# Patient Record
Sex: Male | Born: 1960 | Race: White | Hispanic: No | Marital: Married | Smoking: Former smoker
Health system: Southern US, Community
[De-identification: ages and names within clinical notes are randomized; demographics above are authoritative.]

## PROBLEM LIST (undated history)

## (undated) DIAGNOSIS — M5489 Other dorsalgia: Secondary | ICD-10-CM

## (undated) DIAGNOSIS — N189 Chronic kidney disease, unspecified: Secondary | ICD-10-CM

## (undated) DIAGNOSIS — I10 Essential (primary) hypertension: Secondary | ICD-10-CM

## (undated) DIAGNOSIS — Z9289 Personal history of other medical treatment: Secondary | ICD-10-CM

## (undated) DIAGNOSIS — E785 Hyperlipidemia, unspecified: Secondary | ICD-10-CM

## (undated) DIAGNOSIS — R202 Paresthesia of skin: Secondary | ICD-10-CM

## (undated) DIAGNOSIS — J189 Pneumonia, unspecified organism: Secondary | ICD-10-CM

## (undated) DIAGNOSIS — Z8709 Personal history of other diseases of the respiratory system: Secondary | ICD-10-CM

## (undated) DIAGNOSIS — K5790 Diverticulosis of intestine, part unspecified, without perforation or abscess without bleeding: Secondary | ICD-10-CM

## (undated) DIAGNOSIS — R197 Diarrhea, unspecified: Secondary | ICD-10-CM

## (undated) DIAGNOSIS — D649 Anemia, unspecified: Secondary | ICD-10-CM

## (undated) DIAGNOSIS — D126 Benign neoplasm of colon, unspecified: Secondary | ICD-10-CM

## (undated) DIAGNOSIS — C4490 Unspecified malignant neoplasm of skin, unspecified: Secondary | ICD-10-CM

## (undated) DIAGNOSIS — T827XXA Infection and inflammatory reaction due to other cardiac and vascular devices, implants and grafts, initial encounter: Secondary | ICD-10-CM

## (undated) DIAGNOSIS — Z940 Kidney transplant status: Secondary | ICD-10-CM

## (undated) DIAGNOSIS — T8612 Kidney transplant failure: Secondary | ICD-10-CM

## (undated) DIAGNOSIS — M1030 Gout due to renal impairment, unspecified site: Secondary | ICD-10-CM

## (undated) DIAGNOSIS — R0602 Shortness of breath: Secondary | ICD-10-CM

## (undated) DIAGNOSIS — Z8739 Personal history of other diseases of the musculoskeletal system and connective tissue: Secondary | ICD-10-CM

## (undated) HISTORY — DX: Diverticulosis of intestine, part unspecified, without perforation or abscess without bleeding: K57.90

## (undated) HISTORY — DX: Infection and inflammatory reaction due to other cardiac and vascular devices, implants and grafts, initial encounter: T82.7XXA

## (undated) HISTORY — DX: Other dorsalgia: M54.89

## (undated) HISTORY — DX: Unspecified malignant neoplasm of skin, unspecified: C44.90

## (undated) HISTORY — DX: Chronic kidney disease, unspecified: N18.9

## (undated) HISTORY — DX: Kidney transplant failure: T86.12

## (undated) HISTORY — DX: Benign neoplasm of colon, unspecified: D12.6

## (undated) HISTORY — DX: Essential (primary) hypertension: I10

## (undated) HISTORY — PX: AV FISTULA PLACEMENT: SHX1204

---

## 1997-10-31 ENCOUNTER — Ambulatory Visit (HOSPITAL_COMMUNITY): Admission: RE | Admit: 1997-10-31 | Discharge: 1997-10-31 | Payer: Self-pay | Admitting: Cardiology

## 2003-02-12 ENCOUNTER — Ambulatory Visit (HOSPITAL_BASED_OUTPATIENT_CLINIC_OR_DEPARTMENT_OTHER): Admission: RE | Admit: 2003-02-12 | Discharge: 2003-02-12 | Payer: Self-pay | Admitting: Otolaryngology

## 2008-05-18 ENCOUNTER — Encounter: Admission: RE | Admit: 2008-05-18 | Discharge: 2008-05-18 | Payer: Self-pay | Admitting: Nephrology

## 2008-05-24 ENCOUNTER — Ambulatory Visit: Payer: Self-pay | Admitting: Vascular Surgery

## 2008-05-27 ENCOUNTER — Ambulatory Visit (HOSPITAL_COMMUNITY): Admission: RE | Admit: 2008-05-27 | Discharge: 2008-05-27 | Payer: Self-pay | Admitting: Vascular Surgery

## 2008-05-27 ENCOUNTER — Ambulatory Visit: Payer: Self-pay | Admitting: Vascular Surgery

## 2008-06-07 ENCOUNTER — Encounter: Admission: RE | Admit: 2008-06-07 | Discharge: 2008-06-07 | Payer: Self-pay | Admitting: Nephrology

## 2008-07-05 ENCOUNTER — Ambulatory Visit: Payer: Self-pay | Admitting: Vascular Surgery

## 2008-07-27 ENCOUNTER — Encounter (HOSPITAL_COMMUNITY): Admission: RE | Admit: 2008-07-27 | Discharge: 2008-10-25 | Payer: Self-pay | Admitting: Nephrology

## 2008-09-01 ENCOUNTER — Ambulatory Visit (HOSPITAL_COMMUNITY): Admission: RE | Admit: 2008-09-01 | Discharge: 2008-09-01 | Payer: Self-pay | Admitting: Nephrology

## 2008-10-13 ENCOUNTER — Encounter: Admission: RE | Admit: 2008-10-13 | Discharge: 2008-10-13 | Payer: Self-pay | Admitting: General Surgery

## 2008-11-01 ENCOUNTER — Ambulatory Visit (HOSPITAL_COMMUNITY): Admission: RE | Admit: 2008-11-01 | Discharge: 2008-11-01 | Payer: Self-pay | Admitting: Nephrology

## 2009-01-08 HISTORY — PX: HERNIA REPAIR: SHX51

## 2009-01-08 HISTORY — PX: APPENDECTOMY: SHX54

## 2009-01-26 ENCOUNTER — Encounter (INDEPENDENT_AMBULATORY_CARE_PROVIDER_SITE_OTHER): Payer: Self-pay | Admitting: General Surgery

## 2009-01-26 ENCOUNTER — Ambulatory Visit (HOSPITAL_COMMUNITY): Admission: RE | Admit: 2009-01-26 | Discharge: 2009-01-27 | Payer: Self-pay | Admitting: General Surgery

## 2009-04-10 ENCOUNTER — Encounter: Admission: RE | Admit: 2009-04-10 | Discharge: 2009-04-10 | Payer: Self-pay | Admitting: Nephrology

## 2009-04-10 HISTORY — PX: KIDNEY TRANSPLANT: SHX239

## 2009-04-17 ENCOUNTER — Encounter: Admission: RE | Admit: 2009-04-17 | Discharge: 2009-04-17 | Payer: Self-pay | Admitting: Nephrology

## 2010-09-15 LAB — DIFFERENTIAL
Lymphocytes Relative: 22 % (ref 12–46)
Lymphs Abs: 1.7 10*3/uL (ref 0.7–4.0)
Monocytes Relative: 14 % — ABNORMAL HIGH (ref 3–12)
Neutro Abs: 4.4 10*3/uL (ref 1.7–7.7)
Neutrophils Relative %: 58 % (ref 43–77)

## 2010-09-15 LAB — POCT I-STAT 4, (NA,K, GLUC, HGB,HCT)
Glucose, Bld: 108 mg/dL — ABNORMAL HIGH (ref 70–99)
HCT: 38 % — ABNORMAL LOW (ref 39.0–52.0)
Hemoglobin: 12.9 g/dL — ABNORMAL LOW (ref 13.0–17.0)
Potassium: 4.1 mEq/L (ref 3.5–5.1)
Sodium: 135 mEq/L (ref 135–145)

## 2010-09-15 LAB — BASIC METABOLIC PANEL
BUN: 47 mg/dL — ABNORMAL HIGH (ref 6–23)
BUN: 63 mg/dL — ABNORMAL HIGH (ref 6–23)
Calcium: 8.9 mg/dL (ref 8.4–10.5)
Chloride: 101 mEq/L (ref 96–112)
Chloride: 99 mEq/L (ref 96–112)
Creatinine, Ser: 7.2 mg/dL — ABNORMAL HIGH (ref 0.4–1.5)
Creatinine, Ser: 8.58 mg/dL — ABNORMAL HIGH (ref 0.4–1.5)
GFR calc Af Amer: 10 mL/min — ABNORMAL LOW (ref >60)
GFR calc Af Amer: 8 mL/min — ABNORMAL LOW (ref >60)
GFR calc non Af Amer: 7 mL/min — ABNORMAL LOW (ref >60)
GFR calc non Af Amer: 8 mL/min — ABNORMAL LOW (ref >60)

## 2010-09-15 LAB — CBC
Platelets: 270 10*3/uL (ref 150–400)
RBC: 3.46 MIL/uL — ABNORMAL LOW (ref 4.22–5.81)
WBC: 7.6 10*3/uL (ref 4.0–10.5)

## 2010-09-20 LAB — RENAL FUNCTION PANEL
Albumin: 3.2 g/dL — ABNORMAL LOW (ref 3.5–5.2)
Phosphorus: 6.6 mg/dL — ABNORMAL HIGH (ref 2.3–4.6)
Potassium: 4.6 mEq/L (ref 3.5–5.1)
Sodium: 139 mEq/L (ref 135–145)

## 2010-09-20 LAB — PROTIME-INR
INR: 1 (ref 0.00–1.49)
Prothrombin Time: 13.2 seconds (ref 11.6–15.2)

## 2010-09-20 LAB — BASIC METABOLIC PANEL
CO2: 22 mEq/L (ref 19–32)
Calcium: 9 mg/dL (ref 8.4–10.5)
Creatinine, Ser: 8.19 mg/dL — ABNORMAL HIGH (ref 0.4–1.5)
GFR calc Af Amer: 9 mL/min — ABNORMAL LOW (ref >60)
GFR calc non Af Amer: 7 mL/min — ABNORMAL LOW (ref >60)

## 2010-09-20 LAB — CBC
MCHC: 33.4 g/dL (ref 30.0–36.0)
RBC: 3.28 MIL/uL — ABNORMAL LOW (ref 4.22–5.81)

## 2010-09-20 LAB — APTT: aPTT: 30 seconds (ref 24–37)

## 2010-09-20 LAB — PTH, INTACT AND CALCIUM
Calcium, Total (PTH): 8.9 mg/dL (ref 8.4–10.5)
PTH: 152.9 pg/mL — ABNORMAL HIGH (ref 14.0–72.0)

## 2010-09-20 LAB — POCT HEMOGLOBIN-HEMACUE: Hemoglobin: 10 g/dL — ABNORMAL LOW (ref 13.0–17.0)

## 2010-09-20 LAB — IRON AND TIBC: TIBC: 286 ug/dL (ref 215–435)

## 2010-09-25 LAB — POCT HEMOGLOBIN-HEMACUE: Hemoglobin: 9 g/dL — ABNORMAL LOW (ref 13.0–17.0)

## 2010-10-23 NOTE — Op Note (Signed)
Mario Woods, Mario Woods                 ACCOUNT NO.:  [id number]   MEDICAL RECORD NO.:  [id number]          PATIENT TYPE:  AMB   LOCATION:  SDS                          FACILITY:  [facility]   PHYSICIAN:  Judeth Cornfield. Scot Dock, M.D.DATE OF BIRTH:  February 24, 1961   DATE OF PROCEDURE:  05/27/2008  DATE OF DISCHARGE:  05/27/2008                               OPERATIVE REPORT   PREOPERATIVE DIAGNOSIS:  End-stage renal disease.   POSTOPERATIVE DIAGNOSIS:  End-stage renal disease.   PROCEDURE:  Placement of a new left forearm arteriovenous fistula.   SURGEON:  Judeth Cornfield. Scot Dock, MD   ASSISTANT:  Chad Cordial, PA.   ANESTHESIA:  Local.   TECHNIQUE:  The patient was taken to the operating room, sedated by  Anesthesia.  The left upper extremity was prepped and draped in the  usual sterile fashion.  After the skin was infiltrated with 1%  lidocaine, a longitudinal incision was made over the cephalic vein at  the wrist and was somewhat lateral.  The vein was ligated distally,  irrigated up with heparinized saline, was a 3.5 mm vein.  Next, through  a separate longitudinal incision as again the vein was fairly far  lateral, the radial artery was dissected free.  This was a somewhat  small, but it was felt to be adequate.  Therefore, the vein was tunneled  between the 2 incisions.  The patient was heparinized.  The radial  artery was clamped proximally and distally and a longitudinal  arteriotomy was made.  The vein was sewn end-to-side to the artery using  continuous 6-0 Prolene suture.  At the completion, there was a good  thrill in the fistula at the wrist.  Hemostasis was obtained in the  wounds.  The wounds were closed with a deep layer of 3-0 Vicryl, the  skin closed with 4-0 Vicryl.  Sterile dressing was applied.  The patient  tolerated the procedure well, was transferred to the recovery room in  satisfactory condition.  All needle and sponge counts were correct.      Judeth Cornfield.  Scot Dock, M.D.  Electronically Signed     CSD/MEDQ  D:  05/27/2008  T:  05/27/2008  Job:  [id number]

## 2010-10-23 NOTE — Assessment & Plan Note (Signed)
OFFICE VISIT   [name], Mario  DOB:  02-Nov-1960                                       07/05/2008  CHART#:[id number]   I saw the patient in the office today for followup after placement of a  left forearm AV fistula on 05/27/2008.  He comes in for a routine wound  check.  He has had no problems since his surgery.  His incision is  healing well and he has an excellent thrill and the left forearm AV  fistula appears to be maturing nicely.  I think this should be ready to  use if and when he needs it for dialysis.  We will see him back p.r.n.   Judeth Cornfield. Scot Woods, M.D.  Electronically Signed   CSD/MEDQ  D:  07/05/2008  T:  07/06/2008  Job:  [id number]

## 2010-10-23 NOTE — Op Note (Signed)
Mario Woods, DAVISON NO.:  [id number]   MEDICAL RECORD NO.:  [id number]          PATIENT TYPE:  OIB   LOCATION:  [id number]                         FACILITY:  [facility]   PHYSICIAN:  Dickey Gave, MDDATE OF BIRTH:  1960-08-10   DATE OF PROCEDURE:  01/26/2009  DATE OF DISCHARGE:                               OPERATIVE REPORT   PREOPERATIVE DIAGNOSES:  1. Ventral hernia.  2. Dilated appendix on CT scan.   POSTOPERATIVE DIAGNOSES:  1. Supraumbilical ventral hernia.  2. 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   PROCEDURE:  1. Diagnostic laparoscopy.  2. Laparoscopic appendectomy.  3. Primary ventral hernia repair x2.   SURGEON:  Dickey Gave, MD   ASSISTANT:  None.   ANESTHESIA:  General.   FINDINGS:  Dilated appendix especially in the distal one-third.   ESTIMATED BLOOD LOSS:  Minimal.   COMPLICATIONS:  None.   SUPERVISING ANESTHESIOLOGIST:  Crissie Sickles. Conrad O[place], MD   DISPOSITION:  To recovery room in stable condition.   INDICATIONS:  Mr. 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  He has been dialyzed  through the left forearm AV fistula.  He also has a 3- to 4-year history  of a supraumbilical bulge that has increased in size over this time.  He  also has a history of some nausea, vomiting, and significant pain at  this site, as well as a possible episode of incarceration.  It was  difficult to tell where this area was exactly as well as a diastasis  recti.  So, I sent him for a CT scan to evaluate this area.  He indeed  had a supraumbilical ventral hernia with a neck measuring about 2-3 cm  but also had a dilated appendix to 14 mm concerning for a mucocele.  He  also has a history of an expansile lesion at left sacrum noted on the  scan that has been evaluated by Dr. Josefa Half at [facility] for the  last 10 years as well.  He is currently undergoing a transplant  evaluation.   He, I had him scheduled previously for surgery and then I  have called him to return due to concern about his appendix.  On December 29, 2008, we scheduled him for a diagnostic laparoscopy, possible  appendectomy versus ileocecectomy and a ventral hernia repair.  He and I  discussed the ventral hernia repair and it may very well be done  primarily with biologic mesh giving him a much higher recurrence rate  but did not want to increase the infectious rate given the bowel surgery  that would be ongoing at the same time as well.   PROCEDURE:  After informed consent was obtained, the patient was taken  to the operating room.  He had sequential compression devices placed on  his lower extremities prior to operation.  Cefazolin 1 gram was  administered prior to the procedure.  He was then placed under general  endotracheal anesthesia without complication.  He did have a Foley  catheter placed  just to decompress his bladder for the ports and there  was return of urine from this.  His abdomen was prepped with ChloraPrep.  Three minutes was allowed to pass.  He was then draped in a standard  sterile surgical fashion.  Surgical time-out was then performed.   Due to the position of his hernia, I made a vertical incision above the  umbilicus and carried out down to the level of the umbilical stalk.  I  then grasped this with a Kocher clamp, made an incision in the fascia  and entered the peritoneum bluntly.  A 0 Vicryl pursestring suture was  placed through the fascia.  A Hasson trocar was then introduced into the  abdomen without complication. I then insufflated his abdomen to 15 mmHg  pressure.  Two further 5-mm ports were placed in the right upper  quadrant as well as in the suprapubic position under direct vision  without complication after infiltration with local anesthetic.  I then  looked to evaluate his right lower quadrant, terminal ileum and cecum  both appeared normal.  His appendix was   stuck in the right lower  quadrant.  I was able to manipulate this laparoscopically without  rupturing or grasping the appendix during this portion of the procedure.  I was able to identify the base, and I encircled this with a Air traffic controller.  I then inserted a GIA stapler through the 123456 umbilical  port and removed the appendix as well as a small portion of the cecum  where my staple line was.  I took two staple fires to remove this.  Following this, I then used the vascular loading twice to come across  the mesentery.  There was a small amount of bleeding in the mesentery  that was controlled with a clip.  The appendix was then placed in a bag  and removed down the umbilical incision.  I looked at this on the back  table, and this certainly did appear to be a mucocele.  I changed my  gloves following this portion of the procedure and then returned to the  table.  I had this evaluated by Dr. Alinda Sierras of Pathology who agreed  this was likely a cystic mucinous neoplasm.  I elected to just wait for  the permanent pathology and proceed with just performing the  appendectomy at this time.  Irrigation was performed.  Hemostasis was  observed at this position.  I then turned my attention to the ventral  hernias.  I enlarged this incision first with Hasson trocar.  He had two  ventral hernias, one was about 2.5-cm in size and one was about a 1.5 cm  in size.  These were separate and were both in the supraumbilical  position.  I then was hesitant to place a mesh given the appendectomy as  well as a variety of other factors, so I mobilized these areas and  brought them both together primarily with a #1 Ethibond suture.  The  Hasson trocar site was closed with the Vicryl suture.  An additional  Vicryl figure-of-eight suture was placed through this as well.  I then  reinsufflated the abdomen, and there was no evidence of any injury where  I fixed this hernia and no bowel entrapped as well  as this appeared to  be an adequate hernia repair.  I then desufflated his abdomen and  removed all my trocars.  His skin incisions were all closed with a 4-0  Monocryl  in a subcuticular fashion.  His Foley catheter was removed.  He  tolerated this well was extubated in the operating room and was  transferred to the recovery room in stable condition.      Dickey Gave, MD  Electronically Signed     MCW/MEDQ  D:  01/26/2009  T:  01/27/2009  Job:  [id number]   cc:   Elzie Rings. Lorrene Reid, M.D.

## 2010-10-23 NOTE — Procedures (Signed)
CEPHALIC VEIN MAPPING   INDICATION:  Preop AVF map.   HISTORY:  Chronic kidney disease.   EXAM:  The right cephalic vein is not evaluated.   The left cephalic vein is compressible.   Diameter measurements range from 0.35 cm to 0.86 cm.   See attached worksheet for all measurements.   IMPRESSION:  Patent left cephalic vein which is of acceptable diameter  for use as a dialysis access site.   ___________________________________________  Judeth Cornfield. Scot Dock, M.D.   AS/MEDQ  D:  05/24/2008  T:  05/24/2008  Job:  [id number]

## 2010-10-23 NOTE — Consult Note (Signed)
VASCULAR SURGERY CONSULTATION   Mario Woods, Mario Woods  DOB:  1961-03-06                                       05/24/2008  CHART#:[id number]   I saw the patient in the office today in consultation for hemodialysis  access.  He was referred by Dr. Lorrene Reid.  This is a pleasant 50 year old  gentleman with end-stage renal disease secondary to IgA nephropathy.  It  is felt that he will likely need dialysis in the near future and we were  asked to evaluate him for access.  Of note, he is right-handed.  He has  had no recent uremic symptoms.  Specifically he denies any nausea,  vomiting or significant shortness of breath.   PAST MEDICAL HISTORY:  His past medical history is significant for his  IgA nephropathy, hypertension and dyslipidemia.  He also has a history  of gout.   REVIEW OF SYSTEMS AND MEDICATIONS:  His review of systems and  medications are documented on the medical history form in his chart.   PHYSICAL EXAMINATION:  General:  This is a pleasant 50 year old  gentleman who appears his stated age.  Vital signs:  His blood pressure  is 116/77, heart rate is 68.  Lungs:  Are clear bilaterally to  auscultation.  Cardiac:  He has a regular rate and rhythm.  He has a  palpable brachial and radial pulse bilaterally.   He did undergo vein mapping in our office today which shows a reasonable  forearm and upper arm cephalic vein in the left arm.   I have recommended we place a left forearm AV fistula.  If this is not  adequate then we would place an upper arm fistula.  If neither are  adequate we will place an AV graft.  I have discussed the indications  for surgery and the potential complications including but not limited to  bleeding, wound healing problems, failure of the fistula to mature,  graft thrombosis, graft infection and steal syndrome.  All his questions  were answered.  He is agreeable to proceed and his surgery has been  scheduled for this Friday,  05/27/2008.   Judeth Cornfield. Scot Dock, M.D.  Electronically Signed  CSD/MEDQ  D:  05/24/2008  T:  05/25/2008  Job:  [id number]   cc:   Elzie Rings. Lorrene Reid, M.D.

## 2011-01-30 IMAGING — XA IR FLUORO GUIDE CV LINE*R*
1 series · 2 of 2 positions shown · non-contrast
Comparison: none

Clinical Data/Indication: HD CATH PLACEMENT. .

TUNNELED DIALYSIS CATHETER PLACEMENT, ULTRASOUND GUIDANCE FOR
VASCULAR ACCESS
Sedation: Versed 2.5 mg, Fentanyl 125 mcg.
Total Moderate Sedation Time: 17 minutes.
Fluoroscopy Time: 1.6 minutes.
Procedure: The procedure, risks, benefits, and alternatives were
explained to the patient. Questions regarding the procedure were
encouraged and answered. The patient understands and consents to
the procedure.
The right neck was prepped withbetadine in a sterile fasion, and a
sterile drape was applied covering the operative field. A sterile
gown and sterile gloves were used for the procedure. 1% lidocaine
into the skin and subcutaneous tissue.  Under sonographic guidance,
a micropuncture needle was inserted into the right IJ vein, which
was noted to be patent.  It was removed over an 018 wire which was
upsized to an Amplatz.  This was advanced into the IVC.
A small incision was made in the right upper chest.  The tunneling
device was utilized to advance the86 centimeter tip to cuff
catheter from the chest incision and out the neck incision.  A peel-
away sheath was advanced over the Amplatz wire.  The leading edge
of the catheter was then advanced through the peel-away sheath.
The peel-away sheath was removed.  It was flushed and instilled
with heparin.  The chest incision was closed with an 0 Prolene
pursestring stitch.  The neck incision was closed with a 4-0 Vicryl
subcuticular stitch.  No complications.

[Series 1: run · 2 of 2 slices shown]
[im 1/2]
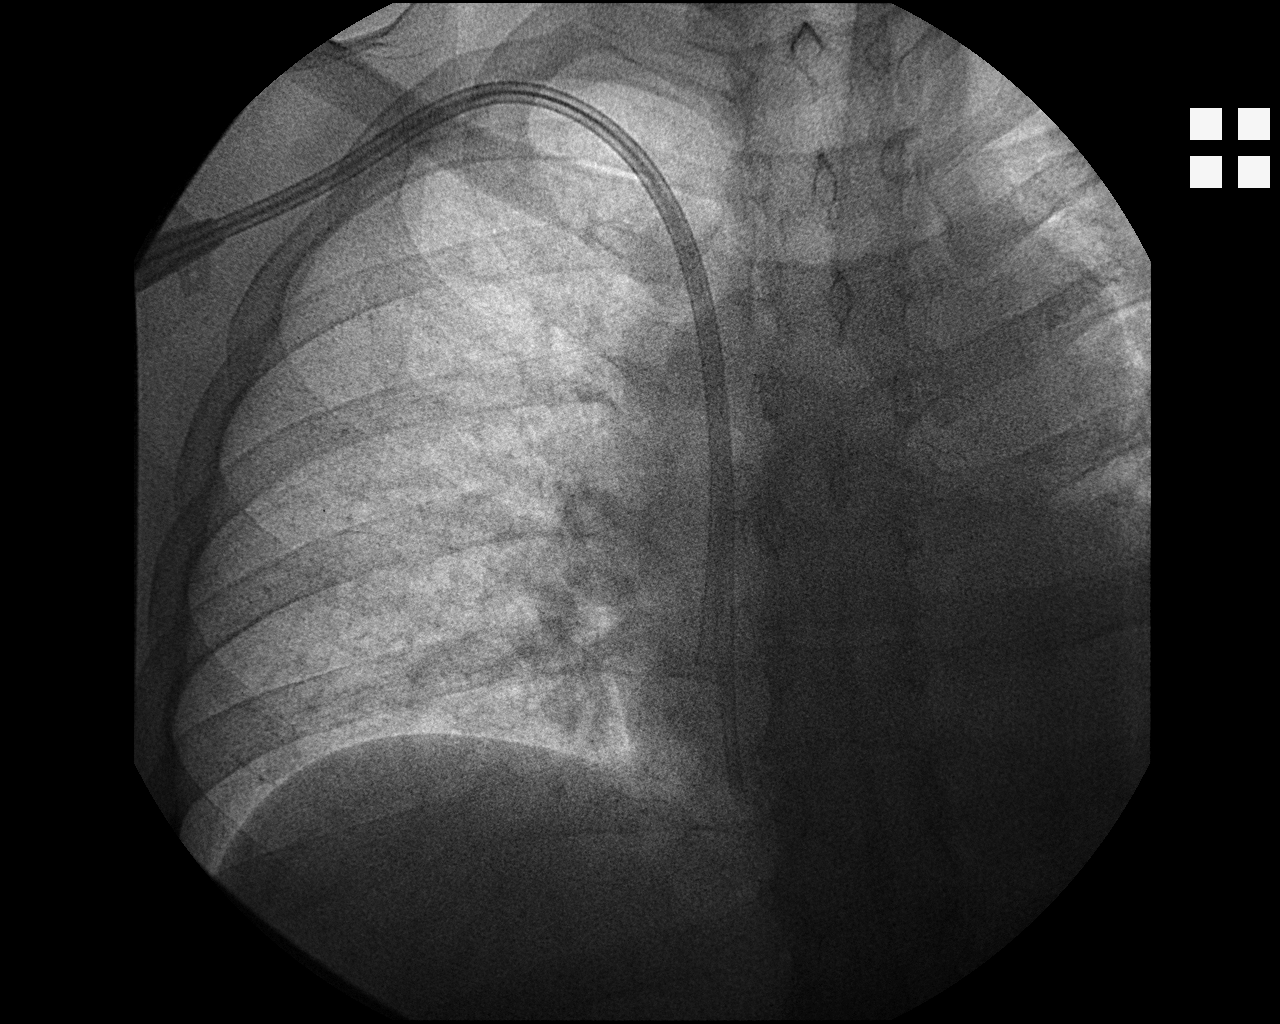
[im 2/2]
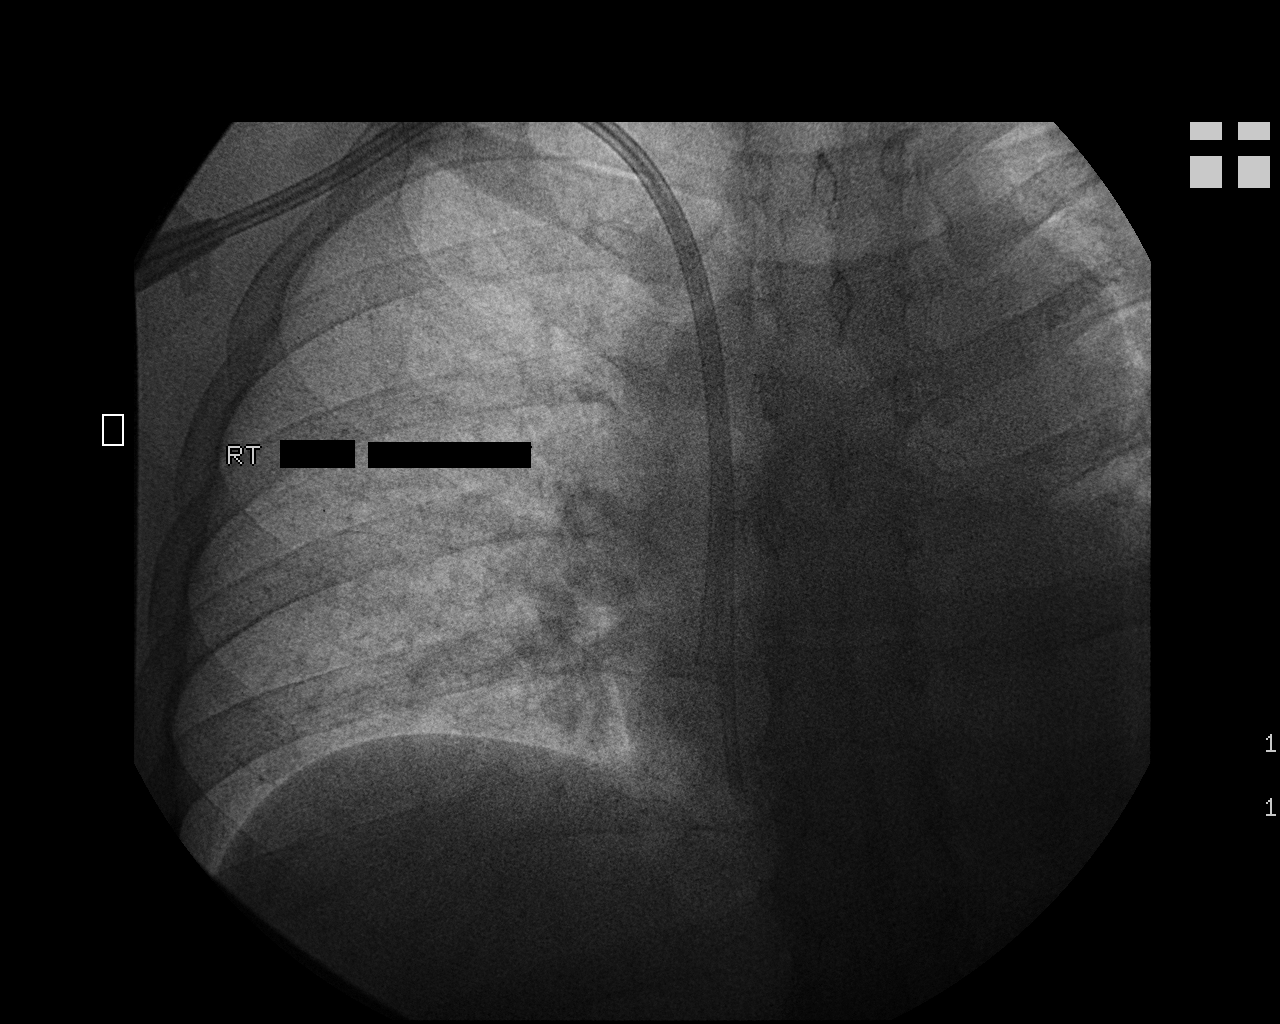

[2 of 2 positions shown; findings below may reference images not displayed]

FINDINGS: The image demonstrates placement of a tunneled dialysis
catheter with its tip in the right atrium.
IMPRESSION: Successful right IJ vein tunneled dialysis catheter with its tip in
the right atrium.

## 2011-08-15 ENCOUNTER — Encounter (INDEPENDENT_AMBULATORY_CARE_PROVIDER_SITE_OTHER): Payer: Self-pay | Admitting: General Surgery

## 2011-08-15 ENCOUNTER — Ambulatory Visit (INDEPENDENT_AMBULATORY_CARE_PROVIDER_SITE_OTHER): Payer: BC Managed Care – PPO | Admitting: General Surgery

## 2011-08-15 VITALS — BP 148/82 | HR 76 | Resp 20 | Ht 77.5 in | Wt 285.0 lb

## 2011-08-15 DIAGNOSIS — K432 Incisional hernia without obstruction or gangrene: Secondary | ICD-10-CM

## 2011-08-15 NOTE — Progress Notes (Signed)
Patient ID: Mario Woods., male   DOB: [birth year], 51 y.o.   MRN: [id number]  Chief Complaint  Patient presents with  . Umbilical Hernia    HPI Mario Woods. is a 51 y.o. male.   HPI This is a 51 year old male who I know well from 2010. At that point in time he had end-stage renal disease and was being considered for a transplant. I then took him to the operating room as he had an appendiceal mass and did a laparoscopic appendectomy which showed a benign appendix with focal dilatation of the lumen with associated mucin. There was no definite neoplasm identified. At that time I also repaired 2 ventral hernias in his abdominal wall with suture as I did not want to place mesh at that same time. Since then he has received a renal transplant in November of 2010 at [facility] for which he is doing very well. He now is back to work. His kidney is given him no problems since then. He about a year and a half ago had return of the bulge near his prior ventral hernia repair that has gotten bigger over this time. He is having normal bowel movements and has no nausea or vomiting associated with this. It does bother him and give him some discomfort if he overeats or when he exerts himself. He presents again today to discuss repair.  Past Medical History  Diagnosis Date  . Hypertension   . Kidney disease     Past Surgical History  Procedure Date  . Appendectomy 01/2009  . Kidney transplant 04/2009  . Hernia repair 01/2009    primary ventral hernia repair x2     Family History  Problem Relation Age of Onset  . Pancreatic cancer      Social History History  Substance Use Topics  . Smoking status: Never Smoker   . Smokeless tobacco: Not on file  . Alcohol Use: Yes     occasionally    No Known Allergies  Current Outpatient Prescriptions  Medication Sig Dispense Refill  . carvedilol (COREG) 12.5 MG tablet Take 12.5 mg by mouth 2 (two) times daily with a meal.      . mycophenolate  (CELLCEPT) 250 MG capsule Take by mouth 2 (two) times daily.      . tacrolimus (PROGRAF) 1 MG capsule Take 1 mg by mouth 2 (two) times daily.        Review of Systems Review of Systems  Constitutional: Negative for fever, chills and unexpected weight change.  HENT: Negative for hearing loss, congestion, sore throat, trouble swallowing and voice change.   Eyes: Negative for visual disturbance.  Respiratory: Negative for cough and wheezing.   Cardiovascular: Negative for chest pain, palpitations and leg swelling.  Gastrointestinal: Positive for abdominal pain and abdominal distention. Negative for nausea, vomiting, diarrhea, constipation, blood in stool, anal bleeding and rectal pain.  Genitourinary: Negative for hematuria and difficulty urinating.  Musculoskeletal: Negative for arthralgias.  Skin: Negative for rash and wound.  Neurological: Negative for seizures, syncope, weakness and headaches.  Hematological: Negative for adenopathy. Does not bruise/bleed easily.  Psychiatric/Behavioral: Negative for confusion.    Blood pressure 148/82, pulse 76, resp. rate 20, height 6' 5.5" (1.969 m), weight 285 lb (129.275 kg).  Physical Exam Physical Exam  Vitals reviewed. Constitutional: He appears well-developed and well-nourished.  Neck: Neck supple.  Cardiovascular: Normal rate, regular rhythm and normal heart sounds.   Pulmonary/Chest: Effort normal and breath sounds normal. He has  no wheezes. He has no rales.  Abdominal: Soft. Bowel sounds are normal. There is no tenderness.    Lymphadenopathy:    He has no cervical adenopathy.      Assessment    Recurrent ventral hernia    Plan   I am not surprised that he has a recurrent ventral hernia. I discussed with him today what I recommended being a laparoscopic ventral hernia repair with mesh. I told him I was comfortable repairing this in [place] as long as I had discussed this with his transplant physicians for perioperative  recommendations. I discussed the lateral hernia with mesh. We discussed the surgery, postoperative course, and hospital stay associated with this. We discussed at length the risks including bleeding, infection, recurrent hernia, injury to bowel or other structures necessitating not fixing this hernia at the same time, prolonged ileus, as well as problems with his transplanted kidney. I will await to hear back from his transplant physicians and scheduling.        Mario Woods 08/15/2011, 9:47 AM

## 2011-09-18 ENCOUNTER — Other Ambulatory Visit (INDEPENDENT_AMBULATORY_CARE_PROVIDER_SITE_OTHER): Payer: Self-pay | Admitting: General Surgery

## 2011-09-20 ENCOUNTER — Telehealth (INDEPENDENT_AMBULATORY_CARE_PROVIDER_SITE_OTHER): Payer: Self-pay

## 2011-09-20 NOTE — Telephone Encounter (Signed)
LMOM for pt to call me about his surgery.

## 2011-09-20 NOTE — Telephone Encounter (Signed)
Rock Nephew the abdominal organ transplant coordinator returned my call and she notified us that the pt does not have to change any med's before surgery. The only advice she would recommend would be for the pt to see his nephrologist 2wks after surgery for them to get routine labs on the pt to follow his creatnine after surgery. Dr Donne Hazel was notified of the recommendations.

## 2011-12-19 ENCOUNTER — Encounter (HOSPITAL_COMMUNITY): Admission: RE | Payer: Self-pay | Source: Ambulatory Visit

## 2011-12-19 ENCOUNTER — Ambulatory Visit (HOSPITAL_COMMUNITY): Admission: RE | Admit: 2011-12-19 | Payer: 59 | Source: Ambulatory Visit | Admitting: General Surgery

## 2011-12-19 SURGERY — REPAIR, HERNIA, VENTRAL, LAPAROSCOPIC
Anesthesia: General

## 2012-01-08 ENCOUNTER — Encounter (HOSPITAL_COMMUNITY): Payer: Self-pay | Admitting: Pharmacy Technician

## 2012-01-08 NOTE — Progress Notes (Signed)
Patient preop appointment is on 01/13/12 at [facility] 01/21/12.  Need orders placed in EPIC .  Thanks.

## 2012-01-13 ENCOUNTER — Inpatient Hospital Stay (HOSPITAL_COMMUNITY): Admission: RE | Admit: 2012-01-13 | Payer: 59 | Source: Ambulatory Visit

## 2012-01-16 ENCOUNTER — Encounter (INDEPENDENT_AMBULATORY_CARE_PROVIDER_SITE_OTHER): Payer: Self-pay | Admitting: General Surgery

## 2012-01-16 ENCOUNTER — Ambulatory Visit (INDEPENDENT_AMBULATORY_CARE_PROVIDER_SITE_OTHER): Payer: 59 | Admitting: General Surgery

## 2012-01-16 DIAGNOSIS — K432 Incisional hernia without obstruction or gangrene: Secondary | ICD-10-CM

## 2012-01-16 NOTE — Progress Notes (Signed)
Subjective:     Patient ID: Mario Chars., male   DOB: 15-Nov-1960, 51 y.o.   MRN: [id number]  HPI This is a 51 year old male who I know well from 2010. At that point in time he had end-stage renal disease and was being considered for a transplant. I then took him to the operating room as he had an appendiceal mass and did a laparoscopic appendectomy which showed a benign appendix with focal dilatation of the lumen with associated mucin. There was no definite neoplasm identified. At that time I also repaired 2 ventral hernias in his abdominal wall with suture as I did not want to place mesh at that same time. Since then he has received a renal transplant in November of 2010 at [facility] for which he is doing very well.  He had a return of the bulge near his prior ventral hernia repair that has gotten bigger over this time. He is having normal bowel movements and has no nausea or vomiting associated with this. It does bother him and give him some discomfort if he overeats or when he exerts himself. I had seen him previously and we had scheduled repair but due to moving he has cancelled. He comes in today for exam and to discuss surgery.  He reports no real changes.   Review of Systems     Objective:   Physical Exam Reducible nontender ventral hernia above umbilicus    Assessment:     Recurrent hernia    Plan:     We again discussed laparoscopic ventral hernia.  We discussed risks, benefits and postoperative course and restrictions.  He is going to call me when he is able to schedule.

## 2012-01-21 ENCOUNTER — Encounter (HOSPITAL_COMMUNITY): Admission: RE | Payer: Self-pay | Source: Ambulatory Visit

## 2012-01-21 ENCOUNTER — Ambulatory Visit (HOSPITAL_COMMUNITY): Admission: RE | Admit: 2012-01-21 | Payer: 59 | Source: Ambulatory Visit | Admitting: General Surgery

## 2012-01-21 SURGERY — REPAIR, HERNIA, VENTRAL, LAPAROSCOPIC
Anesthesia: General

## 2012-02-14 ENCOUNTER — Encounter: Payer: Self-pay | Admitting: Cardiology

## 2012-02-17 ENCOUNTER — Encounter: Payer: Self-pay | Admitting: Cardiology

## 2012-03-24 ENCOUNTER — Telehealth (INDEPENDENT_AMBULATORY_CARE_PROVIDER_SITE_OTHER): Payer: Self-pay | Admitting: General Surgery

## 2012-03-24 ENCOUNTER — Other Ambulatory Visit (INDEPENDENT_AMBULATORY_CARE_PROVIDER_SITE_OTHER): Payer: Self-pay | Admitting: General Surgery

## 2012-03-24 NOTE — Telephone Encounter (Signed)
Message copied by Juanita Laster on Tue Mar 24, 2012 11:02 AM ------      Message from: [facility], [place]      Created: Tue Mar 24, 2012  9:21 AM      Regarding: RE: pt ready to sch surgery       I will put orders in but I will need to see him back before I operate on him      ----- Message -----         From: Overton Mam         Sent: 03/23/2012   5:52 PM           To: [name], MD, Asa Lente, #      Subject: pt ready to sch surgery                                  Pt was seen in August and is now ready to sch his surgery.  Do you wish to see him again or can you send orders over via Epic?            Thanks

## 2012-03-24 NOTE — Telephone Encounter (Signed)
Spoke with pt and informed him that Dr. Donne Hazel will need to see him in the office before his surgery date.  I made him an appt for 10/28 at 10:30.  He was okay with this.

## 2012-04-06 ENCOUNTER — Encounter (INDEPENDENT_AMBULATORY_CARE_PROVIDER_SITE_OTHER): Payer: Self-pay | Admitting: General Surgery

## 2012-04-06 ENCOUNTER — Ambulatory Visit (INDEPENDENT_AMBULATORY_CARE_PROVIDER_SITE_OTHER): Payer: 59 | Admitting: General Surgery

## 2012-04-06 VITALS — BP 119/68 | HR 74 | Temp 97.8°F | Resp 14 | Ht 77.0 in | Wt 295.0 lb

## 2012-04-06 DIAGNOSIS — K432 Incisional hernia without obstruction or gangrene: Secondary | ICD-10-CM

## 2012-04-06 NOTE — Progress Notes (Signed)
Patient ID: Mario Blomme., male   DOB: 09-25-1960, 51 y.o.   MRN: [id number]  Chief Complaint  Patient presents with  . Follow-up    HPI Mario Woods. is a 51 y.o. male.  HPI This is a 51 year old male who I know well from 2010. At that point in time he had end-stage renal disease and was being considered for a transplant. I then took him to the operating room as he had an appendiceal mass and did a laparoscopic appendectomy which showed a benign appendix with focal dilatation of the lumen with associated mucin. There was no definite neoplasm identified. At that time I also repaired 2 ventral hernias in his abdominal wall with suture as I did not want to place mesh at that same time. Since then he has received a renal transplant in November of 2010 at [facility] for which he is doing very well. He had a return of the bulge near his prior ventral hernia repair that has gotten bigger over this time. He is having normal bowel movements and has no nausea or vomiting associated with this. It does bother him and give him some discomfort if he overeats or when he exerts himself. This is had no change since I last seen him. He is rescheduled multiple times and now he appears to be ready to go.  Past Medical History  Diagnosis Date  . Hypertension   . Kidney disease     Past Surgical History  Procedure Date  . Appendectomy 01/2009  . Kidney transplant 04/2009  . Hernia repair 01/2009    primary ventral hernia repair x2     Family History  Problem Relation Age of Onset  . Pancreatic cancer      Social History History  Substance Use Topics  . Smoking status: Never Smoker   . Smokeless tobacco: Not on file  . Alcohol Use: Yes     occasionally    No Known Allergies  Current Outpatient Prescriptions  Medication Sig Dispense Refill  . aspirin 81 MG chewable tablet Chew 81 mg by mouth every morning.      . carvedilol (COREG) 12.5 MG tablet Take 12.5 mg by mouth 2 (two) times daily  with a meal.      . cholecalciferol (VITAMIN D) 1000 UNITS tablet Take 1,000 Units by mouth daily.      . mycophenolate (CELLCEPT) 250 MG capsule Take 1,000 mg by mouth 2 (two) times daily.       . tacrolimus (PROGRAF) 1 MG capsule Take 2 mg by mouth 2 (two) times daily.         Review of Systems Review of Systems  Blood pressure 119/68, pulse 74, temperature 97.8 F (36.6 C), temperature source Temporal, resp. rate 14, height 6\' 5"  (1.956 m), weight 295 lb (133.811 kg).  Physical Exam Physical Exam  Reducible nontender ventral hernia above umbilicus   Assessment    Recurrent ventral hernia    Plan    We again discussed laparoscopic ventral hernia. We discussed risks, benefits and postoperative course and restrictions.         Ferd Horrigan 04/06/2012, 10:39 AM

## 2012-04-14 ENCOUNTER — Encounter (HOSPITAL_COMMUNITY): Payer: Self-pay | Admitting: Pharmacy Technician

## 2012-04-16 ENCOUNTER — Inpatient Hospital Stay (HOSPITAL_COMMUNITY): Admission: RE | Admit: 2012-04-16 | Payer: 59 | Source: Ambulatory Visit | Admitting: General Surgery

## 2012-04-16 ENCOUNTER — Encounter (HOSPITAL_COMMUNITY): Admission: RE | Payer: Self-pay | Source: Ambulatory Visit

## 2012-04-16 SURGERY — REPAIR, HERNIA, INCISIONAL, LAPAROSCOPIC
Anesthesia: General

## 2012-04-20 ENCOUNTER — Encounter (HOSPITAL_COMMUNITY)
Admission: RE | Admit: 2012-04-20 | Discharge: 2012-04-20 | Disposition: A | Discharge status: Home / Self Care | Payer: Medicare Other | Source: Ambulatory Visit | Attending: Anesthesiology | Admitting: Anesthesiology

## 2012-04-20 ENCOUNTER — Encounter (HOSPITAL_COMMUNITY)
Admission: RE | Admit: 2012-04-20 | Discharge: 2012-04-20 | Disposition: A | Discharge status: Home / Self Care | Payer: Medicare Other | Source: Ambulatory Visit | Attending: General Surgery | Admitting: General Surgery

## 2012-04-20 ENCOUNTER — Encounter (HOSPITAL_COMMUNITY): Payer: Self-pay

## 2012-04-20 HISTORY — DX: Kidney transplant status: Z94.0

## 2012-04-20 LAB — COMPREHENSIVE METABOLIC PANEL
AST: 25 U/L (ref 0–37)
Albumin: 3.7 g/dL (ref 3.5–5.2)
Alkaline Phosphatase: 86 U/L (ref 39–117)
BUN: 24 mg/dL — ABNORMAL HIGH (ref 6–23)
Chloride: 107 mEq/L (ref 96–112)
Potassium: 4.4 mEq/L (ref 3.5–5.1)
Total Bilirubin: 0.5 mg/dL (ref 0.3–1.2)
Total Protein: 6.8 g/dL (ref 6.0–8.3)

## 2012-04-20 LAB — CBC WITH DIFFERENTIAL/PLATELET
Basophils Absolute: 0.1 10*3/uL (ref 0.0–0.1)
Basophils Relative: 1 % (ref 0–1)
Eosinophils Absolute: 0.4 10*3/uL (ref 0.0–0.7)
MCH: 32.2 pg (ref 26.0–34.0)
MCHC: 34.1 g/dL (ref 30.0–36.0)
Neutro Abs: 4.8 10*3/uL (ref 1.7–7.7)
Neutrophils Relative %: 66 % (ref 43–77)
Platelets: 221 10*3/uL (ref 150–400)
RDW: 12.4 % (ref 11.5–15.5)

## 2012-04-20 LAB — SURGICAL PCR SCREEN
MRSA, PCR: NEGATIVE
Staphylococcus aureus: NEGATIVE

## 2012-04-20 LAB — URINALYSIS, ROUTINE W REFLEX MICROSCOPIC
Glucose, UA: NEGATIVE mg/dL
Hgb urine dipstick: NEGATIVE
Leukocytes, UA: NEGATIVE
Protein, ur: NEGATIVE mg/dL
pH: 5.5 (ref 5.0–8.0)

## 2012-04-20 NOTE — Progress Notes (Addendum)
Pt h/o kidney transplant at [facility] 2010. Follows up there and with Dr. Lorrene Reid at [facility]. Requested last OV Dr. Lorrene Reid and [facility].  Pt sees Dr. Doreatha Lew at [place]. Requested last OV and old EKG. Have ECHO and stress test in EPIC/on chart.    Dr. Doreatha Lew has retired, they are looking for old EKG.   Pt states he ran out of Coreg several days ago. Urged pt to get refill ASAP.   Will leave chart for anesthesia review of cardiac hx/EKG.

## 2012-04-20 NOTE — Pre-Procedure Instructions (Signed)
[place].  04/20/2012   Your procedure is scheduled on:  Monday November 18  Report to [place] at 11:00 AM.  Call this number if you have problems the morning of surgery: [phone]   Remember:   Do not eat or drink:After Midnight.    Take these medicines the morning of surgery with A SIP OF WATER: Carvedilol (Coreg), Cellcept, Prograf   Do not wear jewelry, make-up or nail polish.  Do not wear lotions, powders, or perfumes. You may wear deodorant.  Do not shave 48 hours prior to surgery. Men may shave face and neck.  Do not bring valuables to the hospital.  Contacts, dentures or bridgework may not be worn into surgery.  Leave suitcase in the car. After surgery it may be brought to your room.  For patients admitted to the hospital, checkout time is 11:00 AM the day of discharge.   Patients discharged the day of surgery will not be allowed to drive home.  Name and phone number of your driver: NA  Special Instructions: Shower using CHG 2 nights before surgery and the night before surgery.  If you shower the day of surgery use CHG.  Use special wash - you have one bottle of CHG for all showers.  You should use approximately 1/3 of the bottle for each shower.   Please read over the following fact sheets that you were given: Pain Booklet, Coughing and Deep Breathing and Surgical Site Infection Prevention

## 2012-04-22 NOTE — Consult Note (Signed)
Anesthesia Chart Review:  Patient is a 51 year old male posted for laparoscopic incisional hernia repair on 04/27/12 by Dr. Donne Hazel.  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  Nephrologist is Dr. Lorrene Reid.   EKG on 04/20/12 showed NRS.  Nuclear stress test on 10/06/08 was normal, showing no ischemia, EF 53%.   Echo on 09/29/08 showed moderate LVH, normal LV systolic function, EF 0000000, moderate LAE, mild RAE, mild aortic sclerosis, ST with mild septal dyssynergy, trace MR/TR.  CXR on 04/20/12 showed no acute cardiopulmonary findings.   Labs noted.  BUN/Cr 24/1.61.  Records from [facility] from August 2013 indicate that his baseline Cr has been around 1.5-1.58.  Anticipate he can proceed as planned.  Myra Gianotti, PA-C

## 2012-04-26 MED ORDER — CEFAZOLIN SODIUM-DEXTROSE 2-3 GM-% IV SOLR: 2.0000 g | INTRAVENOUS | Status: DC

## 2012-04-26 MED ORDER — DEXTROSE 5 % IV SOLN
3.0000 g | INTRAVENOUS | Status: DC
  Filled 2012-04-26: qty 3000

## 2012-04-27 ENCOUNTER — Encounter (HOSPITAL_COMMUNITY)
Admission: RE | Disposition: A | Discharge status: Home / Self Care | Payer: Self-pay | Source: Ambulatory Visit | Attending: General Surgery

## 2012-04-27 ENCOUNTER — Encounter (HOSPITAL_COMMUNITY): Payer: Self-pay | Admitting: Vascular Surgery

## 2012-04-27 ENCOUNTER — Observation Stay (HOSPITAL_COMMUNITY)
Admission: RE | Admit: 2012-04-27 | Discharge: 2012-04-30 | Disposition: A | Discharge status: Home / Self Care | Payer: Medicare Other | Source: Ambulatory Visit | Attending: General Surgery | Admitting: General Surgery

## 2012-04-27 ENCOUNTER — Ambulatory Visit (HOSPITAL_COMMUNITY): Payer: Medicare Other | Admitting: Vascular Surgery

## 2012-04-27 ENCOUNTER — Encounter (HOSPITAL_COMMUNITY): Payer: Self-pay | Admitting: *Deleted

## 2012-04-27 DIAGNOSIS — Z01812 Encounter for preprocedural laboratory examination: Secondary | ICD-10-CM | POA: Insufficient documentation

## 2012-04-27 DIAGNOSIS — N186 End stage renal disease: Secondary | ICD-10-CM | POA: Insufficient documentation

## 2012-04-27 DIAGNOSIS — K439 Ventral hernia without obstruction or gangrene: Secondary | ICD-10-CM

## 2012-04-27 DIAGNOSIS — Z940 Kidney transplant status: Secondary | ICD-10-CM | POA: Insufficient documentation

## 2012-04-27 DIAGNOSIS — Z01810 Encounter for preprocedural cardiovascular examination: Secondary | ICD-10-CM | POA: Insufficient documentation

## 2012-04-27 DIAGNOSIS — Z01818 Encounter for other preprocedural examination: Secondary | ICD-10-CM | POA: Insufficient documentation

## 2012-04-27 DIAGNOSIS — K432 Incisional hernia without obstruction or gangrene: Principal | ICD-10-CM | POA: Insufficient documentation

## 2012-04-27 DIAGNOSIS — I120 Hypertensive chronic kidney disease with stage 5 chronic kidney disease or end stage renal disease: Secondary | ICD-10-CM | POA: Insufficient documentation

## 2012-04-27 HISTORY — DX: Gout due to renal impairment, unspecified site: M10.30

## 2012-04-27 HISTORY — PX: INCISIONAL HERNIA REPAIR: SHX193

## 2012-04-27 HISTORY — PX: INSERTION OF MESH: SHX5868

## 2012-04-27 SURGERY — REPAIR, HERNIA, INCISIONAL, LAPAROSCOPIC
Anesthesia: General | Wound class: Clean

## 2012-04-27 MED ORDER — LACTATED RINGERS IV SOLN
INTRAVENOUS | Status: DC | PRN
  Administered 2012-04-27 (×2): via INTRAVENOUS

## 2012-04-27 MED ORDER — OXYCODONE HCL 5 MG PO TABS: 5.0000 mg | ORAL_TABLET | ORAL | Status: DC | PRN

## 2012-04-27 MED ORDER — LACTATED RINGERS IV SOLN
INTRAVENOUS | Status: DC
  Administered 2012-04-27: 12:00:00 via INTRAVENOUS

## 2012-04-27 MED ORDER — ONDANSETRON HCL 4 MG/2ML IJ SOLN
INTRAMUSCULAR | Status: DC | PRN
  Administered 2012-04-27: 4 mg via INTRAVENOUS

## 2012-04-27 MED ORDER — LIDOCAINE HCL 4 % MT SOLN
OROMUCOSAL | Status: DC | PRN
  Administered 2012-04-27: 4 mL via TOPICAL

## 2012-04-27 MED ORDER — BUPIVACAINE-EPINEPHRINE PF 0.25-1:200000 % IJ SOLN
INTRAMUSCULAR | Status: AC
  Filled 2012-04-27: qty 30

## 2012-04-27 MED ORDER — CARVEDILOL 12.5 MG PO TABS: 12.5000 mg | ORAL_TABLET | Freq: Two times a day (BID) | ORAL | Status: DC

## 2012-04-27 MED ORDER — NEOSTIGMINE METHYLSULFATE 1 MG/ML IJ SOLN
INTRAMUSCULAR | Status: DC | PRN
  Administered 2012-04-27: 3 mg via INTRAVENOUS

## 2012-04-27 MED ORDER — ONDANSETRON HCL 4 MG/2ML IJ SOLN
4.0000 mg | Freq: Four times a day (QID) | INTRAMUSCULAR | Status: DC | PRN
  Administered 2012-04-27: 4 mg via INTRAVENOUS
  Filled 2012-04-27: qty 2

## 2012-04-27 MED ORDER — HYDROMORPHONE HCL PF 1 MG/ML IJ SOLN
0.2500 mg | INTRAMUSCULAR | Status: DC | PRN
  Administered 2012-04-27 (×4): 0.5 mg via INTRAVENOUS

## 2012-04-27 MED ORDER — ACETAMINOPHEN 325 MG PO TABS
650.0000 mg | ORAL_TABLET | Freq: Four times a day (QID) | ORAL | Status: DC | PRN
  Administered 2012-04-29: 650 mg via ORAL
  Filled 2012-04-27: qty 2

## 2012-04-27 MED ORDER — GLYCOPYRROLATE 0.2 MG/ML IJ SOLN
0.4000 mg | Freq: Once | INTRAMUSCULAR | Status: AC
  Administered 2012-04-27: 0.4 mg via INTRAVENOUS

## 2012-04-27 MED ORDER — GLYCOPYRROLATE 0.2 MG/ML IJ SOLN
INTRAMUSCULAR | Status: AC
  Filled 2012-04-27: qty 2

## 2012-04-27 MED ORDER — SODIUM CHLORIDE 0.9 % IV SOLN
INTRAVENOUS | Status: DC
  Administered 2012-04-27 – 2012-04-29 (×4): via INTRAVENOUS

## 2012-04-27 MED ORDER — DIPHENHYDRAMINE HCL 50 MG/ML IJ SOLN: 12.5000 mg | Freq: Four times a day (QID) | INTRAMUSCULAR | Status: DC | PRN

## 2012-04-27 MED ORDER — NALOXONE HCL 0.4 MG/ML IJ SOLN: 0.4000 mg | INTRAMUSCULAR | Status: DC | PRN

## 2012-04-27 MED ORDER — SODIUM CHLORIDE 0.9 % IJ SOLN: 9.0000 mL | INTRAMUSCULAR | Status: DC | PRN

## 2012-04-27 MED ORDER — MORPHINE SULFATE (PF) 1 MG/ML IV SOLN
INTRAVENOUS | Status: DC
  Administered 2012-04-27: 1 mg via INTRAVENOUS
  Administered 2012-04-27: 3 mg via INTRAVENOUS
  Administered 2012-04-27: 6 mg via INTRAVENOUS
  Administered 2012-04-28: 1 mg via INTRAVENOUS
  Administered 2012-04-28 (×2): 2 mg via INTRAVENOUS
  Administered 2012-04-28: 5 mg via INTRAVENOUS
  Administered 2012-04-28: 7 mg via INTRAVENOUS
  Administered 2012-04-28: 3 mg via INTRAVENOUS
  Administered 2012-04-28 – 2012-04-29 (×2): 4 mg via INTRAVENOUS
  Administered 2012-04-29: 5 mg via INTRAVENOUS
  Filled 2012-04-27: qty 25

## 2012-04-27 MED ORDER — VITAMIN D3 25 MCG (1000 UNIT) PO TABS
1000.0000 [IU] | ORAL_TABLET | Freq: Every day | ORAL | Status: DC
  Filled 2012-04-27: qty 1

## 2012-04-27 MED ORDER — BUPIVACAINE-EPINEPHRINE 0.25% -1:200000 IJ SOLN
INTRAMUSCULAR | Status: DC | PRN
  Administered 2012-04-27: 11 mL

## 2012-04-27 MED ORDER — SODIUM CHLORIDE 0.9 % IR SOLN
Status: DC | PRN
  Administered 2012-04-27: 1000 mL

## 2012-04-27 MED ORDER — ACETAMINOPHEN 650 MG RE SUPP: 650.0000 mg | Freq: Four times a day (QID) | RECTAL | Status: DC | PRN

## 2012-04-27 MED ORDER — HYDROMORPHONE HCL PF 1 MG/ML IJ SOLN
INTRAMUSCULAR | Status: AC
  Filled 2012-04-27: qty 1

## 2012-04-27 MED ORDER — CARVEDILOL 12.5 MG PO TABS
12.5000 mg | ORAL_TABLET | Freq: Two times a day (BID) | ORAL | Status: DC
  Administered 2012-04-27 – 2012-04-30 (×7): 12.5 mg via ORAL
  Filled 2012-04-27 (×11): qty 1

## 2012-04-27 MED ORDER — GLYCOPYRROLATE 0.2 MG/ML IJ SOLN
INTRAMUSCULAR | Status: DC | PRN
  Administered 2012-04-27: 0.4 mg via INTRAVENOUS

## 2012-04-27 MED ORDER — LIDOCAINE HCL (CARDIAC) 20 MG/ML IV SOLN
INTRAVENOUS | Status: DC | PRN
  Administered 2012-04-27: 90 mg via INTRAVENOUS

## 2012-04-27 MED ORDER — PROPOFOL 10 MG/ML IV BOLUS
INTRAVENOUS | Status: DC | PRN
  Administered 2012-04-27: 200 mg via INTRAVENOUS

## 2012-04-27 MED ORDER — ONDANSETRON HCL 4 MG/2ML IJ SOLN: 4.0000 mg | Freq: Once | INTRAMUSCULAR | Status: DC | PRN

## 2012-04-27 MED ORDER — FENTANYL CITRATE 0.05 MG/ML IJ SOLN
INTRAMUSCULAR | Status: DC | PRN
  Administered 2012-04-27 (×2): 50 ug via INTRAVENOUS
  Administered 2012-04-27: 150 ug via INTRAVENOUS

## 2012-04-27 MED ORDER — MYCOPHENOLATE MOFETIL 250 MG PO CAPS
250.0000 mg | ORAL_CAPSULE | Freq: Four times a day (QID) | ORAL | Status: DC
  Filled 2012-04-27 (×2): qty 1

## 2012-04-27 MED ORDER — ROCURONIUM BROMIDE 100 MG/10ML IV SOLN
INTRAVENOUS | Status: DC | PRN
  Administered 2012-04-27: 10 mg via INTRAVENOUS
  Administered 2012-04-27: 50 mg via INTRAVENOUS
  Administered 2012-04-27: 5 mg via INTRAVENOUS
  Administered 2012-04-27: 10 mg via INTRAVENOUS

## 2012-04-27 MED ORDER — CEFAZOLIN SODIUM 1-5 GM-% IV SOLN
1.0000 g | Freq: Three times a day (TID) | INTRAVENOUS | Status: DC
  Administered 2012-04-27 – 2012-04-28 (×2): 1 g via INTRAVENOUS
  Filled 2012-04-27 (×4): qty 50

## 2012-04-27 MED ORDER — MORPHINE SULFATE (PF) 1 MG/ML IV SOLN
INTRAVENOUS | Status: AC
  Filled 2012-04-27: qty 25

## 2012-04-27 MED ORDER — ONDANSETRON HCL 4 MG/2ML IJ SOLN
4.0000 mg | Freq: Four times a day (QID) | INTRAMUSCULAR | Status: DC | PRN
Start: 2012-04-27 — End: 2012-04-27

## 2012-04-27 MED ORDER — HEPARIN SODIUM (PORCINE) 5000 UNIT/ML IJ SOLN
5000.0000 [IU] | Freq: Three times a day (TID) | INTRAMUSCULAR | Status: DC
  Administered 2012-04-28 – 2012-04-30 (×7): 5000 [IU] via SUBCUTANEOUS
  Filled 2012-04-27 (×10): qty 1

## 2012-04-27 MED ORDER — DIPHENHYDRAMINE HCL 12.5 MG/5ML PO ELIX
12.5000 mg | ORAL_SOLUTION | Freq: Four times a day (QID) | ORAL | Status: DC | PRN
  Filled 2012-04-27: qty 5

## 2012-04-27 MED ORDER — VITAMIN D3 25 MCG (1000 UNIT) PO TABS
1000.0000 [IU] | ORAL_TABLET | Freq: Every day | ORAL | Status: DC
  Administered 2012-04-28 – 2012-04-30 (×3): 1000 [IU] via ORAL
  Filled 2012-04-27 (×3): qty 1

## 2012-04-27 MED ORDER — TACROLIMUS 1 MG PO CAPS
1.0000 mg | ORAL_CAPSULE | Freq: Two times a day (BID) | ORAL | Status: DC
  Filled 2012-04-27: qty 1

## 2012-04-27 SURGICAL SUPPLY — 44 items
APPLIER CLIP LOGIC TI 5 (MISCELLANEOUS) IMPLANT
APPLIER CLIP ROT 10 11.4 M/L (STAPLE)
BANDAGE GAUZE ELAST BULKY 4 IN (GAUZE/BANDAGES/DRESSINGS) ×2 IMPLANT
BENZOIN TINCTURE PRP APPL 2/3 (GAUZE/BANDAGES/DRESSINGS) IMPLANT
CANISTER SUCTION 2500CC (MISCELLANEOUS) IMPLANT
CHLORAPREP W/TINT 26ML (MISCELLANEOUS) ×2 IMPLANT
CLIP APPLIE ROT 10 11.4 M/L (STAPLE) IMPLANT
CLOTH BEACON ORANGE TIMEOUT ST (SAFETY) ×2 IMPLANT
COVER SURGICAL LIGHT HANDLE (MISCELLANEOUS) ×2 IMPLANT
DECANTER SPIKE VIAL GLASS SM (MISCELLANEOUS) ×2 IMPLANT
DERMABOND ADVANCED (GAUZE/BANDAGES/DRESSINGS) ×1
DERMABOND ADVANCED .7 DNX12 (GAUZE/BANDAGES/DRESSINGS) ×1 IMPLANT
DEVICE SECURE STRAP 25 ABSORB (INSTRUMENTS) ×6 IMPLANT
DEVICE TROCAR PUNCTURE CLOSURE (ENDOMECHANICALS) ×2 IMPLANT
DRAPE INCISE IOBAN 66X45 STRL (DRAPES) ×2 IMPLANT
ELECT REM PT RETURN 9FT ADLT (ELECTROSURGICAL) ×2
ELECTRODE REM PT RTRN 9FT ADLT (ELECTROSURGICAL) ×1 IMPLANT
GLOVE BIO SURGEON STRL SZ7 (GLOVE) ×2 IMPLANT
GLOVE BIO SURGEON STRL SZ7.5 (GLOVE) ×4 IMPLANT
GLOVE BIOGEL PI IND STRL 7.5 (GLOVE) ×2 IMPLANT
GLOVE BIOGEL PI INDICATOR 7.5 (GLOVE) ×2
GOWN STRL NON-REIN LRG LVL3 (GOWN DISPOSABLE) ×6 IMPLANT
KIT BASIN OR (CUSTOM PROCEDURE TRAY) ×2 IMPLANT
KIT ROOM TURNOVER OR (KITS) ×2 IMPLANT
MARKER SKIN DUAL TIP RULER LAB (MISCELLANEOUS) ×2 IMPLANT
MESH PHYSIO OVAL 20X25CM (Mesh General) ×2 IMPLANT
NEEDLE SPNL 22GX3.5 QUINCKE BK (NEEDLE) ×2 IMPLANT
NS IRRIG 1000ML POUR BTL (IV SOLUTION) ×2 IMPLANT
PAD ARMBOARD 7.5X6 YLW CONV (MISCELLANEOUS) ×4 IMPLANT
PEN SKIN MARKING BROAD (MISCELLANEOUS) ×2 IMPLANT
SCALPEL HARMONIC ACE (MISCELLANEOUS) ×2 IMPLANT
SCISSORS LAP 5X35 DISP (ENDOMECHANICALS) ×2 IMPLANT
SET IRRIG TUBING LAPAROSCOPIC (IRRIGATION / IRRIGATOR) IMPLANT
SLEEVE ENDOPATH XCEL 5M (ENDOMECHANICALS) ×6 IMPLANT
SUT MNCRL AB 4-0 PS2 18 (SUTURE) ×4 IMPLANT
SUT PROLENE 0 CT 1 30 (SUTURE) ×2 IMPLANT
SUT PROLENE 0 CT 1 CR/8 (SUTURE) ×4 IMPLANT
TOWEL OR 17X24 6PK STRL BLUE (TOWEL DISPOSABLE) ×2 IMPLANT
TOWEL OR 17X26 10 PK STRL BLUE (TOWEL DISPOSABLE) ×2 IMPLANT
TRAY FOLEY CATH 14FRSI W/METER (CATHETERS) ×2 IMPLANT
TRAY LAPAROSCOPIC (CUSTOM PROCEDURE TRAY) ×2 IMPLANT
TROCAR XCEL BLUNT TIP 100MML (ENDOMECHANICALS) IMPLANT
TROCAR XCEL NON-BLD 11X100MML (ENDOMECHANICALS) ×2 IMPLANT
TROCAR XCEL NON-BLD 5MMX100MML (ENDOMECHANICALS) ×2 IMPLANT

## 2012-04-27 NOTE — Preoperative (Signed)
Beta Blockers   Reason not to administer Beta Blockers:Not Applicable 

## 2012-04-27 NOTE — Transfer of Care (Signed)
Immediate Anesthesia Transfer of Care Note  Patient: Mario Woods.  Procedure(s) Performed: Procedure(s) (LRB) with comments: LAPAROSCOPIC INCISIONAL HERNIA (N/A) INSERTION OF MESH (N/A)  Patient Location: PACU  Anesthesia Type:General  Level of Consciousness: awake, alert , oriented and patient cooperative  Airway & Oxygen Therapy: Patient Spontanous Breathing and Patient connected to nasal cannula oxygen  Post-op Assessment: Report given to PACU RN, Post -op Vital signs reviewed and stable and Patient moving all extremities  Post vital signs: Reviewed and stable  Complications: No apparent anesthesia complications

## 2012-04-27 NOTE — Anesthesia Postprocedure Evaluation (Signed)
  Anesthesia Post-op Note  Patient: Mario Woods.  Procedure(s) Performed: Procedure(s) (LRB) with comments: LAPAROSCOPIC INCISIONAL HERNIA (N/A) INSERTION OF MESH (N/A)  Patient Location: PACU  Anesthesia Type:General  Level of Consciousness: awake, alert , oriented and patient cooperative  Airway and Oxygen Therapy: Patient Spontanous Breathing  Post-op Pain: mild  Post-op Assessment: Post-op Vital signs reviewed, Patient's Cardiovascular Status Stable, Respiratory Function Stable, Patent Airway, No signs of Nausea or vomiting and Pain level controlled  Post-op Vital Signs: stable  Complications: No apparent anesthesia complications

## 2012-04-27 NOTE — H&P (View-Only) (Signed)
Patient ID: Mario Dirusso., male   DOB: 03/16/1961, 51 y.o.   MRN: [id number]  Chief Complaint  Patient presents with  . Follow-up    HPI Mario Woods. is a 51 y.o. male.  HPI This is a 51 year old male who I know well from 2010. At that point in time he had end-stage renal disease and was being considered for a transplant. I then took him to the operating room as he had an appendiceal mass and did a laparoscopic appendectomy which showed a benign appendix with focal dilatation of the lumen with associated mucin. There was no definite neoplasm identified. At that time I also repaired 2 ventral hernias in his abdominal wall with suture as I did not want to place mesh at that same time. Since then he has received a renal transplant in November of 2010 at [facility] for which he is doing very well. He had a return of the bulge near his prior ventral hernia repair that has gotten bigger over this time. He is having normal bowel movements and has no nausea or vomiting associated with this. It does bother him and give him some discomfort if he overeats or when he exerts himself. This is had no change since I last seen him. He is rescheduled multiple times and now he appears to be ready to go.  Past Medical History  Diagnosis Date  . Hypertension   . Kidney disease     Past Surgical History  Procedure Date  . Appendectomy 01/2009  . Kidney transplant 04/2009  . Hernia repair 01/2009    primary ventral hernia repair x2     Family History  Problem Relation Age of Onset  . Pancreatic cancer      Social History History  Substance Use Topics  . Smoking status: Never Smoker   . Smokeless tobacco: Not on file  . Alcohol Use: Yes     occasionally    No Known Allergies  Current Outpatient Prescriptions  Medication Sig Dispense Refill  . aspirin 81 MG chewable tablet Chew 81 mg by mouth every morning.      . carvedilol (COREG) 12.5 MG tablet Take 12.5 mg by mouth 2 (two) times daily  with a meal.      . cholecalciferol (VITAMIN D) 1000 UNITS tablet Take 1,000 Units by mouth daily.      . mycophenolate (CELLCEPT) 250 MG capsule Take 1,000 mg by mouth 2 (two) times daily.       . tacrolimus (PROGRAF) 1 MG capsule Take 2 mg by mouth 2 (two) times daily.         Review of Systems Review of Systems  Blood pressure 119/68, pulse 74, temperature 97.8 F (36.6 C), temperature source Temporal, resp. rate 14, height 6\' 5"  (1.956 m), weight 295 lb (133.811 kg).  Physical Exam Physical Exam  Reducible nontender ventral hernia above umbilicus   Assessment    Recurrent ventral hernia    Plan    We again discussed laparoscopic ventral hernia. We discussed risks, benefits and postoperative course and restrictions.         Mario Woods 04/06/2012, 10:39 AM

## 2012-04-27 NOTE — Consult Note (Signed)
Reason for Consult: Continuity of ESRD care s/p LURD Kidney transplant  Referring Physician: Dr. [name]- [facility]  Mario Woods. is an 51 y.o. male.  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 Underwent an uncomplicated LURD kidney transplant with alemtuzumab induction back in 04/2009 and had had a stable allograft function with creatinine ranging 1.5-1.7 since then. Unfortunately has had some compliance lapses with variable FK levels. Admitted for surgical management of recurrent periumbilical hernia after initial repair/appendeceal mass removal pre-LURD transplant. Was able to tolerate pain/post prandial symptoms until recently, now electing for surgical repair (declined to have this earlier this year). He had an uneventful surgery done yesterday and states that postoperative pain has been very well controlled. Had some transient nausea last evening but better today. Tolerating clear liquids. On oxygen via nasal cannula.  Past Medical History  Diagnosis Date  . Hypertension   . Kidney disease   . H/O kidney transplant     Past Surgical History  Procedure Date  . Appendectomy 01/2009  . Kidney transplant 04/2009  . Hernia repair 01/2009    primary ventral hernia repair x2   . Av fistula placement     Left    Family History  Problem Relation Age of Onset  . Pancreatic cancer      Social History:  reports that he has never smoked. He does not have any smokeless tobacco history on file. He reports that he drinks alcohol. He reports that he does not use illicit drugs.  Allergies:  Allergies  Allergen Reactions  . Tape Rash    Paper tape ok    Medications:  Scheduled:    . carvedilol  12.5 mg Oral BID WC  . cholecalciferol  1,000 Units Oral Daily  . [EXPIRED] glycopyrrolate      . [COMPLETED] glycopyrrolate  0.4 mg Intravenous Once  . heparin  5,000 Units Subcutaneous Q8H  . [EXPIRED] HYDROmorphone      . [EXPIRED]  HYDROmorphone      . morphine   Intravenous Q4H  . [EXPIRED] morphine      . mycophenolate  1,000 mg Oral BID  . [COMPLETED] sodium chloride  500 mL Intravenous Once  . tacrolimus  2 mg Oral BID  . [DISCONTINUED] carvedilol  12.5 mg Oral BID WC  . [DISCONTINUED]  ceFAZolin (ANCEF) IV  3 g Intravenous 60 min Pre-Op  . [DISCONTINUED]  ceFAZolin (ANCEF) IV  1 g Intravenous Q8H  . [DISCONTINUED] cholecalciferol  1,000 Units Oral Daily  . [DISCONTINUED] mycophenolate  250 mg Oral QID  . [DISCONTINUED] tacrolimus  1 mg Oral BID    Results for orders placed during the hospital encounter of 04/27/12 (from the past 48 hour(s))  BASIC METABOLIC PANEL     Status: Abnormal   Collection Time   04/28/12  6:55 AM      Component Value Range Comment   Sodium 137  135 - 145 mEq/L    Potassium 3.9  3.5 - 5.1 mEq/L    Chloride 105  96 - 112 mEq/L    CO2 23  19 - 32 mEq/L    Glucose, Bld 118 (*) 70 - 99 mg/dL    BUN 17  6 - 23 mg/dL    Creatinine, Ser 1.53 (*) 0.50 - 1.35 mg/dL    Calcium 8.5  8.4 - 10.5 mg/dL    GFR calc non Af Amer 52 (*) >90 mL/min    GFR  calc Af Amer 60 (*) >90 mL/min   CBC     Status: Abnormal   Collection Time   04/28/12  6:55 AM      Component Value Range Comment   WBC 9.6  4.0 - 10.5 K/uL    RBC 3.98 (*) 4.22 - 5.81 MIL/uL    Hemoglobin 12.5 (*) 13.0 - 17.0 g/dL    HCT 38.2 (*) 39.0 - 52.0 %    MCV 96.0  78.0 - 100.0 fL    MCH 31.4  26.0 - 34.0 pg    MCHC 32.7  30.0 - 36.0 g/dL    RDW 12.7  11.5 - 15.5 %    Platelets 207  150 - 400 K/uL     No results found.  Review of Systems  Constitutional: Positive for malaise/fatigue. Negative for fever, chills, weight loss and diaphoresis.  HENT: Negative.   Eyes: Negative.   Respiratory: Negative.   Cardiovascular: Negative.   Gastrointestinal: Positive for nausea and abdominal pain. Negative for heartburn, vomiting, diarrhea, constipation, blood in stool and melena.       Symptoms controlled by post-operative  medications  Genitourinary: Negative.   Musculoskeletal: Negative.   Skin: Negative.   Neurological: Negative.  Negative for weakness.  Endo/Heme/Allergies: Negative.   Psychiatric/Behavioral: Negative.    Blood pressure 148/96, pulse 60, temperature 98.8 F (37.1 C), temperature source Oral, resp. rate 23, SpO2 100.00%. Physical Exam  Nursing note and vitals reviewed. Constitutional: He is oriented to person, place, and time. He appears well-developed and well-nourished. No distress.       On oxygen via nasal cannula  HENT:  Head: Normocephalic and atraumatic.  Nose: Nose normal.  Mouth/Throat: Oropharynx is clear and moist. No oropharyngeal exudate.  Eyes: Conjunctivae normal are normal. Pupils are equal, round, and reactive to light. Scleral icterus is present.  Neck: Normal range of motion. Neck supple. No JVD present. No tracheal deviation present. No thyromegaly present.  Cardiovascular: Normal rate, regular rhythm and intact distal pulses.  Exam reveals no gallop and no friction rub.   No murmur heard. Respiratory: Effort normal and breath sounds normal. No respiratory distress. He has no wheezes. He has no rales. He exhibits no tenderness.       Sub-optimal inspiratory effort  GI: Soft. Bowel sounds are normal. He exhibits no distension. There is tenderness. There is no rebound.  Musculoskeletal: Normal range of motion. He exhibits no edema and no tenderness.  Neurological: He is alert and oriented to person, place, and time.  Skin: Skin is warm and dry. No rash noted. He is not diaphoretic. No erythema. No pallor.  Psychiatric: He has a normal mood and affect. His behavior is normal.    Assessment/Plan: 1. ESRD s/p LURD kidney transplant- now nearing his 2nd year anniversary with stable allograft function and an uncomplicated course so far. Restarted Cellcept and Prograf at previously prescribed doses post-op. We'll continue to follow the patient during his postsurgical state  and convert him over to intravenous/sublingual immunosuppressive therapy if develops ileus. 2. S/P hernia repair: Successful hernia repair yesterday, doing well postoperatively. 3. Metabolic bone disease: On cholecalciferol supplementation, calcium levels acceptable. Previous parathyroid hormone level check within normal 4. Hypertension: Appears to have been satisfactorily controlled on monotherapy with carvedilol.  Damiyah Ditmars K. 04/28/2012, 10:15 AM

## 2012-04-27 NOTE — Interval H&P Note (Signed)
History and Physical Interval Note:  04/27/2012 12:20 PM  Mario Woods.  has presented today for surgery, with the diagnosis of incisional hernia  The various methods of treatment have been discussed with the patient and family. After consideration of risks, benefits and other options for treatment, the patient has consented to  Procedure(s) (LRB) with comments: LAPAROSCOPIC INCISIONAL HERNIA (N/A) INSERTION OF MESH (N/A) as a surgical intervention .  The patient's history has been reviewed, patient examined, no change in status, stable for surgery.  I have reviewed the patient's chart and labs.  Questions were answered to the patient's satisfaction.     Jaggar Benko

## 2012-04-27 NOTE — Anesthesia Procedure Notes (Addendum)
Procedure Name: Intubation Date/Time: 04/27/2012 1:29 PM Performed by: Sabra Heck L Pre-anesthesia Checklist: Patient identified, Timeout performed, Emergency Drugs available, Suction available and Patient being monitored Patient Re-evaluated:Patient Re-evaluated prior to inductionOxygen Delivery Method: Circle system utilized Preoxygenation: Pre-oxygenation with 100% oxygen Intubation Type: IV induction Ventilation: Mask ventilation without difficulty Laryngoscope Size: Mac and 4 Grade View: Grade I Tube type: Oral Tube size: 8.0 mm Number of attempts: 1 Airway Equipment and Method: Stylet and LTA kit utilized Placement Confirmation: ETT inserted through vocal cords under direct vision,  breath sounds checked- equal and bilateral and positive ETCO2 Secured at: 23 cm Tube secured with: Tape Dental Injury: Teeth and Oropharynx as per pre-operative assessment

## 2012-04-27 NOTE — Anesthesia Preprocedure Evaluation (Addendum)
Anesthesia Evaluation  Patient identified by MRN, date of birth, ID band Patient awake    Reviewed: Allergy & Precautions, H&P , NPO status , Patient's Chart, lab work & pertinent test results, reviewed documented beta blocker date and time   Airway Mallampati: I TM Distance: >3 FB Neck ROM: Full    Dental  (+) Teeth Intact and Dental Advisory Given   Pulmonary          Cardiovascular hypertension, Pt. on medications Rhythm:regular Rate:Normal     Neuro/Psych    GI/Hepatic   Endo/Other    Renal/GU Renal diseaseKidney transplant     Musculoskeletal   Abdominal   Peds  Hematology   Anesthesia Other Findings H/O Kidney Transplant  Reproductive/Obstetrics                          Anesthesia Physical Anesthesia Plan  ASA: III  Anesthesia Plan: General   Post-op Pain Management:    Induction: Intravenous  Airway Management Planned: Oral ETT  Additional Equipment:   Intra-op Plan:   Post-operative Plan:   Informed Consent: I have reviewed the patients History and Physical, chart, labs and discussed the procedure including the risks, benefits and alternatives for the proposed anesthesia with the patient or authorized representative who has indicated his/her understanding and acceptance.   Dental advisory given  Plan Discussed with: Anesthesiologist, Surgeon and CRNA  Anesthesia Plan Comments:        Anesthesia Quick Evaluation

## 2012-04-27 NOTE — Op Note (Signed)
Preoperative diagnosis: recurrent ventral hernia Postoperative diagnosis: Same as above Procedure: Laparoscopic repair of recurrent ventral hernia with 20 x 25 cm Physiomesh Surgeon: Dr. Serita Grammes Anesthesia: Gen. Endotracheal Specimens: None Drains: None Estimated blood loss: Minimal Complications: None Sponge and needle count correct at end of operation Disposition to recovery in stable condition  Indications: This is a 51 year old male who I initially knewwhile he was on dialysis and was on the transplant list. 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 I took them at that time and did a laparoscopic appendectomy for what ended up being a benign lesion and I primarily repaired his hernia. He since has gotten a renal transplant about 3 years ago and is doing very well from that. His hernia has recurred at that time as I told him it certainly might. He and I have now discussed a laparoscopic ventral hernia repair with mesh. I have cleared this with his transplant surgeons prior to beginning as well.  Procedure: After informed consent was obtained the patient was taken to the operating room. He was given 2 g of intravenous cefazolin. Sequential compression devices on his legs. He was then placed under general endotracheal anesthesia without complication. Orogastric tube and a Foley catheter were placed. He was then prepped and draped in the standard sterile surgical fashion. Charlie Pitter was placed over his abdomen. Surgical timeout was performed.  I infiltrated Marcaine in his left upper quadrant and made an incision. I entered the abdomen with a 5 mm Optiview trocar without any difficulty. There was no evidence of an entry injury upon completion. I then inserted a 10 mm trocar in the left midabdomen and a 5 mm trocar in the left lower quadrant. Eventually I also inserted 2 5 mm trocars in the right side of the abdomen as well. He had omentum stuck  in a hernia sac that was a total of about 6 x 6 cm. I spent about 15 minutes lysing these lesions to free all this up. I then used a Harmonic scalpel to take down the falciform ligament took place by mesh. I then chose a 20 x 25 cm size of mesh. I did cut the curved ends off which was about a centimeter total in each direction I then placed this in the abdomen through the 10 mm port. I then used the suture passer to bring all of these ends up in their appropriate positions. This was at least 5 cm of overlap in each direction. I then used the secure strap tacker to secure the edges. I then bisected my other sutures with additional 0 Prolene sutures giving me about 5 cm between sutures. At the completion the mesh was flat. It was hemostatic. This was in good position it gave me good overlap. There was no evidence of any injury intra-abdominally. I then removed the 10 mm trocar closes with 0 Vicryl. I then removed my remaining trocars and desufflated the abdomen. Dermabond and Monocryl were used to close the incisions. An abdominal binder was placed. His Foley catheter and orogastric tube were removed upon completion. He was extubated and transferred to recovery in stable condition. Nephrology will follow him postoperatively due to his renal transplant.

## 2012-04-28 ENCOUNTER — Encounter (HOSPITAL_COMMUNITY): Payer: Self-pay | Admitting: General Surgery

## 2012-04-28 LAB — BASIC METABOLIC PANEL
BUN: 17 mg/dL (ref 6–23)
Calcium: 8.5 mg/dL (ref 8.4–10.5)
Creatinine, Ser: 1.53 mg/dL — ABNORMAL HIGH (ref 0.50–1.35)
GFR calc Af Amer: 60 mL/min — ABNORMAL LOW (ref >90)
GFR calc non Af Amer: 52 mL/min — ABNORMAL LOW (ref >90)

## 2012-04-28 LAB — CBC
MCHC: 32.7 g/dL (ref 30.0–36.0)
RDW: 12.7 % (ref 11.5–15.5)

## 2012-04-28 MED ORDER — SODIUM CHLORIDE 0.9 % IV BOLUS (SEPSIS)
500.0000 mL | Freq: Once | INTRAVENOUS | Status: AC
  Administered 2012-04-28: 500 mL via INTRAVENOUS

## 2012-04-28 MED ORDER — WHITE PETROLATUM GEL
Status: AC
  Administered 2012-04-28: 12:00:00
  Filled 2012-04-28: qty 5

## 2012-04-28 MED ORDER — TACROLIMUS 1 MG PO CAPS
2.0000 mg | ORAL_CAPSULE | Freq: Two times a day (BID) | ORAL | Status: DC
  Administered 2012-04-28 – 2012-04-30 (×6): 2 mg via ORAL
  Filled 2012-04-28 (×8): qty 2

## 2012-04-28 MED ORDER — MYCOPHENOLATE MOFETIL 250 MG PO CAPS
1000.0000 mg | ORAL_CAPSULE | Freq: Two times a day (BID) | ORAL | Status: DC
  Administered 2012-04-28 – 2012-04-30 (×6): 1000 mg via ORAL
  Filled 2012-04-28 (×7): qty 4

## 2012-04-28 NOTE — Progress Notes (Signed)
Rn has made several attempts for patient to ambulate in the hall. Tech has made appt with patient to walk at 1600.

## 2012-04-28 NOTE — Progress Notes (Signed)
1 Day Post-Op  Subjective: Sore, some nausea overnight but better now  Objective: Vital signs in last 24 hours: Temp:  [97.7 F (36.5 C)-99 F (37.2 C)] 98.8 F (37.1 C) (11/19 0606) Pulse Rate:  [45-72] 60  (11/19 0606) Resp:  [11-22] 16  (11/19 0606) BP: (131-148)/(71-96) 148/96 mmHg (11/19 0606) SpO2:  [91 %-100 %] 99 % (11/19 0606) Last BM Date: 04/27/12  Intake/Output from previous day: 11/18 0701 - 11/19 0700 In: 3141.7 [P.O.:480; I.V.:2661.7] Out: 725 [Urine:675; Blood:50] Intake/Output this shift:    General appearance: no distress Resp: clear to auscultation bilaterally Cardio: regular rate and rhythm GI: approp tender, soft, wounds clean, some bs present  Lab Results:   [facility] 04/28/12 0655  WBC 9.6  HGB 12.5*  HCT 38.2*  PLT 207     Assessment/Plan: POD 1 lap ventral hernia 1. Cont pca today 2. pulm toilet, needs oob 3. Continue coreg 4. bmet pending, I asked nephrology to follow also with renal tx, on his cellcept and prograf 5. Scds. Sq heparin 6. Will advance to fulls later today if doing well.  [facility] 04/28/2012

## 2012-04-28 NOTE — Progress Notes (Signed)
Orthopedic Tech Progress Note Patient Details:  Adan Dewaard Oct 31, 1960 [id number] Abdominal binder (large) delivered to attending nurse per order in nursing orders Ortho Devices Type of Ortho Device: Abdominal binder Ortho Device/Splint Interventions: Ordered   [place] R Thompson 04/28/2012, 12:11 PM

## 2012-04-29 LAB — BASIC METABOLIC PANEL
Calcium: 8.7 mg/dL (ref 8.4–10.5)
GFR calc Af Amer: 63 mL/min — ABNORMAL LOW (ref >90)
GFR calc non Af Amer: 54 mL/min — ABNORMAL LOW (ref >90)
Potassium: 4.1 mEq/L (ref 3.5–5.1)
Sodium: 135 mEq/L (ref 135–145)

## 2012-04-29 MED ORDER — MORPHINE SULFATE 2 MG/ML IJ SOLN: 2.0000 mg | INTRAMUSCULAR | Status: DC | PRN

## 2012-04-29 MED ORDER — OXYCODONE HCL 5 MG PO TABS: 5.0000 mg | ORAL_TABLET | ORAL | Status: DC | PRN

## 2012-04-29 NOTE — Progress Notes (Signed)
Patient ID: Mario Woods., male   DOB: 07/19/1960, 51 y.o.   MRN: [id number]   [facility] Progress Note    Subjective:   Reports to be feeling fair, tolerable abdominal pain. No flatus but without any nausea/vomiting. Attempting to ambulate around hallways.    Objective:   BP 137/76  Pulse 72  Temp 98.2 F (36.8 C) (Oral)  Resp 21  Ht 6\' 5"  (1.956 m)  Wt 132 kg (291 lb 0.1 oz)  BMI 34.51 kg/m2  SpO2 96%  Intake/Output Summary (Last 24 hours) at 04/29/12 1105 Last data filed at 04/29/12 0814  Gross per 24 hour  Intake   2540 ml  Output   1550 ml  Net    990 ml   Weight change:   Physical Exam: Gen: Comfortable CVS: Pulse regular in rate and rhythm, heart sounds S1 and S2 normal Resp: Poor inspiratory effort however clear to auscultation bilaterally Abd: Soft, minimal tenderness over incisions, scant bowel sounds Ext: No lower extremity edema  Imaging: No results found.  Labs: BMET  Lab 04/29/12 0535 04/28/12 0655  NA 135 137  K 4.1 3.9  CL 104 105  CO2 22 23  GLUCOSE 102* 118*  BUN 13 17  CREATININE 1.48* 1.53*  ALB -- --  CALCIUM 8.7 8.5  PHOS -- --   CBC  Lab 04/28/12 0655  WBC 9.6  NEUTROABS --  HGB 12.5*  HCT 38.2*  MCV 96.0  PLT 207    Medications:      . carvedilol  12.5 mg Oral BID WC  . cholecalciferol  1,000 Units Oral Daily  . heparin  5,000 Units Subcutaneous Q8H  . mycophenolate  1,000 mg Oral BID  . tacrolimus  2 mg Oral BID  . [COMPLETED] white petrolatum      . [DISCONTINUED] morphine   Intravenous Q4H     Assessment/ Plan:   1. ESRD s/p LURD kidney transplant- now nearing his 2nd year anniversary with stable allograft function and an uncomplicated course so far. Renal allograft function remains stable, immunosuppressants restarted yesterday postoperatively. No significant ileus to prompt need for conversion to intravenous/sublingual therapy. No acute electrolyte or volume concerns noted to prompt  intervention. 2. S/P hernia repair: Successful hernia repair yesterday, doing well postoperatively per Dr. Cristal Generous notes.  3. Metabolic bone disease: On cholecalciferol supplementation, calcium levels acceptable. Previous parathyroid hormone level check within normal  4. Hypertension: Appears to have been satisfactorily controlled on monotherapy with carvedilol.   Elmarie Shiley, MD 04/29/2012, 11:05 AM

## 2012-04-29 NOTE — Progress Notes (Signed)
2 Days Post-Op  Subjective: No flatus but no n/v, tolerating clears fine, voiding, ambulated some  Objective: Vital signs in last 24 hours: Temp:  [98 F (36.7 C)-99 F (37.2 C)] 98.2 F (36.8 C) (11/20 0545) Pulse Rate:  [65-72] 72  (11/20 0545) Resp:  [18-24] 21  (11/20 0545) BP: (123-141)/(76-83) 137/76 mmHg (11/20 0545) SpO2:  [96 %-100 %] 96 % (11/20 0545) FiO2 (%):  [98 %] 98 % (11/20 0814) Weight:  [291 lb 0.1 oz (132 kg)] 291 lb 0.1 oz (132 kg) (11/19 1541) Last BM Date: 04/27/12  Intake/Output from previous day: 11/19 0701 - 11/20 0700 In: 3160 [P.O.:360; I.V.:2300; IV Piggyback:500] Out: 1300 [Urine:1300] Intake/Output this shift:    General appearance: no distress Resp: clear to auscultation bilaterally Cardio: regular rate and rhythm GI: approp tender, wounds clean, good bs  Lab Results:   [facility] 04/28/12 0655  WBC 9.6  HGB 12.5*  HCT 38.2*  PLT 207   BMET  Basename 04/29/12 0535 04/28/12 0655  NA 135 137  K 4.1 3.9  CL 104 105  CO2 22 23  GLUCOSE 102* 118*  BUN 13 17  CREATININE 1.48* 1.53*  CALCIUM 8.7 8.5    Assessment/Plan: POD 2 lap ventral hernia 1. Will dc pca, oral pain meds with iv backup 2. pulm toilet, needs oob more 3. Cr stable, appreciate nephrology following 4. Full liquids I think ileus is starting to resolve 5. Sq heparin, scds  Lowanda Cashaw 04/29/2012

## 2012-04-30 ENCOUNTER — Telehealth (INDEPENDENT_AMBULATORY_CARE_PROVIDER_SITE_OTHER): Payer: Self-pay | Admitting: General Surgery

## 2012-04-30 LAB — BASIC METABOLIC PANEL
Chloride: 106 mEq/L (ref 96–112)
Creatinine, Ser: 1.47 mg/dL — ABNORMAL HIGH (ref 0.50–1.35)
GFR calc Af Amer: 63 mL/min — ABNORMAL LOW (ref >90)
GFR calc non Af Amer: 55 mL/min — ABNORMAL LOW (ref >90)
Potassium: 3.7 mEq/L (ref 3.5–5.1)

## 2012-04-30 MED ORDER — OXYCODONE HCL 5 MG PO TABS: 5.0000 mg | ORAL_TABLET | ORAL | Status: DC | PRN

## 2012-04-30 NOTE — Progress Notes (Signed)
3 Days Post-Op  Subjective: Passing flatus, tol diet, pain controlled with oral meds  Objective: Vital signs in last 24 hours: Temp:  [98.2 F (36.8 C)-98.9 F (37.2 C)] 98.9 F (37.2 C) (11/21 0527) Pulse Rate:  [61-73] 61  (11/21 0527) Resp:  [16-19] 16  (11/21 0527) BP: (124-148)/(69-87) 129/84 mmHg (11/21 0527) SpO2:  [96 %-97 %] 96 % (11/21 0527) FiO2 (%):  [98 %] 98 % (11/20 0814) Last BM Date: 04/27/12  Intake/Output from previous day: 11/20 0701 - 11/21 0700 In: 2575.4 [P.O.:720; I.V.:1855.4] Out: 1550 [Urine:1550] Intake/Output this shift:    General appearance: no distress Resp: clear to auscultation bilaterally Cardio: regular rate and rhythm GI: soft, approp tender wounds clean bs present  Lab Results:   [facility] 04/28/12 0655  WBC 9.6  HGB 12.5*  HCT 38.2*  PLT 207   BMET  Basename 04/30/12 0515 04/29/12 0535  NA 139 135  K 3.7 4.1  CL 106 104  CO2 23 22  GLUCOSE 96 102*  BUN 12 13  CREATININE 1.47* 1.48*  CALCIUM 8.8 8.7    Assessment/Plan: POD 3 lap ventral hernia 1. Po pain meds 2. Regular diet 3. Cr stable 4. dc home today  [facility] 04/30/2012

## 2012-04-30 NOTE — Telephone Encounter (Signed)
LMOM letting pt know he has an appt to see [place] on 12/6 at 11:40.

## 2012-04-30 NOTE — Progress Notes (Signed)
Patient discharged to home with wife.  Discharge teaching completed including follow up care, medications, signs and symptoms of infection, diet and activity.  Patient verbalizes understanding with no further questions.  Vital signs stable, tolerating regular diet without complaints of nausea.  No complaints of pain. Discharged per wheelchair with family.

## 2012-05-04 NOTE — Discharge Summary (Signed)
Physician Discharge Summary  Patient ID: Mario Woods. MRN: [id number] DOB/AGE: 03-06-1961 51 y.o.  Admit date: 04/27/2012 Discharge date: 05/04/2012  Admission Diagnoses: S/p renal transplant Ventral hernia  Discharge Diagnoses:  Same as above S/p lap ventral hernia  Discharged Condition: good  Hospital Course: 51 yom who I know from prior lap appy for mucocele with primary repair of ventral hernia prior to renal transplant.  He has now undergone renal transplant with good allograft function.  He has recurrence of primary hernia repair.  I took him to OR and did laparoscopic ventral hernia repair with mesh.  He did well  On postop day two, he was ambulating, tolerating diet, having flatus with pain controlled.  Consults: none  Significant Diagnostic Studies: none  Treatments: laparoscopic ventral hernia repair with mesh    Disposition: 01-Home or Self Care  Discharge Orders    Future Appointments: Provider: Department: Dept Phone: Center:   05/15/2012 11:40 AM Mario Bookbinder, MD [facility] [phone] None       Medication List     As of 05/04/2012  9:05 AM    TAKE these medications         aspirin 81 MG chewable tablet   Chew 81 mg by mouth every morning.      carvedilol 12.5 MG tablet   Commonly known as: COREG   Take 12.5 mg by mouth 2 (two) times daily with a meal.      cholecalciferol 1000 UNITS tablet   Commonly known as: VITAMIN D   Take 1,000 Units by mouth daily.      mycophenolate 250 MG capsule   Commonly known as: CELLCEPT   Take 1,000 mg by mouth 2 (two) times daily.      oxyCODONE 5 MG immediate release tablet   Commonly known as: Oxy IR/ROXICODONE   Take 1-2 tablets (5-10 mg total) by mouth every 4 (four) hours as needed.      tacrolimus 1 MG capsule   Commonly known as: PROGRAF   Take 2 mg by mouth 2 (two) times daily.           Follow-up Information    Follow up with [facility], MD. In 2 weeks.   Contact information:   [address] [place] [address] [phone]          Signed: Rolm Woods 05/04/2012, 9:05 AM

## 2012-05-15 ENCOUNTER — Encounter (INDEPENDENT_AMBULATORY_CARE_PROVIDER_SITE_OTHER): Payer: Self-pay | Admitting: General Surgery

## 2012-05-15 ENCOUNTER — Ambulatory Visit (INDEPENDENT_AMBULATORY_CARE_PROVIDER_SITE_OTHER): Payer: Medicare Other | Admitting: General Surgery

## 2012-05-15 VITALS — BP 132/70 | HR 80 | Temp 97.8°F | Resp 18 | Ht 77.0 in | Wt 278.4 lb

## 2012-05-15 DIAGNOSIS — Z09 Encounter for follow-up examination after completed treatment for conditions other than malignant neoplasm: Secondary | ICD-10-CM

## 2012-05-15 NOTE — Progress Notes (Signed)
Subjective:     Patient ID: Mario Chars., male   DOB: 1961-03-06, 51 y.o.   MRN: [id number]  HPI This is a 51 year old male who I know well from prior surgery. He has a history of a renal transplant. He had a recurrent ventral hernia. I recently did a laparoscopic incisional hernia With mesh. He was in the hospital for 3 days afterwards and eventually his ileus resolved. He returns today doing well. His pain is under good control. [place] having bowel movements normally. He has no nausea or vomiting. He is oriented back to work and is doing very well.  Review of Systems     Objective:   Physical Exam Incisions all healing well without infection, minimal seroma    Assessment:     S/p lap ventral hernia repair    Plan:     He is doing well postop.  We discussed energy will get better with time and fatigue at work will get better.  Will continue with work restrictions and I will see back after new year

## 2012-06-19 ENCOUNTER — Encounter (INDEPENDENT_AMBULATORY_CARE_PROVIDER_SITE_OTHER): Payer: Medicare Other | Admitting: General Surgery

## 2012-06-22 ENCOUNTER — Encounter (INDEPENDENT_AMBULATORY_CARE_PROVIDER_SITE_OTHER): Payer: Medicare Other | Admitting: General Surgery

## 2012-06-22 ENCOUNTER — Ambulatory Visit (INDEPENDENT_AMBULATORY_CARE_PROVIDER_SITE_OTHER): Payer: Medicare Other | Admitting: General Surgery

## 2012-06-22 ENCOUNTER — Encounter (INDEPENDENT_AMBULATORY_CARE_PROVIDER_SITE_OTHER): Payer: Self-pay | Admitting: General Surgery

## 2012-06-22 VITALS — BP 128/82 | HR 76 | Temp 97.9°F | Resp 18 | Ht 77.5 in | Wt 284.2 lb

## 2012-06-22 DIAGNOSIS — Z09 Encounter for follow-up examination after completed treatment for conditions other than malignant neoplasm: Secondary | ICD-10-CM

## 2012-06-22 NOTE — Progress Notes (Signed)
Subjective:     Patient ID: Mario Woods., male   DOB: Apr 10, 1961, 52 y.o.   MRN: [id number]  HPI This is a 52 year old male with a history of a renal transplant who I did a laparoscopic incisional hernia with mesh on. He returns today doing well without any complaints.  Review of Systems     Objective:   Physical Exam Well-healed incisions without any infection    Assessment:     S/p lap incisional hernia repair with mesh    Plan:     He is doing well I released in the full activity. He will come back and see me as needed.

## 2013-02-15 ENCOUNTER — Ambulatory Visit: Payer: 59

## 2013-02-15 ENCOUNTER — Encounter: Payer: Self-pay | Admitting: Radiology

## 2013-02-15 ENCOUNTER — Ambulatory Visit (INDEPENDENT_AMBULATORY_CARE_PROVIDER_SITE_OTHER): Payer: 59 | Admitting: Family Medicine

## 2013-02-15 VITALS — BP 138/78 | HR 58 | Temp 98.0°F | Resp 17 | Ht 77.0 in | Wt 273.0 lb

## 2013-02-15 DIAGNOSIS — R059 Cough, unspecified: Secondary | ICD-10-CM

## 2013-02-15 DIAGNOSIS — R1110 Vomiting, unspecified: Secondary | ICD-10-CM

## 2013-02-15 DIAGNOSIS — R05 Cough: Secondary | ICD-10-CM

## 2013-02-15 DIAGNOSIS — D649 Anemia, unspecified: Secondary | ICD-10-CM

## 2013-02-15 LAB — POCT CBC
Granulocyte percent: 76.9 %G (ref 37–80)
HCT, POC: 33.3 % — AB (ref 43.5–53.7)
Hemoglobin: 10.4 g/dL — AB (ref 14.1–18.1)
Lymph, poc: 1.2 (ref 0.6–3.4)
MCH, POC: 30.1 pg (ref 27–31.2)
MCHC: 31.2 g/dL — AB (ref 31.8–35.4)
MCV: 96.5 fL (ref 80–97)
MID (cbc): 0.7 (ref 0–0.9)
MPV: 7.4 fL (ref 0–99.8)
POC Granulocyte: 6.2 (ref 2–6.9)
POC LYMPH PERCENT: 14.6 %L (ref 10–50)
POC MID %: 8.5 %M (ref 0–12)
Platelet Count, POC: 216 10*3/uL (ref 142–424)
RBC: 3.45 M/uL — AB (ref 4.69–6.13)
RDW, POC: 13.3 %
WBC: 8 10*3/uL (ref 4.6–10.2)

## 2013-02-15 LAB — COMPREHENSIVE METABOLIC PANEL
ALT: 9 U/L (ref 0–53)
AST: 9 U/L (ref 0–37)
Albumin: 3.8 g/dL (ref 3.5–5.2)
Alkaline Phosphatase: 68 U/L (ref 39–117)
BUN: 115 mg/dL — ABNORMAL HIGH (ref 6–23)
CO2: 12 mEq/L — ABNORMAL LOW (ref 19–32)
Calcium: 8.8 mg/dL (ref 8.4–10.5)
Chloride: 114 mEq/L — ABNORMAL HIGH (ref 96–112)
Creat: 11.86 mg/dL (ref 0.50–1.35)
Glucose, Bld: 100 mg/dL — ABNORMAL HIGH (ref 70–99)
Potassium: 5.6 mEq/L — ABNORMAL HIGH (ref 3.5–5.3)
Sodium: 140 mEq/L (ref 135–145)
Total Bilirubin: 0.3 mg/dL (ref 0.3–1.2)
Total Protein: 6.5 g/dL (ref 6.0–8.3)

## 2013-02-15 MED ORDER — PREDNISONE 20 MG PO TABS: ORAL_TABLET | ORAL | Status: DC

## 2013-02-15 MED ORDER — LEVOFLOXACIN 500 MG PO TABS: 500.0000 mg | ORAL_TABLET | Freq: Every day | ORAL | Status: DC

## 2013-02-15 MED ORDER — HYDROCOD POLST-CHLORPHEN POLST 10-8 MG/5ML PO LQCR: 5.0000 mL | Freq: Two times a day (BID) | ORAL | Status: DC | PRN

## 2013-02-15 NOTE — Progress Notes (Signed)
  Subjective:    Patient ID: Mario Chars., male    DOB: 1960/07/13, 52 y.o.   MRN: [id number]  HPI 52 y.o. Pilot presents to clinic with persistent cough. Has been fighting it for extended period of time; 2 months. Has previously been treated with Augmentin, z-pac, cephalosporin and still feels no better. Was seen at another urgent care, no blood work or x-rays done at that time.  Has had vomiting 3-4 times in the past week from mucus build up.   Had kidney transplant 3-4 years ago due to kidney disease. Currently taking prograf and cellcept.    Pain in left side with deep breaths, stuffiness in sinuses. Getting short of breath with activity.  This week patient is alert omeprazole control his reflux.   Review of Systems     Objective:   Physical Exam Healthy-appearing middle-aged man in no acute distress HEENT: Moderate swelling in the nasal passages, normal TMs, normal oropharynx Neck: Supple no adenopathy or thyromegaly Chest: Bibasilar inspiratory rales Heart: Regular no murmur or gallop Abdomen: Soft nontender no HSM Extremities: No edema, normal pulses Skin: No rash Results for orders placed in visit on 02/15/13  POCT CBC      Result Value Range   WBC 8.0  4.6 - 10.2 K/uL   Lymph, poc 1.2  0.6 - 3.4   POC LYMPH PERCENT 14.6  10 - 50 %L   MID (cbc) 0.7  0 - 0.9   POC MID % 8.5  0 - 12 %M   POC Granulocyte 6.2  2 - 6.9   Granulocyte percent 76.9  37 - 80 %G   RBC 3.45 (*) 4.69 - 6.13 M/uL   Hemoglobin 10.4 (*) 14.1 - 18.1 g/dL   HCT, POC 33.3 (*) 43.5 - 53.7 %   MCV 96.5  80 - 97 fL   MCH, POC 30.1  27 - 31.2 pg   MCHC 31.2 (*) 31.8 - 35.4 g/dL   RDW, POC 13.3     Platelet Count, POC 216  142 - 424 K/uL   MPV 7.4  0 - 99.8 fL   [facility] reading (PRIMARY) by  Dr. Joseph Art:  No infiltrates.      Assessment & Plan:  Cough - Plan: POCT CBC, DG Chest 2 View, DG SinUS 1-2 Views, levofloxacin (LEVAQUIN) 500 MG tablet, chlorpheniramine-HYDROcodone (TUSSIONEX  PENNKINETIC ER) 10-8 MG/5ML LQCR, predniSONE (DELTASONE) 20 MG tablet  Vomiting - Plan: POCT CBC, DG Chest 2 View, DG SinUS 1-2 Views Check CMET, and concerned about his low hemoglobin as well. This is been followed up by the nephrologist when he has his regular scheduled visit in a couple weeks. Past patient call me in 48 hours laminotomies doing regarding the cough Signed, Robyn Haber, MD

## 2013-02-15 NOTE — Patient Instructions (Addendum)

## 2013-02-16 ENCOUNTER — Encounter: Payer: Self-pay | Admitting: Physician Assistant

## 2013-02-16 DIAGNOSIS — Z940 Kidney transplant status: Secondary | ICD-10-CM | POA: Insufficient documentation

## 2013-02-16 DIAGNOSIS — Z87441 Personal history of nephrotic syndrome: Secondary | ICD-10-CM | POA: Insufficient documentation

## 2013-06-16 ENCOUNTER — Encounter (INDEPENDENT_AMBULATORY_CARE_PROVIDER_SITE_OTHER): Payer: Medicare Other | Admitting: Surgery

## 2013-06-29 ENCOUNTER — Encounter (INDEPENDENT_AMBULATORY_CARE_PROVIDER_SITE_OTHER): Payer: Self-pay | Admitting: Surgery

## 2013-06-29 ENCOUNTER — Ambulatory Visit (INDEPENDENT_AMBULATORY_CARE_PROVIDER_SITE_OTHER): Payer: Medicare Other | Admitting: Surgery

## 2013-06-29 ENCOUNTER — Encounter (INDEPENDENT_AMBULATORY_CARE_PROVIDER_SITE_OTHER): Payer: Self-pay

## 2013-06-29 VITALS — BP 120/78 | HR 78 | Resp 20 | Ht 77.0 in | Wt 269.0 lb

## 2013-06-29 DIAGNOSIS — N186 End stage renal disease: Secondary | ICD-10-CM

## 2013-06-29 DIAGNOSIS — E669 Obesity, unspecified: Secondary | ICD-10-CM

## 2013-06-29 DIAGNOSIS — Z992 Dependence on renal dialysis: Secondary | ICD-10-CM

## 2013-06-29 NOTE — Progress Notes (Signed)
Subjective:     Patient ID: Mario Chars., male   DOB: 1960/12/09, 53 y.o.   MRN: [id number]  HPI  Note: This dictation was prepared with Dragon/digital dictation along with [employer]. Any transcriptional errors that result from this process are unintentional.       Mario Woods  March 12, 1961 [id number]  Patient Care Team: Darlyne Russian, MD as PCP - General (Family Medicine) Lucrezia Starch, MD as Consulting Physician (Nephrology) [name], MD as Consulting Physician (General Surgery)  This patient is a 53 y.o.male who presents today for surgical evaluation at the request of Dr. Lorrene Reid.   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  Need for dialysis.  Consider peritoneal dialysis.  Pleasant active male.  Developed renal failure.  Was able to get a kidney transplant at [facility].  Worked well for the first few years.  Unfortunately, he has rejected it.  Weaning off immunosuppressants.  Started back up on hemodialysis q. Monday/Wednesday/Friday.  Uses a left forearm AV fistula.  Very little urine output.  Does not like hemodialysis.  Limits his activity.  Limits his ability to work.  Wished to consider peritoneal dialysis for more independence.  Can walk 2 or 3 miles without difficulty.  Usually has 2-3 bowel movements a day.  Required appendectomy for possible tumor.  Turned out to be normal.  Had umbilical hernias repaired primarily.  It recurred.  Repaired laparoscopically a year ago.  Denies any new abdominal swelling/hernias.  Has not smoked in over 4 years.  Not had a colonoscopy.  Just turned 52.  It is on his "to do list"  Patient Active Problem List   Diagnosis Date Noted  . H/O primary IgA nephropathy 02/16/2013  . H/O kidney transplant 02/16/2013    Past Medical History  Diagnosis Date  . Hypertension   . H/O kidney transplant   . Kidney disease   . Gout due to renal impairment     history  (04/28/2012)  . Blood transfusion without reported diagnosis     Past Surgical History  Procedure Laterality Date  . Kidney transplant  04/2009  . Av fistula placement      Left  . Incisional hernia repair  04/27/2012    Procedure: LAPAROSCOPIC INCISIONAL HERNIA;  Surgeon: [name], MD;  Location: [facility];  Service: General;  Laterality: N/A;  . Insertion of mesh  04/27/2012    Procedure: INSERTION OF MESH;  Surgeon: [name], MD;  Location: [facility];  Service: General;  Laterality: N/A;  . Hernia repair  01/2009    primary ventral hernia repair x2   . Appendectomy  01/2009    History   Social History  . Marital Status: Married    Spouse Name: N/A    Number of Children: N/A  . Years of Education: N/A   Occupational History  . Not on file.   Social History Main Topics  . Smoking status: Former Smoker -- 0.50 packs/day for 8 years    Types: Cigarettes    Quit date: 04/10/2009  . Smokeless tobacco: Never Used     Comment: 04/28/2012 "quit smoking ~ 3 yr ago"  . Alcohol Use: Yes     Comment: 04/28/2012 "6pk q 6 months"  . Drug Use: No  . Sexual Activity: Yes   Other Topics Concern  . Not on file   Social History Narrative  . No narrative on file    Family History  Problem Relation Age of Onset  . Pancreatic cancer      Current Outpatient Prescriptions  Medication Sig Dispense Refill  . aspirin 81 MG chewable tablet Chew 81 mg by mouth every morning.      . mycophenolate (CELLCEPT) 250 MG capsule Take 1,000 mg by mouth 2 (two) times daily.      . predniSONE (DELTASONE) 20 MG tablet 2 daily with food  10 tablet  1  . tacrolimus (PROGRAF) 1 MG capsule Take 2 mg by mouth 2 (two) times daily.       No current facility-administered medications for this visit.     Allergies  Allergen Reactions  . Tape Rash    Paper tape ok    BP 120/78  Pulse 78  Resp 20  Ht 6\' 5"  (1.956 m)  Wt 269 lb (122.018 kg)  BMI 31.89 kg/m2  No results  found.   Review of Systems  Constitutional: Negative for fever, chills and diaphoresis.  HENT: Negative for ear discharge, facial swelling, mouth sores, nosebleeds, sore throat and trouble swallowing.   Eyes: Negative for photophobia, discharge and visual disturbance.  Respiratory: Negative for choking, chest tightness, shortness of breath and stridor.   Cardiovascular: Negative for chest pain and palpitations.  Gastrointestinal: Negative for nausea, vomiting, abdominal pain, diarrhea, constipation, blood in stool, abdominal distention, anal bleeding and rectal pain.  Endocrine: Negative for cold intolerance and heat intolerance.  Genitourinary: Negative for dysuria, urgency, difficulty urinating and testicular pain.  Musculoskeletal: Negative for arthralgias, back pain, gait problem and myalgias.  Skin: Negative for color change, pallor, rash and wound.  Allergic/Immunologic: Negative for environmental allergies and food allergies.  Neurological: Negative for dizziness, speech difficulty, weakness, numbness and headaches.  Hematological: Negative for adenopathy. Does not bruise/bleed easily.  Psychiatric/Behavioral: Negative for hallucinations, confusion and agitation.       Objective:   Physical Exam  Constitutional: He is oriented to person, place, and time. He appears well-developed and well-nourished. No distress.  HENT:  Head: Normocephalic.  Mouth/Throat: Oropharynx is clear and moist. No oropharyngeal exudate.  Eyes: Conjunctivae and EOM are normal. Pupils are equal, round, and reactive to light. No scleral icterus.  Neck: Normal range of motion. Neck supple. No tracheal deviation present.  Cardiovascular: Normal rate, regular rhythm and intact distal pulses.   Pulmonary/Chest: Effort normal and breath sounds normal. No respiratory distress.  Abdominal: Soft. He exhibits no distension. There is no tenderness. There is no rigidity, no guarding and no CVA tenderness. No hernia.  Hernia confirmed negative in the ventral area, confirmed negative in the right inguinal area and confirmed negative in the left inguinal area.    Musculoskeletal: Normal range of motion. He exhibits no tenderness.       Arms: Lymphadenopathy:    He has no cervical adenopathy.       Right: No inguinal adenopathy present.       Left: No inguinal adenopathy present.  Neurological: He is alert and oriented to person, place, and time. No cranial nerve deficit. He exhibits normal muscle tone. Coordination normal.  Skin: Skin is warm and dry. No rash noted. He is not diaphoretic. No erythema. No pallor.  Psychiatric: He has a normal mood and affect. His behavior is normal. Judgment and thought content normal.       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  Desire for peritoneal dialysis.  Prior umbilical ventral hernias with recurrence.  No evidence  of recurrent status post repair with mesh laparoscopically.     Plan:     I think the patient is a reasonable candidate for peritoneal dialysis.  The biggest question will be if he has too many adhesions in the abdomen to eliminate this.  Reasonable to start with laparoscopic exploration.  I feel no obvious hernias, but I would check for recurrence.  I will have to guide placement of the catheter outside of where the central abdominal mesh is.  He is interested in proceeding.  I discussed with him:  The anatomy & physiology of peritoneum was discussed.  Natural history risks without surgery of worsening renal failure was discussed.   I feel the risks of no intervention will lead to serious problems that outweigh the operative risks; therefore, I recommended placement of a peritoneal dialysis catheter.  I explained laparoscopic techniques with possible need for an open approach.    Risks such as bleeding, infection, abscess, injury to other organs, catheter occlusion or malpositioning,  reoperation to remove/reposition the catheter, heart attack, death, and other risks were discussed.   I noted a good likelihood this will help address the problem.  Possibility that this will not be enough to compensate for the renal failure & need for further treatment such as hemodialysis was explained.  Goals of post-operative recovery were discussed as well.  We will work to minimize complications.   The patient is/will be getting training on catheter use by dialysis nursing before and after surgery.  I stressed the importance of meticulous care & sterile technique to prevent catheter problems.  Questions were answered.  The patient expresses understanding & wishes to proceed with surgery.

## 2013-06-29 NOTE — Patient Instructions (Signed)
See the Handout(s) we gave you.  Consider surgery For a laparoscopic exploration, placement of peritoneal dialysis catheter, possible repair of hernias..    Please call our office at [phone] if you wish to schedule surgery or if you have further questions / concerns.   PERITONEAL DIALYSIS (CAPD) CATHETER PLACEMENT:  POST OPERATIVE INSTRUCTIONS  1. DIET: Follow a light bland diet the first 24 hours after arrival home, such as soup, liquids, crackers, etc.  Be sure to include lots of fluids daily.  Avoid fast food or heavy meals as your are more likely to get nauseated.   2. Take your usually prescribed home medications unless otherwise directed. 3. PAIN CONTROL: a. Pain is best controlled by a usual combination of three different methods TOGETHER: i. Ice/Heat ii. Tylenol (over the counter pain medication) iii. Prescription pain medication b. Most patients will experience some swelling and bruising around the incisions.  Ice packs or heating pads (30-60 minutes up to 6 times a day) will help. Use ice for the first few days to help decrease swelling and bruising, then switch to heat to help relax tight/sore spots and speed recovery.  Some people prefer to use ice alone, heat alone, alternating between ice & heat.  Experiment to what works for you.  Swelling and bruising can take several weeks to resolve.   c. It is helpful to take an over-the-counter pain medication regularly for the first few weeks.  Using acetaminophen (Tylenol, etc) 500-650mg  four times a day (every meal & bedtime) is usually safest since NSAIDs are not advisable in patients with kidney disease. d. A  prescription for pain medication (such as oxycodone, hydrocodone, etc) should be given to you upon discharge.  Take your pain medication as prescribed.  i. If you are having problems/concerns with the prescription medicine (does not control pain, nausea, vomiting, rash, itching, etc), please call us [phone] to see if  we need to switch you to a different pain medicine that will work better for you and/or control your side effect better. ii. If you need a refill on your pain medication, please contact your pharmacy.  They will contact our office to request authorization. Prescriptions will not be filled after 5 pm or on week-ends. 4. Avoid getting constipated.  Between the surgery and the pain medications, it is common to experience some constipation.  Increasing fluid intake and taking a fiber supplement (such as Metamucil, Citrucel, FiberCon, MiraLax, etc) 1-2 times a day regularly will usually help prevent this problem from occurring.  A mild laxative (prune juice, Milk of Magnesia, MiraLax, etc) should be taken according to package directions if there are no bowel movements after 48 hours.   5. Wash / shower every day.  You may shower over the dressings as they are waterproof.  Continue to shower over incision(s) after the dressing is off. 6. The Peritoneal Dialysis nurse will remove your waterproof bandages in the Dialysis Center a few days after surgery.  Do not remove the bandages until seen by them. 7. ACTIVITIES as tolerated:   a. You may resume regular (light) daily activities beginning the next day-such as daily self-care, walking, climbing stairs-gradually increasing activities as tolerated.  If you can walk 30 minutes without difficulty, it is safe to try more intense activity such as jogging, treadmill, bicycling, low-impact aerobics, swimming, etc. b. Save the most intensive and strenuous activity for last such as sit-ups, heavy lifting, contact sports, etc  Refrain from any heavy lifting or straining until  you are off narcotics for pain control.   c. DO NOT PUSH THROUGH PAIN.  Let pain be your guide: If it hurts to do something, don't do it.  Pain is your body warning you to avoid that activity for another week until the pain goes down. d. You may drive when you are no longer taking prescription pain  medication, you can comfortably wear a seatbelt, and you can safely maneuver your car and apply brakes. e. Dennis Bast may have sexual intercourse when it is comfortable.  FOLLOW UP with the Peritoneal Dialysis nurses closely after surgery.  Call [phone] to help arrange training/flushes of cathete    -The CAPD nurses & Nephrology usually follow you closely, making the need for follow-up in our office redundant and therefore not needed.  If they or you have concerns, please call us for possible follow-up in our office  -Please call [facility] at [phone] only as needed.  WHEN TO CALL us [phone]: 1. Poor pain control 2. Reactions / problems with new medications (rash/itching, nausea, etc)  3. Fever over 101.5 F (38.5 C) 4. Worsening swelling or bruising 5. Continued bleeding from incision. 6. Increased pain, redness, or drainage from the incision   The clinic staff is available to answer your questions during regular business hours (8:30am-5pm).  Please don't hesitate to call and ask to speak to one of our nurses for clinical concerns.   If you have a medical emergency, go to the nearest emergency room or call 911.  A surgeon from [facility] is always on call at the hospitals  9. IF YOU HAVE DISABILITY OR FAMILY LEAVE FORMS, BRING THEM TO THE OFFICE FOR PROCESSING.  DO NOT GIVE THEM TO YOUR DOCTOR.  [facility], [place], [place], [place], [place]  [address] ? MAIN: [phone] ? TOLL FREE: [phone] ?  FAX [phone] [email or web address]  Peritoneal Dialysis - An Overview Dialysis can be done using a machine outside of the body (hemodialysis). Or, it can be done inside the body (peritoneal dialysis). The word "peritoneal" refers to the lining or membrane of the belly (abdominal cavity). The peritoneal membrane is a thin, plastic-like lining inside the belly that covers the organs and fits in the abdominal or peritoneal  cavity, such as the stomach, liver and the kidneys. This lining works like a filter. It will allow certain things to pass from your blood through the lining and into a special solution that has been placed into your belly. In this type of dialysis, the peritoneum is used to help clean the blood.  If you need dialysis, your kidneys are not working right. Healthy kidneys take out extra water and waste products, which becomes urine. When the kidneys do not do this, serious problems can develop. The waste and water build up in the blood. Your hands and feet might swell. You may feel tired, weak or sick to your stomach. Also, your blood pressure may rise. If not treated, you could die. Dialysis is a treatment that does the work that your kidneys would do if they were healthy.  It cleans your blood.   It will make sure your body has the right amount of certain chemicals that it needs. They include potassium, sodium and bicarbonate.   It will help control your blood pressure.  UNDERSTANDING PERITONEAL DIALYSIS  Here is how peritoneal dialysis works:   First, you will have surgery to put a soft plastic tube (catheter) into your  belly (abdomen). This will allow you to easily connect yourself to special tubing, which will then let a special dialysis solution to be placed into your abdomen.   For each treatment, you will need at least one bag of dialysis solution (a liquid called dialysate). It is a mix of water that is pure and free of germs (sterile), sugar (dextrose) and the nutrients and minerals found in your blood. Sometimes, more than one bag is needed to get the right amount of fluid for your abdomen. Your caregiver will explain what size and how many bags you will need.   The dialysate is slowly put through the catheter to fill the abdomen (called the peritoneal cavity). This dialysate will need to stay in your body for 3-4 hours. This is known as the dwell time.   The solution is working to clean  the blood and remove wastes from your body. At the end of this time, the solution is drained from your body through tubing into an empty bag. It is then replaced with a fresh dialysate.   The draining and replacing of the dialysate is called an exchange or cycle. The catheter is capped after each exchange. Once the solution is in your body, you are then free to do whatever you would like until the next exchange. Most people will need to do 4-5 exchanges each day.   There are two different methods that can be used.   Continuous ambulatory peritoneal dialysis (CAPD): You put the solution into your abdomen, cap your catheter and then go about your day. Several hours later, you reconnect to a tubing set up, drain out the solution and then put more solution in. This is done several times a day. No machine is needed.   Continuous cycler-assisted peritoneal dialysis (CCPD): A machine is used, which fills the abdomen with dialysate and then drains it. This happens several times. It usually is done at night while you are sleeping. When you wake up, you can disconnect from the machine and are free to go to go about your day.  PREPARING FOR EXCHANGES  Discuss the details of the procedure with your caregivers. You will be working with a nurse who is specially trained in doing dialysis. Make sure you understand:   How to do an exchange.   How much solution you need.   What type of solution you will need.   How often you should do an exchange. Ask:   How many times each day?   When? At meals? At bedtime?   Always keep the dialysate bags and other supplies in a cool, clean and dry place.   Keeping everything clean is very important.   The catheter and its cap must be free from germs (sterile)   The adapter also must be sterile. It attaches the dialysis bag and tubing to the catheter.   Clean the area of your body around the catheter every day. Use a chemical that fights infection (antiseptic).    Wash your hands thoroughly before starting an exchange.   You may be taught to wear a mask to cover your nose and mouth. This makes infection less likely to happen.   You may be taught to close doors, windows and turn off any fans before doing an exchange.   Check the dialysate bag very carefully.   Make sure it is the right size bag for you. This information is on the label.   Also, make sure it is the right mixture. For  some people, the dialysate contents vary. For instance, the mixture might be a stronger solution for overnight.   Check the expiration date (the last date you can use the bag). It also is on the label. If the date has gone by, throw away the bag.   The solution should be clear. You should be able to see any writing on the side of the bag clearly through the solution. Do not use a cloudy solution.   Gently squeeze the bag to make sure there are no leaks.   Use a dry heating pad to warm the dialysate in the bag. Leave the cover on the bag while you do this.   This is for comfort. You can skip this step if you want.   Never place the bag of solution under warm or hot water. Water from a faucet is not sterile and could cause germs to get into the bag. Infection could then result.  PERFORMING AN EXCHANGE  For continuous ambulatory dialysis:   Attach the dialysis bag and tubing to your catheter. Hang the bag so that gravity (the natural downward pull) draws the solution down and into your abdomen once the clamps are opened. This should take about 10 minutes.   Remove the bag and tubing from the catheter. Cap the catheter.   The solution stays in the abdomen for 3-4 hours (dwell time). The solution is working to clean the blood and remove wastes from your body.   When you are ready to drain the solution for another exchange, take the cap off the catheter. Then, attach the catheter to tubing, which is attached to an empty bag. Place this empty bag below the abdomen or  on the floor or stool and undo the clamps.   Gravity helps pull the fluid out of the abdomen and into the bag. The fluid in the bag may look yellow and clear, like urine. It usually takes about 20 minutes to drain the fluid out of the abdomen.   When the solution has drained, start the process again by infusing a new bag of dialysate and then capping the catheter.   This should continue until you have used all of the solution that you are to use each day.   Sometimes, a small machine is used overnight. It is called a mini-cycler. This is done if the body cannot go all night without an exchange. The machine lets you sleep without having to get up and do an exchange.   For continuous cycler-assisted dialysis:   You will be taught how to set up or program your machine.   When you are ready for bed, put the dialysate bags onto the cycler machine. Put on exactly the number of bags that your caregiver said to use.   Connect your catheter to the machine and turn the cycler machine on.   Overnight, the cycler will do several exchanges. It often does three to five, sometimes more.   Solution that is in your abdomen in the morning will stay during the day. The machine is set to make the daytime solution stronger, if that is needed.   In the morning, you will disconnect from the machine and cap your catheter and go about your day.   Sometimes, an extra exchange is done during the day. This may be needed to remove excess waste or fluid.  IMPORTANT REMINDERS  You will need to follow a very strict schedule. Every step of the dialysis procedure must be done every  day. Sometimes, several times a day. Altogether, this might take an extra 2 hours or more. However, you must stick to the routine. Do not skip a day. Do not skip a procedure.   Some people find it helpful to work with a Social worker or Education officer, museum in addition to the renal (kidney) nurse. They can help you figure out how to change your daily  routine to fit in the dialysis sessions.   You may need to change your diet. Ask your caregiver for advice, or talk with a nutritionist about what you should and should not eat.   You will need to weigh yourself every day and keep track of what your weight is.   You may be taught how to check your blood pressure before every exchange. Your blood pressure reading will help determine what type of solution to use. If your blood pressure is too high, you may need a stronger solution.  RISKS AND COMPLICATIONS  Possible problems vary, depending on the method you use. Your overall health also can have an effect. Problems that could develop because of dialysis include:  Infection. This is the most common problem. It could occur:   In the peritoneum. This is called peritonitis.   Around the catheter.   Weight gain. The dialysate contains a type of sugar known as dextrose. Dextrose has a lot of calories. The body takes in several hundred calories from this sugar each day.   Weakened muscles in the abdomen. This can result from all of the fluid that your body has to hold in the abdomen.   Catheter replacement. Sometimes, a new one has to be put in.   Change in dialysis method. Due to some complications, you may need to change to hemodialysis for a short time and have your dialysis done at a center.   Trouble adjusting to your new lifestyle. In some people, this leads to depression.   Sleep problems.   Dialysis-related amyloidosis. This sometimes occurs after 5 years of dialysis. Protein builds up in the blood. This can cause painful deposits on bones, joints and tendons (which connect muscle to bone). Or, it can cause hollow spots in bones that make them more likely to break.   Excess fluid. Your body may absorb too much of the fluid that is held in the abdomen. This can lead to heart or lung problems.  SEEK MEDICAL CARE IF:   You have any problems with an exchange.   The area around the  catheter becomes red or painful.   The catheter seems loose, or it feels like it is coming out.   A bag of dialysate looks cloudy. Or, the liquid is an unusual color.   Abdominal pain or discomfort.   You feel sick to your stomach (nauseous) or throw up (vomit).   You develop a fever of more than 102 F (38.9 C).  SEEK IMMEDIATE MEDICAL CARE IF:  You develop a fever of more than 102 F (38.9 C). Document Released: 03/24/2009 Document Revised: 05/16/2011 Document Reviewed: 03/24/2009 [facility] Patient Information 2012 [place].  Diet for Peritoneal Dialysis This diet may be modified in protein, sodium, phosphorus, potassium, or fluid, depending on your needs. The goals of nutrition therapy are similar to those for patients on hemodialysis. Providing enough protein to replace peritoneal losses is a priority. USES OF THIS DIET The diet is designed for the patient with end-stage kidney (renal) disease, who is treated by peritoneal dialysis. Treatment options include:  Continuous Ambulatory Peritoneal  Dialysis (CAPD): Usually 4 exchanges of 1.5 to 2 liter volumes of glucose (sugar) and electrolyte-containing dialysate.   Continuous Cyclic Peritoneal Dialysis (CCPD): Essentially a reversal of CAPD, with shorter exchanges at night and a longer one during the day.   Intermittent Peritoneal Dialysis (IPD): 10 to 12 hours of exchanges, 2 to 3 times weekly.  ADEQUACY The diet may not meet the Recommended Dietary Allowances of the [employer] for calcium and ascorbic acid. Protein and water-soluble vitamin needs may be increased because of losses into the dialysate. Recommended daily supplements are the same as for hemodialysis patients. ASSESSMENT/DETERMINATION OF DIET Dietary needs will differ between patients. Parameters must be individualized. Protein  Guidelines: 1.2 to 1.3 gm/kg/day OR 1.5 gm/kg/day if patient is malnourished, catabolic, or has a protracted episode of  peritonitis. A minimum of 50% of the protein intake should be of high biological value.   Goals: Meet protein requirements and replace dialysate losses while avoiding excessive accumulation of waste products. Achieve serum albumin greater than 3.5 g/dL.   Evaluate: Current nutritional status, serum albumin and BUN levels, presence of peritonitis.  Sodium  Guidelines: Usually 90 to 175 mEq (2000 to 4000 mg), but should be individualized.   Goals: Minimize complications of fluid imbalance.   Evaluate: Weight, blood pressure regulation, and presence of swelling (edema).  Potassium  Guidelines: Individualized; often not restricted, and may need to be supplemented.   Goals: Serum K+ levels between 4.0 to 5.0 mEq/L.   Evaluate: Serum K+ levels, usual intake of K+, appetite.  Phosphorus  Guidelines: 800 to 1200 mg/day (the high protein intake results in a high obligatory P intake).   Goal: Serum P levels between 4.5 to 6.0 mg/dL.   Evaluate: Serum P levels, usual P intake, P-binding medications: type, number, dosage, distribution.  Fluids  Guidelines: Individualized - may not be restricted for all patients.   Goal: Minimize complications of fluid imbalance.   Evaluate: Weight, blood pressure regulation, sodium intake, and presence of edema.  Document Released: 05/27/2005 Document Revised: 05/16/2011 Document Reviewed: 08/19/2006 [facility] Patient Information 2012 [place].  Peritoneal Dialysis Dialysis is a procedure that replaces some of the work healthy kidneys do. It is done when you lose about 85 90% of your kidney function, and sometimes earlier if your symptoms may be improved by dialysis. During dialysis, wastes, salt, and extra water are removed from the blood and the level of certain chemicals in the blood (such as potassium) is maintained. Peritoneal dialysis is a type of dialysis in which the thin lining of the abdomen (peritoneum) and a fluid called dialysate are used  to do these tasks.  Before beginning peritoneal dialysis you will have surgery to place a thin, plastic tube (catheter) in your abdomen. The catheter will be small, soft, and easy to conceal. It will be used to transfer a fluid called dialysate to and from your abdomen during each session. The surgery is usually done at least 2 weeks before you begin dialysis. HOW DOES PERITONEAL DIALYSIS WORK? At the start of a session, your abdomen is filled with dialysate. Dialysate contains a sugar that pulls wastes, salt, and extra water from the blood. These substances pass through the peritoneum and into the dialysate over the course of several hours. At the end of the session the dialysate is drained from the body. The abdomen is then refilled with dialysate and the procedure is repeated. The process of draining and filling the abdomen with dialysate is called an exchange. The  time the dialysate is in your body between exchanges is called a dwell. The dwell depends on the number of exchanges needed, the type of dialysate used, and the characteristics of the peritoneum. It usually varies from 1.5 3 hours. Peritoneal dialysis is done during the day or at night while you are sleeping. Peritoneal dialysis that is done during the day is called continuous ambulatory peritoneal dialysis (CAPD). In CAPD, exchanges are performed up to 5 times a day. Each exchange takes about 30 40 minutes. You may go about your day normally between exchanges. Peritoneal dialysis that is done at night while you are sleeping is called continuous cycling peritoneal dialysis (CCPD).In CCPD, a machine called a cycler performs exchanges while you are sleeping. Sometimes a combination of CAPD and CCPD is needed. RISKS AND COMPLICATIONS Generally, peritoneal dialysis is a safe procedure. However, as with any procedure, complications can occur. Possible complications include:  Infection in the peritoneum. This is the most common complication of  peritoneal dialysis.  Infection around the site of catheter insertion.  Weakened abdominal muscles. This may lead to a hernia, which can in turn have complications if left untreated. PREPARING FOR AN EXCHANGE  Close doors and windows and turn off any fans before performing an exchange. Make sure you are in a space without drafts or air currents. Doing these things reduces your risk of infection.  Wear a mask to cover your nose and mouth. Do this before you wash your hands and handle equipment. Keep the mask on during each exchange.  Wash your hands thoroughly before each exchange. Use a gel or foam.  Make sure your catheter and its cap are sterile. If you are using a cycler, make sure the device that attaches the dialysate bag and tubing to the catheter (adapter) is also sterile.  Make sure the area of your body around the catheter is sterile.  Check your blood pressure if directed by your health care provider. Your blood pressure reading may help determine what type of dialysate to use.  Examine the dialysate:  Make sure the bag is the right size and contains the correct mixture. Size information is on the label.   Gently squeeze the bag to make sure there are no leaks.   Look at the color of the dialysate. The dialysate should be clear. You should be able to see any writing on the side of the bag clearly through the solution. Do not use a cloudy solution.  Check the expiration date. The expiration date is on the label. If it is past the expiration date, throw the bag away.  Warm the dialysate using a dry heating pad. Leave the dialysate in the bag with the cover on while doing this. Do not use a microwave or hot water to warm the dialysate.  If you are using a cycler, make sure it is set up and programmed correctly. HOW TO PERFORM AN EXCHANGE The following steps illustrate how exchanges are commonly performed. The actual way in which an exchange should be performed varies  depending on the equipment used and other factors. Always perform exchanges in exactly the way that your health care provider has trained you to. Remember to always wash your hands before touching any tubing. This is extremely important in preventing infection. CAPD 1. Hang your bag of dialysate above the level of your abdomen on the IV pole. 2. Place a drain bag below your abdomen. 3. Pull out the ring of y-shaped tubing that connects both bags.  4. Uncap the tube that is connected to your catheter (transfer set) and immediately attach it to the y-shaped tubing of the dialysate and drain bags. 5. Twist open the clamp on the transfer set. This will cause the fluid in your abdomen to drain through the tube that goes to the drain bag (drain line). It usually takes about 20 minutes for all of the fluid to drain. 6. Once the fluid has finished draining, twist lock the clamp of the transfer set. Then clamp the drain line. 7. Break the seal (frangible) on the tubing that is connected to the dialysate bag (fill line) and then unclamp the drain line. Air bubbles will flow into the drain line. Make sure your transfer set is still locked so that no air gets into the abdomen. 8. Count to 5 and then clamp the drain line. 9. Twist the transfer set clamp to open it and allow the dialysate to flow into your abdomen. This should take about 10 minutes. 10. Once the fill is complete, twist the transfer set clamp to lock it and then clamp the tubing of the fill line. 11. Detach the tubing from the catheter and immediately cap the catheter transfer set. Secure the transfer set to your abdomen wall using tape as instructed. 12. Inspect the drainage. The fluid may look like urine, but it should be clear. 13. Weigh the drain bag and record the volume drained. 14. Check your blood pressure, temperature, and pulse if it is the first exchange of the day. 15. Allow the solution to stay in the abdomen for as long as directed by  your health care provider. While the solution is in your abdomen, you may go about your day. If your body cannot go all night without an exchange, a cycler may be used to perform exchanges while you sleep. The cycler is similar to the one used during CCP[facility] but is smaller. Using a cycler allows you to sleep without having to get up and do an exchange. [facility]CPD 1. When you are ready for bed, put the dialysate bags onto the cycler. Put on exactly the number of bags that your health care provider said to use.  2. Cyclers have a small cartridge (cassette) with tubing that attaches to each dialysate bag and the tube that goes to the patient (patient line). Depending on the number of dialysate bags you are to use, all the tubes on the cassette may need to be connected to separate dialysate bags. If you use fewer bags than there are tubes on the cassette, the extra lines will need to be clamped. 3. Insert the cassette with the attached tubing into the front of the cycler. 4. Make sure that the drain line extends to the toilet. The tip of the drain line should not touch the water in the toilet bowl. 5. Pull the ring on the tubing from the dialysate bag on the heating pad of the cycler and attach it to the first tube of the cassette. Then break the frangible on the tubing of this dialysate bag. 6. Repeat this process with all the bags you need to use. 7. Start the cycler. This will prepare (prime) the cycler and tubing by filling it with dialysate and getting rid of all the air in the tubes. 8. Once all lines are primed, the cycler will instruct you to connect yourself. 9. Remove the pull ring from the patient line with one hand. 10. Uncap your transfer set and attach it to the  patient line. 11. Twist open the clamp on the transfer set. 12. Press "GO" on the cycler and it will begin the draining and filling process. The cycler may do 3 5 exchanges overnight, depending on what your health care provider  recommended. 13. In the morning, record all volumes and times shown on the cycler. 14. Press "GO" on the cycler, which will then instruct you to close all clamps. 15. Close your transfer set twist clamp and then clamp all the tubes that go to the cycler. 16. When indicated by the cycler, disconnect the patient line from the transfer set and immediately re-cap your transfer set. 17. Secure the transfer set to your abdomen wall using tape as instructed. 18. Check your blood pressure, temperature, and pulse. The solution that is in your abdomen in the morning will stay there during the day. If the body cannot go all day without an exchange, you may need to perform an exchange during the day. Follow the steps under CAPD if an extra exchange is needed. HOME CARE INSTRUCTIONS  Follow diet instructions as directed by your health care provider.  Avoid becoming constipated. Constipation prevents dialysate from draining effectively after a dwell. To prevent constipation, make sure you are eating fiber-rich foods and avoiding foods that cause constipation. Increasing your physical activity and going to the restroom when you feel you need to instead of holding it in can also prevent constipation. If your health care provider recommends drugs to prevent constipation such as laxatives, use them only as directed.  Always keep the dialysate bags and other supplies in a cool, clean, and dry place.  Keep a strict schedule. Dialysis must be done every day. Do not skip a day or an exchange. Make sure to make time for each exchange.  Weigh yourself every day. Sudden weight gain may be a sign of a problem.  Take medicines as directed by your health care provider. SEEK MEDICAL CARE IF:   You have a fever or chills.  You feel sick to your stomach (nauseous) or throw up (vomit).  You have diarrhea.  You have any problems with an exchange.   Your blood pressure increases.  You suddenly gain weight or feel  short of breath.  The catheter seems loose or feels like it is coming out.   The fluid that has drained from your abdomen is pinkish or reddish. However, women having their menstrual period do not need to seek medical care if the fluid is only a little pink or red.  There are white strands in the dialysate that are large enough to get stuck in your tubing or catheter. SEEK IMMEDIATE MEDICAL CARE IF:   The area around the catheter swells or becomes red, tender, or painful.  There is pus coming from the catheter site.  The fluid that has drained from your abdomen is cloudy.  You feel abdominal pain or discomfort. Document Released: 03/24/2009 Document Revised: 01/27/2013 Document Reviewed: 10/22/2012 [facility] Patient Information 2014 [place].

## 2013-07-26 ENCOUNTER — Encounter (HOSPITAL_COMMUNITY): Payer: Self-pay | Admitting: Pharmacy Technician

## 2013-07-27 ENCOUNTER — Encounter (HOSPITAL_COMMUNITY): Payer: Self-pay

## 2013-07-27 ENCOUNTER — Encounter (HOSPITAL_COMMUNITY)
Admission: RE | Admit: 2013-07-27 | Discharge: 2013-07-27 | Disposition: A | Discharge status: Home / Self Care | Payer: 59 | Source: Ambulatory Visit | Attending: Surgery | Admitting: Surgery

## 2013-07-27 DIAGNOSIS — Z01810 Encounter for preprocedural cardiovascular examination: Secondary | ICD-10-CM | POA: Diagnosis present

## 2013-07-27 DIAGNOSIS — Z01812 Encounter for preprocedural laboratory examination: Secondary | ICD-10-CM | POA: Insufficient documentation

## 2013-07-27 HISTORY — DX: Personal history of other medical treatment: Z92.89

## 2013-07-27 HISTORY — DX: Diarrhea, unspecified: R19.7

## 2013-07-27 HISTORY — DX: Personal history of other diseases of the musculoskeletal system and connective tissue: Z87.39

## 2013-07-27 HISTORY — DX: Anemia, unspecified: D64.9

## 2013-07-27 HISTORY — DX: Personal history of other diseases of the respiratory system: Z87.09

## 2013-07-27 HISTORY — DX: Hyperlipidemia, unspecified: E78.5

## 2013-07-27 HISTORY — DX: Paresthesia of skin: R20.2

## 2013-07-27 HISTORY — DX: Pneumonia, unspecified organism: J18.9

## 2013-07-27 LAB — CBC
HCT: 37.5 % — ABNORMAL LOW (ref 39.0–52.0)
Hemoglobin: 12.1 g/dL — ABNORMAL LOW (ref 13.0–17.0)
MCH: 32.6 pg (ref 26.0–34.0)
MCHC: 32.3 g/dL (ref 30.0–36.0)
MCV: 101.1 fL — ABNORMAL HIGH (ref 78.0–100.0)
PLATELETS: 232 10*3/uL (ref 150–400)
RBC: 3.71 MIL/uL — ABNORMAL LOW (ref 4.22–5.81)
RDW: 13.5 % (ref 11.5–15.5)
WBC: 9.7 10*3/uL (ref 4.0–10.5)

## 2013-07-27 LAB — BASIC METABOLIC PANEL
BUN: 59 mg/dL — ABNORMAL HIGH (ref 6–23)
CALCIUM: 9.8 mg/dL (ref 8.4–10.5)
CHLORIDE: 101 meq/L (ref 96–112)
CO2: 26 mEq/L (ref 19–32)
Creatinine, Ser: 9.1 mg/dL — ABNORMAL HIGH (ref 0.50–1.35)
GFR calc Af Amer: 7 mL/min — ABNORMAL LOW (ref >90)
GFR, EST NON AFRICAN AMERICAN: 6 mL/min — AB (ref >90)
Glucose, Bld: 95 mg/dL (ref 70–99)
Potassium: 4.6 mEq/L (ref 3.7–5.3)
Sodium: 146 mEq/L (ref 137–147)

## 2013-07-27 MED ORDER — CHLORHEXIDINE GLUCONATE 4 % EX LIQD: 1.0000 "application " | Freq: Once | CUTANEOUS | Status: DC

## 2013-07-27 NOTE — Pre-Procedure Instructions (Signed)
Mario Woods.  07/27/2013   Your procedure is scheduled on:  Tues, Feb 24 @ 7:15 AM  Report to [facility] Entrance A  at 5:30 AM.  Call this number if you have problems the morning of surgery: [phone]   Remember:   Do not eat food or drink liquids after midnight.   Take these medicines the morning of surgery with A SIP OF WATER: Cellcept(Mycophenotate),Prednisone(Deltasone),and Prograf(Tacrolimus)                Stop taking your Aspirin. No Goody's,BC's,Aleve,Ibuprofen,Fish Oil,or any Herbal Medications   Do not wear jewelry  Do not wear lotions, powders, or colognes. You may wear deodorant.  Men may shave face and neck.  Do not bring valuables to the hospital.  [facility] is not responsible                  for any belongings or valuables.               Contacts, dentures or bridgework may not be worn into surgery.  Leave suitcase in the car. After surgery it may be brought to your room.  For patients admitted to the hospital, discharge time is determined by your                treatment team.                 Special Instructions:  [place] - Preparing for Surgery  Before surgery, you can play an important role.  Because skin is not sterile, your skin needs to be as free of germs as possible.  You can reduce the number of germs on you skin by washing with CHG (chlorahexidine gluconate) soap before surgery.  CHG is an antiseptic cleaner which kills germs and bonds with the skin to continue killing germs even after washing.  Please DO NOT use if you have an allergy to CHG or antibacterial soaps.  If your skin becomes reddened/irritated stop using the CHG and inform your nurse when you arrive at Short Stay.  Do not shave (including legs and underarms) for at least 48 hours prior to the first CHG shower.  You may shave your face.  Please follow these instructions carefully:   1.  Shower with CHG Soap the night before surgery and the                                 morning of Surgery.  2.  If you choose to wash your hair, wash your hair first as usual with your       normal shampoo.  3.  After you shampoo, rinse your hair and body thoroughly to remove the                      Shampoo.  4.  Use CHG as you would any other liquid soap.  You can apply chg directly       to the skin and wash gently with scrungie or a clean washcloth.  5.  Apply the CHG Soap to your body ONLY FROM THE NECK DOWN.        Do not use on open wounds or open sores.  Avoid contact with your eyes,       ears, mouth and genitals (private parts).  Wash genitals (private parts)       with your normal  soap.  6.  Wash thoroughly, paying special attention to the area where your surgery        will be performed.  7.  Thoroughly rinse your body with warm water from the neck down.  8.  DO NOT shower/wash with your normal soap after using and rinsing off       the CHG Soap.  9.  Pat yourself dry with a clean towel.            10.  Wear clean pajamas.            11.  Place clean sheets on your bed the night of your first shower and do not        sleep with pets.  Day of Surgery  Do not apply any lotions/deoderants the morning of surgery.  Please wear clean clothes to the hospital/surgery center.     Please read over the following fact sheets that you were given: Pain Booklet, Coughing and Deep Breathing and Surgical Site Infection Prevention

## 2013-07-27 NOTE — Progress Notes (Addendum)
Cardiologist was Dr.Tennant with last visit in 2010  Heart cath done in 1999     Echo/Stress test reports in epic from 2010  CXR in epic from 02-15-13  [place] is Dr.Kurt [name]  Denies EKG in past yr

## 2013-07-28 NOTE — Progress Notes (Signed)
Anesthesia Chart Review: Patient is a 53 year old male posted for laparoscopic exploration of the abdomen with placement of peritoneal dialysis catheter, possible repair of hernia on 08/03/13 by Dr. Johney Maine.  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 He reported a sleep study > 10 years ago that did not confirm OSA--he is not on CPAP. Nephrologist is Dr. Lorrene Reid. PCP is Dr. Robyn Haber.  EKG on 07/27/13 showed NSR.  Nuclear stress test on 10/06/08 was normal, showing no ischemia, EF 53%.   Echo on 09/29/08 showed moderate LVH, normal LV systolic function, EF 0000000, moderate LAE, mild RAE, mild aortic sclerosis, ST with mild septal dyssynergy, trace MR/TR.   CXR on 02/15/13 showed no acute cardiopulmonary findings.   Labs noted. BUN/Cr 59/9.10. K 4.6. H/H 12.1/37.5.  He will get an ISTAT on arrival. If results are acceptable than anticipate that he can proceed as planned.  Mario Woods [facility] Short Stay Center/Anesthesiology Phone [phone] 07/28/2013 9:37 AM

## 2013-08-02 MED ORDER — CEFAZOLIN SODIUM 10 G IJ SOLR
3.0000 g | INTRAMUSCULAR | Status: AC
  Administered 2013-08-03: 3 g via INTRAVENOUS
  Filled 2013-08-02: qty 3000

## 2013-08-02 MED ORDER — GENTAMICIN IN SALINE 1-0.9 MG/ML-% IV SOLN
100.0000 mg | INTRAVENOUS | Status: DC
  Filled 2013-08-02: qty 100

## 2013-08-02 MED ORDER — GENTAMICIN SULFATE 40 MG/ML IJ SOLN
200.0000 mg | INTRAVENOUS | Status: AC
  Administered 2013-08-03: 200 mg via INTRAVENOUS
  Filled 2013-08-02: qty 5

## 2013-08-03 ENCOUNTER — Ambulatory Visit (HOSPITAL_COMMUNITY)
Admission: RE | Admit: 2013-08-03 | Discharge: 2013-08-03 | Disposition: A | Discharge status: Home / Self Care | Payer: Medicare Other | Source: Ambulatory Visit | Attending: Surgery | Admitting: Surgery

## 2013-08-03 ENCOUNTER — Encounter (HOSPITAL_COMMUNITY): Payer: Medicare Other | Admitting: Vascular Surgery

## 2013-08-03 ENCOUNTER — Encounter (HOSPITAL_COMMUNITY)
Admission: RE | Disposition: A | Discharge status: Home / Self Care | Payer: Self-pay | Source: Ambulatory Visit | Attending: Surgery

## 2013-08-03 ENCOUNTER — Ambulatory Visit (HOSPITAL_COMMUNITY): Payer: Medicare Other | Admitting: Certified Registered Nurse Anesthetist

## 2013-08-03 DIAGNOSIS — G4730 Sleep apnea, unspecified: Secondary | ICD-10-CM | POA: Insufficient documentation

## 2013-08-03 DIAGNOSIS — N186 End stage renal disease: Secondary | ICD-10-CM

## 2013-08-03 DIAGNOSIS — E785 Hyperlipidemia, unspecified: Secondary | ICD-10-CM | POA: Insufficient documentation

## 2013-08-03 DIAGNOSIS — Z992 Dependence on renal dialysis: Secondary | ICD-10-CM | POA: Insufficient documentation

## 2013-08-03 DIAGNOSIS — Z940 Kidney transplant status: Secondary | ICD-10-CM | POA: Insufficient documentation

## 2013-08-03 DIAGNOSIS — E669 Obesity, unspecified: Secondary | ICD-10-CM | POA: Diagnosis present

## 2013-08-03 DIAGNOSIS — I120 Hypertensive chronic kidney disease with stage 5 chronic kidney disease or end stage renal disease: Secondary | ICD-10-CM | POA: Insufficient documentation

## 2013-08-03 DIAGNOSIS — K660 Peritoneal adhesions (postprocedural) (postinfection): Secondary | ICD-10-CM | POA: Insufficient documentation

## 2013-08-03 DIAGNOSIS — Z87891 Personal history of nicotine dependence: Secondary | ICD-10-CM | POA: Insufficient documentation

## 2013-08-03 HISTORY — PX: CAPD INSERTION: SHX5233

## 2013-08-03 LAB — POCT I-STAT 4, (NA,K, GLUC, HGB,HCT)
Glucose, Bld: 93 mg/dL (ref 70–99)
HCT: 36 % — ABNORMAL LOW (ref 39.0–52.0)
HEMOGLOBIN: 12.2 g/dL — AB (ref 13.0–17.0)
POTASSIUM: 4.3 meq/L (ref 3.7–5.3)
Sodium: 142 mEq/L (ref 137–147)

## 2013-08-03 SURGERY — LAPAROSCOPIC INSERTION CONTINUOUS AMBULATORY PERITONEAL DIALYSIS  (CAPD) CATHETER
Anesthesia: General | Site: Abdomen

## 2013-08-03 MED ORDER — FENTANYL CITRATE 0.05 MG/ML IJ SOLN: 25.0000 ug | INTRAMUSCULAR | Status: DC | PRN

## 2013-08-03 MED ORDER — 0.9 % SODIUM CHLORIDE (POUR BTL) OPTIME
TOPICAL | Status: DC | PRN
  Administered 2013-08-03 (×2): 1000 mL

## 2013-08-03 MED ORDER — PROPOFOL 10 MG/ML IV BOLUS
INTRAVENOUS | Status: DC | PRN
  Administered 2013-08-03: 200 mg via INTRAVENOUS

## 2013-08-03 MED ORDER — ARTIFICIAL TEARS OP OINT
TOPICAL_OINTMENT | OPHTHALMIC | Status: DC | PRN
  Administered 2013-08-03: 1 via OPHTHALMIC

## 2013-08-03 MED ORDER — HYDROMORPHONE HCL PF 1 MG/ML IJ SOLN: 0.2500 mg | INTRAMUSCULAR | Status: DC | PRN

## 2013-08-03 MED ORDER — ROCURONIUM BROMIDE 100 MG/10ML IV SOLN
INTRAVENOUS | Status: DC | PRN
  Administered 2013-08-03: 20 mg via INTRAVENOUS

## 2013-08-03 MED ORDER — ROCURONIUM BROMIDE 50 MG/5ML IV SOLN
INTRAVENOUS | Status: AC
  Filled 2013-08-03: qty 1

## 2013-08-03 MED ORDER — SODIUM CHLORIDE 0.9 % IV SOLN
INTRAVENOUS | Status: DC | PRN
  Administered 2013-08-03 (×2): via INTRAVENOUS

## 2013-08-03 MED ORDER — BUPIVACAINE-EPINEPHRINE (PF) 0.25% -1:200000 IJ SOLN
INTRAMUSCULAR | Status: AC
  Filled 2013-08-03: qty 30

## 2013-08-03 MED ORDER — STERILE WATER FOR INJECTION IJ SOLN
INTRAMUSCULAR | Status: AC
  Filled 2013-08-03: qty 10

## 2013-08-03 MED ORDER — ONDANSETRON HCL 4 MG/2ML IJ SOLN
INTRAMUSCULAR | Status: AC
  Filled 2013-08-03: qty 2

## 2013-08-03 MED ORDER — TRAMADOL HCL 50 MG PO TABS: 50.0000 mg | ORAL_TABLET | Freq: Four times a day (QID) | ORAL | Status: DC | PRN

## 2013-08-03 MED ORDER — NEOSTIGMINE METHYLSULFATE 1 MG/ML IJ SOLN
INTRAMUSCULAR | Status: DC | PRN
Start: 2013-08-03 — End: 2013-08-03
  Administered 2013-08-03: 4 mg via INTRAVENOUS

## 2013-08-03 MED ORDER — ONDANSETRON HCL 4 MG/2ML IJ SOLN
INTRAMUSCULAR | Status: DC | PRN
  Administered 2013-08-03: 4 mg via INTRAVENOUS

## 2013-08-03 MED ORDER — ONDANSETRON HCL 4 MG/2ML IJ SOLN
4.0000 mg | Freq: Four times a day (QID) | INTRAMUSCULAR | Status: DC | PRN
  Filled 2013-08-03: qty 2

## 2013-08-03 MED ORDER — LIDOCAINE HCL (CARDIAC) 20 MG/ML IV SOLN
INTRAVENOUS | Status: AC
  Filled 2013-08-03: qty 5

## 2013-08-03 MED ORDER — MIDAZOLAM HCL 5 MG/5ML IJ SOLN
INTRAMUSCULAR | Status: DC | PRN
  Administered 2013-08-03: 2 mg via INTRAVENOUS

## 2013-08-03 MED ORDER — GLYCOPYRROLATE 0.2 MG/ML IJ SOLN
INTRAMUSCULAR | Status: AC
  Filled 2013-08-03: qty 3

## 2013-08-03 MED ORDER — SUCCINYLCHOLINE CHLORIDE 20 MG/ML IJ SOLN
INTRAMUSCULAR | Status: DC | PRN
  Administered 2013-08-03: 120 mg via INTRAVENOUS

## 2013-08-03 MED ORDER — LIDOCAINE HCL (CARDIAC) 20 MG/ML IV SOLN
INTRAVENOUS | Status: DC | PRN
  Administered 2013-08-03: 100 mg via INTRAVENOUS

## 2013-08-03 MED ORDER — GLYCOPYRROLATE 0.2 MG/ML IJ SOLN
INTRAMUSCULAR | Status: DC | PRN
  Administered 2013-08-03: .6 mg via INTRAVENOUS

## 2013-08-03 MED ORDER — MIDAZOLAM HCL 2 MG/2ML IJ SOLN
INTRAMUSCULAR | Status: AC
  Filled 2013-08-03: qty 2

## 2013-08-03 MED ORDER — BUPIVACAINE-EPINEPHRINE 0.25% -1:200000 IJ SOLN
INTRAMUSCULAR | Status: DC | PRN
  Administered 2013-08-03: 11 mL

## 2013-08-03 MED ORDER — FENTANYL CITRATE 0.05 MG/ML IJ SOLN
INTRAMUSCULAR | Status: DC | PRN
  Administered 2013-08-03: 125 ug via INTRAVENOUS
  Administered 2013-08-03: 25 ug via INTRAVENOUS

## 2013-08-03 MED ORDER — PROPOFOL 10 MG/ML IV BOLUS
INTRAVENOUS | Status: AC
  Filled 2013-08-03: qty 20

## 2013-08-03 MED ORDER — SODIUM CHLORIDE 0.9 % IR SOLN
Status: DC | PRN
  Administered 2013-08-03: 07:00:00

## 2013-08-03 MED ORDER — EPHEDRINE SULFATE 50 MG/ML IJ SOLN
INTRAMUSCULAR | Status: AC
  Filled 2013-08-03: qty 1

## 2013-08-03 MED ORDER — FENTANYL CITRATE 0.05 MG/ML IJ SOLN
INTRAMUSCULAR | Status: AC
  Filled 2013-08-03: qty 5

## 2013-08-03 SURGICAL SUPPLY — 56 items
ADAPTER CATH SAFE LCK II CO PK (MISCELLANEOUS) IMPLANT
ADAPTER SAFE LOCK II CO PACK (MISCELLANEOUS)
ADAPTER TITANIUM MEDIONICS (MISCELLANEOUS) ×3 IMPLANT
BAG DECANTER FOR FLEXI CONT (MISCELLANEOUS) ×3 IMPLANT
BLADE SURG ROTATE 9660 (MISCELLANEOUS) ×3 IMPLANT
CANISTER SUCTION 2500CC (MISCELLANEOUS) IMPLANT
CATH EXTENDED DIALYSIS (CATHETERS) ×3 IMPLANT
CATH MONCRIEF POPOVICH (CATHETERS) IMPLANT
CHLORAPREP W/TINT 26ML (MISCELLANEOUS) ×3 IMPLANT
COIL SWAN NECK LT (MISCELLANEOUS) IMPLANT
COIL SWAN NECK RT (MISCELLANEOUS) IMPLANT
COVER SURGICAL LIGHT HANDLE (MISCELLANEOUS) ×3 IMPLANT
DECANTER SPIKE VIAL GLASS SM (MISCELLANEOUS) IMPLANT
DISSECTOR BLUNT TIP ENDO 5MM (MISCELLANEOUS) IMPLANT
DRAPE WARM FLUID 44X44 (DRAPE) ×3 IMPLANT
DRSG OPSITE 4X5.5 SM (GAUZE/BANDAGES/DRESSINGS) IMPLANT
DRSG PAD ABDOMINAL 8X10 ST (GAUZE/BANDAGES/DRESSINGS) IMPLANT
DRSG TEGADERM 2-3/8X2-3/4 SM (GAUZE/BANDAGES/DRESSINGS) ×12 IMPLANT
DRSG TEGADERM 4X4.75 (GAUZE/BANDAGES/DRESSINGS) ×6 IMPLANT
ELECT REM PT RETURN 9FT ADLT (ELECTROSURGICAL) ×3
ELECTRODE REM PT RTRN 9FT ADLT (ELECTROSURGICAL) ×2 IMPLANT
GAUZE SPONGE 2X2 8PLY STRL LF (GAUZE/BANDAGES/DRESSINGS) ×2 IMPLANT
GLOVE BIO SURGEON STRL SZ7.5 (GLOVE) ×3 IMPLANT
GLOVE BIOGEL PI IND STRL 7.5 (GLOVE) ×6 IMPLANT
GLOVE BIOGEL PI IND STRL 8 (GLOVE) ×2 IMPLANT
GLOVE BIOGEL PI INDICATOR 7.5 (GLOVE) ×3
GLOVE BIOGEL PI INDICATOR 8 (GLOVE) ×1
GLOVE ECLIPSE 7.5 STRL STRAW (GLOVE) ×3 IMPLANT
GLOVE ECLIPSE 8.0 STRL XLNG CF (GLOVE) ×3 IMPLANT
GOWN STRL REUS W/ TWL LRG LVL3 (GOWN DISPOSABLE) ×4 IMPLANT
GOWN STRL REUS W/ TWL XL LVL3 (GOWN DISPOSABLE) ×2 IMPLANT
GOWN STRL REUS W/TWL LRG LVL3 (GOWN DISPOSABLE) ×2
GOWN STRL REUS W/TWL XL LVL3 (GOWN DISPOSABLE) ×1
KIT BASIN OR (CUSTOM PROCEDURE TRAY) ×3 IMPLANT
KIT ROOM TURNOVER OR (KITS) ×3 IMPLANT
NEEDLE 22X1 1/2 (OR ONLY) (NEEDLE) ×3 IMPLANT
NS IRRIG 1000ML POUR BTL (IV SOLUTION) ×6 IMPLANT
PAD ARMBOARD 7.5X6 YLW CONV (MISCELLANEOUS) ×6 IMPLANT
SET EXTENSION TUBING 8  CATH (SET/KITS/TRAYS/PACK) IMPLANT
SET IRRIG TUBING LAPAROSCOPIC (IRRIGATION / IRRIGATOR) IMPLANT
SLEEVE ENDOPATH XCEL 5M (ENDOMECHANICALS) ×3 IMPLANT
SPONGE GAUZE 2X2 STER 10/PKG (GAUZE/BANDAGES/DRESSINGS) ×1
SPONGE GAUZE 4X4 12PLY (GAUZE/BANDAGES/DRESSINGS) ×3 IMPLANT
STYLET FALLER MEDIONICS (MISCELLANEOUS) ×3 IMPLANT
SUT MNCRL AB 4-0 PS2 18 (SUTURE) ×3 IMPLANT
SUT PROLENE 2 0 CT2 30 (SUTURE) ×9 IMPLANT
SUT SILK 2 0 SH (SUTURE) ×3 IMPLANT
SYR 50ML SLIP (SYRINGE) ×6 IMPLANT
TOWEL OR 17X24 6PK STRL BLUE (TOWEL DISPOSABLE) ×3 IMPLANT
TOWEL OR 17X26 10 PK STRL BLUE (TOWEL DISPOSABLE) ×3 IMPLANT
TOWEL OR NON WOVEN STRL DISP B (DISPOSABLE) ×3 IMPLANT
TRAY LAPAROSCOPIC (CUSTOM PROCEDURE TRAY) ×3 IMPLANT
TROCAR FALLER TUNNELING (TROCAR) IMPLANT
TROCAR XCEL NON BLADE 8MM B8LT (ENDOMECHANICALS) ×3 IMPLANT
TROCAR XCEL NON-BLD 11X100MML (ENDOMECHANICALS) ×3 IMPLANT
TROCAR XCEL NON-BLD 5MMX100MML (ENDOMECHANICALS) ×3 IMPLANT

## 2013-08-03 NOTE — Anesthesia Preprocedure Evaluation (Addendum)
Anesthesia Evaluation  Patient identified by MRN, date of birth, ID band Patient awake    Airway Mallampati: II      Dental   Pulmonary sleep apnea , pneumonia -, former smoker,  breath sounds clear to auscultation        Cardiovascular hypertension, Rhythm:Regular Rate:Normal     Neuro/Psych    GI/Hepatic   Endo/Other    Renal/GU Renal disease     Musculoskeletal   Abdominal   Peds  Hematology   Anesthesia Other Findings   Reproductive/Obstetrics                          Anesthesia Physical Anesthesia Plan  ASA: III  Anesthesia Plan: General   Post-op Pain Management:    Induction: Intravenous  Airway Management Planned: Oral ETT  Additional Equipment:   Intra-op Plan:   Post-operative Plan: Possible Post-op intubation/ventilation  Informed Consent: I have reviewed the patients History and Physical, chart, labs and discussed the procedure including the risks, benefits and alternatives for the proposed anesthesia with the patient or authorized representative who has indicated his/her understanding and acceptance.   Dental advisory given  Plan Discussed with: CRNA, Anesthesiologist and Surgeon  Anesthesia Plan Comments:         Anesthesia Quick Evaluation

## 2013-08-03 NOTE — Anesthesia Postprocedure Evaluation (Signed)
  Anesthesia Post-op Note  Patient: Mario Woods  Procedure(s) Performed: Procedure(s): LAPAROSCOPIC EXPLORATION OF THE ABDOMEN WITH PLACEMENT OF PERITONEAL DIALYSIS CATHETER (N/A)  Patient Location: PACU  Anesthesia Type:General  Level of Consciousness: awake  Airway and Oxygen Therapy: Patient Spontanous Breathing  Post-op Pain: mild  Post-op Assessment: Post-op Vital signs reviewed  Post-op Vital Signs: Reviewed  Complications: No apparent anesthesia complications

## 2013-08-03 NOTE — Discharge Instructions (Signed)
PERITONEAL DIALYSIS (CAPD) CATHETER PLACEMENT:  POST OPERATIVE INSTRUCTIONS  1. DIET: Follow a light bland diet the first 24 hours after arrival home, such as soup, liquids, crackers, etc.  Be sure to include lots of fluids daily.  Avoid fast food or heavy meals as your are more likely to get nauseated.   2. Take your usually prescribed home medications unless otherwise directed. 3. PAIN CONTROL: a. Pain is best controlled by a usual combination of three different methods TOGETHER: i. Ice/Heat ii. Tylenol (over the counter pain medication) iii. Prescription pain medication b. Most patients will experience some swelling and bruising around the incisions.  Ice packs or heating pads (30-60 minutes up to 6 times a day) will help. Use ice for the first few days to help decrease swelling and bruising, then switch to heat to help relax tight/sore spots and speed recovery.  Some people prefer to use ice alone, heat alone, alternating between ice & heat.  Experiment to what works for you.  Swelling and bruising can take several weeks to resolve.   c. It is helpful to take an over-the-counter pain medication regularly for the first few weeks.  Using acetaminophen (Tylenol, etc) 500-650mg  four times a day (every meal & bedtime) is usually safest since NSAIDs are not advisable in patients with kidney disease. d. A  prescription for pain medication (such as oxycodone, hydrocodone, etc) should be given to you upon discharge.  Take your pain medication as prescribed.  i. If you are having problems/concerns with the prescription medicine (does not control pain, nausea, vomiting, rash, itching, etc), please call us [phone] to see if we need to switch you to a different pain medicine that will work better for you and/or control your side effect better. ii. If you need a refill on your pain medication, please contact your pharmacy.  They will contact our office to request authorization. Prescriptions will not be  filled after 5 pm or on week-ends. 4. Avoid getting constipated.  Between the surgery and the pain medications, it is common to experience some constipation.  Increasing fluid intake and taking a fiber supplement (such as Metamucil, Citrucel, FiberCon, MiraLax, etc) 1-2 times a day regularly will usually help prevent this problem from occurring.  A mild laxative (prune juice, Milk of Magnesia, MiraLax, etc) should be taken according to package directions if there are no bowel movements after 48 hours.   5. Wash / shower every day.  You may shower over the dressings as they are waterproof.  Continue to shower over incision(s) after the dressing is off. 6. The Peritoneal Dialysis nurse will remove your waterproof bandages in the Dialysis Center a few days after surgery.  Do not remove the bandages until seen by them. 7. ACTIVITIES as tolerated:   a. You may resume regular (light) daily activities beginning the next day--such as daily self-care, walking, climbing stairs--gradually increasing activities as tolerated.  If you can walk 30 minutes without difficulty, it is safe to try more intense activity such as jogging, treadmill, bicycling, low-impact aerobics, swimming, etc. b. Save the most intensive and strenuous activity for last such as sit-ups, heavy lifting, contact sports, etc  Refrain from any heavy lifting or straining until you are off narcotics for pain control.   c. DO NOT PUSH THROUGH PAIN.  Let pain be your guide: If it hurts to do something, don't do it.  Pain is your body warning you to avoid that activity for another week until the pain goes  down. d. You may drive when you are no longer taking prescription pain medication, you can comfortably wear a seatbelt, and you can safely maneuver your car and apply brakes. e. Dennis Bast may have sexual intercourse when it is comfortable.  FOLLOW UP with the Peritoneal Dialysis nurses closely after surgery.  Call [phone] to help arrange  training/flushes of cathete    -The CAPD nurses & Nephrology usually follow you closely, making the need for follow-up in our office redundant and therefore not needed.  If they or you have concerns, please call us for possible follow-up in our office  -Please call [facility] at [phone] only as needed.  WHEN TO CALL us [phone]: 1. Poor pain control 2. Reactions / problems with new medications (rash/itching, nausea, etc)  3. Fever over 101.5 F (38.5 C) 4. Worsening swelling or bruising 5. Continued bleeding from incision. 6. Increased pain, redness, or drainage from the incision   The clinic staff is available to answer your questions during regular business hours (8:30am-5pm).  Please dont hesitate to call and ask to speak to one of our nurses for clinical concerns.   If you have a medical emergency, go to the nearest emergency room or call 911.  A surgeon from [facility] is always on call at the hospitals  9. IF YOU HAVE DISABILITY OR FAMILY LEAVE FORMS, BRING THEM TO THE OFFICE FOR PROCESSING.  DO NOT GIVE THEM TO YOUR DOCTOR.  [facility], [address], [place], [place], [place]  [address] ? MAIN: [phone] ? TOLL FREE: [phone] ?  FAX [phone] [email or web address]  Peritoneal Dialysis - An Overview Dialysis can be done using a machine outside of the body (hemodialysis). Or, it can be done inside the body (peritoneal dialysis). The word "peritoneal" refers to the lining or membrane of the belly (abdominal cavity). The peritoneal membrane is a thin, plastic-like lining inside the belly that covers the organs and fits in the abdominal or peritoneal cavity, such as the stomach, liver and the kidneys. This lining works like a filter. It will allow certain things to pass from your blood through the lining and into a special solution that has been placed into your belly. In this type of dialysis, the peritoneum is used to  help clean the blood.  If you need dialysis, your kidneys are not working right. Healthy kidneys take out extra water and waste products, which becomes urine. When the kidneys do not do this, serious problems can develop. The waste and water build up in the blood. Your hands and feet might swell. You may feel tired, weak or sick to your stomach. Also, your blood pressure may rise. If not treated, you could die. Dialysis is a treatment that does the work that your kidneys would do if they were healthy.  It cleans your blood.   It will make sure your body has the right amount of certain chemicals that it needs. They include potassium, sodium and bicarbonate.   It will help control your blood pressure.  UNDERSTANDING PERITONEAL DIALYSIS  Here is how peritoneal dialysis works:   First, you will have surgery to put a soft plastic tube (catheter) into your belly (abdomen). This will allow you to easily connect yourself to special tubing, which will then let a special dialysis solution to be placed into your abdomen.   For each treatment, you will need at least one bag of dialysis solution (a liquid called dialysate). It is  a mix of water that is pure and free of germs (sterile), sugar (dextrose) and the nutrients and minerals found in your blood. Sometimes, more than one bag is needed to get the right amount of fluid for your abdomen. Your caregiver will explain what size and how many bags you will need.   The dialysate is slowly put through the catheter to fill the abdomen (called the peritoneal cavity). This dialysate will need to stay in your body for 3-4 hours. This is known as the dwell time.   The solution is working to clean the blood and remove wastes from your body. At the end of this time, the solution is drained from your body through tubing into an empty bag. It is then replaced with a fresh dialysate.   The draining and replacing of the dialysate is called an exchange or cycle. The  catheter is capped after each exchange. Once the solution is in your body, you are then free to do whatever you would like until the next exchange. Most people will need to do 4-5 exchanges each day.   There are two different methods that can be used.   Continuous ambulatory peritoneal dialysis (CAPD): You put the solution into your abdomen, cap your catheter and then go about your day. Several hours later, you reconnect to a tubing set up, drain out the solution and then put more solution in. This is done several times a day. No machine is needed.   Continuous cycler-assisted peritoneal dialysis (CCPD): A machine is used, which fills the abdomen with dialysate and then drains it. This happens several times. It usually is done at night while you are sleeping. When you wake up, you can disconnect from the machine and are free to go to go about your day.  PREPARING FOR EXCHANGES  Discuss the details of the procedure with your caregivers. You will be working with a nurse who is specially trained in doing dialysis. Make sure you understand:   How to do an exchange.   How much solution you need.   What type of solution you will need.   How often you should do an exchange. Ask:   How many times each day?   When? At meals? At bedtime?   Always keep the dialysate bags and other supplies in a cool, clean and dry place.   Keeping everything clean is very important.   The catheter and its cap must be free from germs (sterile)   The adapter also must be sterile. It attaches the dialysis bag and tubing to the catheter.   Clean the area of your body around the catheter every day. Use a chemical that fights infection (antiseptic).   Wash your hands thoroughly before starting an exchange.   You may be taught to wear a mask to cover your nose and mouth. This makes infection less likely to happen.   You may be taught to close doors, windows and turn off any fans before doing an exchange.   Check  the dialysate bag very carefully.   Make sure it is the right size bag for you. This information is on the label.   Also, make sure it is the right mixture. For some people, the dialysate contents vary. For instance, the mixture might be a stronger solution for overnight.   Check the expiration date (the last date you can use the bag). It also is on the label. If the date has gone by, throw away the bag.  The solution should be clear. You should be able to see any writing on the side of the bag clearly through the solution. Do not use a cloudy solution.   Gently squeeze the bag to make sure there are no leaks.   Use a dry heating pad to warm the dialysate in the bag. Leave the cover on the bag while you do this.   This is for comfort. You can skip this step if you want.   Never place the bag of solution under warm or hot water. Water from a faucet is not sterile and could cause germs to get into the bag. Infection could then result.  PERFORMING AN EXCHANGE  For continuous ambulatory dialysis:   Attach the dialysis bag and tubing to your catheter. Hang the bag so that gravity (the natural downward pull) draws the solution down and into your abdomen once the clamps are opened. This should take about 10 minutes.   Remove the bag and tubing from the catheter. Cap the catheter.   The solution stays in the abdomen for 3-4 hours (dwell time). The solution is working to clean the blood and remove wastes from your body.   When you are ready to drain the solution for another exchange, take the cap off the catheter. Then, attach the catheter to tubing, which is attached to an empty bag. Place this empty bag below the abdomen or on the floor or stool and undo the clamps.   Gravity helps pull the fluid out of the abdomen and into the bag. The fluid in the bag may look yellow and clear, like urine. It usually takes about 20 minutes to drain the fluid out of the abdomen.   When the solution has  drained, start the process again by infusing a new bag of dialysate and then capping the catheter.   This should continue until you have used all of the solution that you are to use each day.   Sometimes, a small machine is used overnight. It is called a mini-cycler. This is done if the body cannot go all night without an exchange. The machine lets you sleep without having to get up and do an exchange.   For continuous cycler-assisted dialysis:   You will be taught how to set up or program your machine.   When you are ready for bed, put the dialysate bags onto the cycler machine. Put on exactly the number of bags that your caregiver said to use.   Connect your catheter to the machine and turn the cycler machine on.   Overnight, the cycler will do several exchanges. It often does three to five, sometimes more.   Solution that is in your abdomen in the morning will stay during the day. The machine is set to make the daytime solution stronger, if that is needed.   In the morning, you will disconnect from the machine and cap your catheter and go about your day.   Sometimes, an extra exchange is done during the day. This may be needed to remove excess waste or fluid.  IMPORTANT REMINDERS  You will need to follow a very strict schedule. Every step of the dialysis procedure must be done every day. Sometimes, several times a day. Altogether, this might take an extra 2 hours or more. However, you must stick to the routine. Do not skip a day. Do not skip a procedure.   Some people find it helpful to work with a Social worker or Education officer, museum in  addition to the renal (kidney) nurse. They can help you figure out how to change your daily routine to fit in the dialysis sessions.   You may need to change your diet. Ask your caregiver for advice, or talk with a nutritionist about what you should and should not eat.   You will need to weigh yourself every day and keep track of what your weight is.   You  may be taught how to check your blood pressure before every exchange. Your blood pressure reading will help determine what type of solution to use. If your blood pressure is too high, you may need a stronger solution.  RISKS AND COMPLICATIONS  Possible problems vary, depending on the method you use. Your overall health also can have an effect. Problems that could develop because of dialysis include:  Infection. This is the most common problem. It could occur:   In the peritoneum. This is called peritonitis.   Around the catheter.   Weight gain. The dialysate contains a type of sugar known as dextrose. Dextrose has a lot of calories. The body takes in several hundred calories from this sugar each day.   Weakened muscles in the abdomen. This can result from all of the fluid that your body has to hold in the abdomen.   Catheter replacement. Sometimes, a new one has to be put in.   Change in dialysis method. Due to some complications, you may need to change to hemodialysis for a short time and have your dialysis done at a center.   Trouble adjusting to your new lifestyle. In some people, this leads to depression.   Sleep problems.   Dialysis-related amyloidosis. This sometimes occurs after 5 years of dialysis. Protein builds up in the blood. This can cause painful deposits on bones, joints and tendons (which connect muscle to bone). Or, it can cause hollow spots in bones that make them more likely to break.   Excess fluid. Your body may absorb too much of the fluid that is held in the abdomen. This can lead to heart or lung problems.  SEEK MEDICAL CARE IF:   You have any problems with an exchange.   The area around the catheter becomes red or painful.   The catheter seems loose, or it feels like it is coming out.   A bag of dialysate looks cloudy. Or, the liquid is an unusual color.   Abdominal pain or discomfort.   You feel sick to your stomach (nauseous) or throw up (vomit).    You develop a fever of more than 102 F (38.9 C).  SEEK IMMEDIATE MEDICAL CARE IF:  You develop a fever of more than 102 F (38.9 C). Document Released: 03/24/2009 Document Revised: 05/16/2011 Document Reviewed: 03/24/2009 [facility] Patient Information 2012 [place].  Diet for Peritoneal Dialysis This diet may be modified in protein, sodium, phosphorus, potassium, or fluid, depending on your needs. The goals of nutrition therapy are similar to those for patients on hemodialysis. Providing enough protein to replace peritoneal losses is a priority. USES OF THIS DIET The diet is designed for the patient with end-stage kidney (renal) disease, who is treated by peritoneal dialysis. Treatment options include:  Continuous Ambulatory Peritoneal Dialysis (CAPD): Usually 4 exchanges of 1.5 to 2 liter volumes of glucose (sugar) and electrolyte-containing dialysate.   Continuous Cyclic Peritoneal Dialysis (CCPD): Essentially a reversal of CAPD, with shorter exchanges at night and a longer one during the day.   Intermittent Peritoneal Dialysis (IPD): 10 to  12 hours of exchanges, 2 to 3 times weekly.  ADEQUACY The diet may not meet the Recommended Dietary Allowances of the [employer] for calcium and ascorbic acid. Protein and water-soluble vitamin needs may be increased because of losses into the dialysate. Recommended daily supplements are the same as for hemodialysis patients. ASSESSMENT/DETERMINATION OF DIET Dietary needs will differ between patients. Parameters must be individualized. Protein  Guidelines: 1.2 to 1.3 gm/kg/day OR 1.5 gm/kg/day if patient is malnourished, catabolic, or has a protracted episode of peritonitis. A minimum of 50% of the protein intake should be of high biological value.   Goals: Meet protein requirements and replace dialysate losses while avoiding excessive accumulation of waste products. Achieve serum albumin greater than 3.5 g/dL.   Evaluate:  Current nutritional status, serum albumin and BUN levels, presence of peritonitis.  Sodium  Guidelines: Usually 90 to 175 mEq (2000 to 4000 mg), but should be individualized.   Goals: Minimize complications of fluid imbalance.   Evaluate: Weight, blood pressure regulation, and presence of swelling (edema).  Potassium  Guidelines: Individualized; often not restricted, and may need to be supplemented.   Goals: Serum K+ levels between 4.0 to 5.0 mEq/L.   Evaluate: Serum K+ levels, usual intake of K+, appetite.  Phosphorus  Guidelines: 800 to 1200 mg/day (the high protein intake results in a high obligatory P intake).   Goal: Serum P levels between 4.5 to 6.0 mg/dL.   Evaluate: Serum P levels, usual P intake, P-binding medications: type, number, dosage, distribution.  Fluids  Guidelines: Individualized - may not be restricted for all patients.   Goal: Minimize complications of fluid imbalance.   Evaluate: Weight, blood pressure regulation, sodium intake, and presence of edema.  Document Released: 05/27/2005 Document Revised: 05/16/2011 Document Reviewed: 08/19/2006 [facility] Patient Information 2012 [place].  Peritoneal Dialysis (CAPD) Catheter  Healthy kidneys remove excess water and waste products from the body in the form of urine. When the kidneys cannot do this, serious problems develop and a person can fall into kidney failure.   In most cases, the kidneys can heal and recover to a better place.  Sometimes the kidneys remain somewhat damaged and a kidney specialist (nephrologist) needs to help manage the disease with medications and adjustments in diet.    Sometimes the kidney failure has progressed too far.  The bodys waste and fluid build up in the blood. Hands and legs swell. You start to feel more weak or nauseated. Your blood pressure may rise, to seeing you at serious risk for heart attack and stroke.  If not treated, you will eventually die. Dialysis is a  treatment that replaces the work that your kidneys would do if they were healthy to help keep you alive.  Dialysis can be done using a machine outside of the body (hemodialysis) connected to your blood; or, it can be done with a tube placed to enter your abdomen (peritoneal dialysis). The peritoneum is the inner lining of your abdominal cavity, covering the inner organs & abdominal wall.  This inner lining of peritoneum works like a filter.   Peritoneal dialysis involves placed several quarts of sterile fluid into the inner abdomen/peritoneal cavity.  The waste products and fluids can exchange across the peritoneal membrane.  The fluid is later removed.  This most poor every day.  Sometimes the catheter can be attached to a machine that runs at night while you sleep (continuous cycler-assisted dialysis = CCPD).  Sometimes the fluid can be infused in her abdomen  and then later removed hours later (continuous ambulatory peritoneal dialysis = CAPD).  If your nephrologist feels like you are a good candidate, you will be sent to be evaluated by a surgeon to consider placement of a peritoneal dialysis catheter.  Your nephrologist should have shown you educational videos .  You should discuss with dialysis nurses about what life with a peritoneal dialysis catheter is like.  Most patients who have not had many abdominal operations are reasonable candidates for placement of a peritoneal dialysis catheter   It is important to understand the risks and benefits of the procedure prior to undergoing surgery.    If your surgeon feels you are a reasonable candidate, you will have a peritoneal dialysis catheter placed.    This requires an operation under general anesthesia.  The surgeon places a soft plastic tube ([place] curl catheter) into your abdomen.  The deeper curled tip rests down in the pelvis.  The middle part is tunneled inside your abdominal wall where cuffs provide a water-tight seal.  This leaves an outer foot  of plastic tubing resting outside your body, usually in your lower abdomen below the beltline near your waist.  This is a quick procedure that can often be done as an outpatient with the possibility of staying overnight.  It takes several weeks for the catheter to heal into a watertight seal.  Initially, your nephrologist we will have the dialysis nurses do gentle flushes through the catheter.  Once the catheter is well sealed in, you will begin larger exchanges of fluid to do full peritoneal dialysis.  Your nephrologist and dialysis nurses will help guide you through this.  While placement of the catheter is usually not technically difficult, there are risks to the procedure.  The biggest risk is infection.  This is why we place the catheter in the operating room with the use of intravenous antibiotics.  Using sterile technique to hook up the catheter to your dialysis fluids is essential.  Most infections of the catheter are mild and can be treated with antibiotics placed in the vein were placed with the peritoneal fluid.  However, sometimes the infection worsens or persists and the catheter needs to be removed.  Occasionally, the catheters can be blocked due to inner organs plugging up the tubing or kinks.  Sometimes the catheter leaks and needs to be replaced.  Sometimes the inner abdomen has too many adhesions from prior surgeries or infections and there is not enough space for fluid exchanges to occur.  Sometimes, peritoneal dialysis is not enough to compensate for the kidney failure.   In rare instances, the inner lining of the abdomen becomes severely thickened or inflamed and peritoneal dialysis can no longer work.  Sometimes it is not possible to safely replace a new catheter and the patient must go on hemodialysis permanently.  It is very common to need hemodialysis intermittently even if you have a peritoneal dialysis catheter in cases of emergencies or if peritoneal dialysis fails.  Your  nephrologist and surgeon can help you decide what the best form of dialysis is for you  LAPAROSCOPIC SURGERY: POST OP INSTRUCTIONS  8. DIET: Follow a light bland diet the first 24 hours after arrival home, such as soup, liquids, crackers, etc.  Be sure to include lots of fluids daily.  Avoid fast food or heavy meals as your are more likely to get nauseated.  Eat a low fat the next few days after surgery.   9. Take your usually prescribed  home medications unless otherwise directed. 10. PAIN CONTROL: a. Pain is best controlled by a usual combination of three different methods TOGETHER: i. Ice/Heat ii. Over the counter pain medication iii. Prescription pain medication b. Most patients will experience some swelling and bruising around the incisions.  Ice packs or heating pads (30-60 minutes up to 6 times a day) will help. Use ice for the first few days to help decrease swelling and bruising, then switch to heat to help relax tight/sore spots and speed recovery.  Some people prefer to use ice alone, heat alone, alternating between ice & heat.  Experiment to what works for you.  Swelling and bruising can take several weeks to resolve.   c. It is helpful to take an over-the-counter pain medication regularly for the first few weeks.  Choose one of the following that works best for you: i. Naproxen (Aleve, etc)  Two 220mg  tabs twice a day ii. Ibuprofen (Advil, etc) Three 200mg  tabs four times a day (every meal & bedtime) iii. Acetaminophen (Tylenol, etc) 500-650mg  four times a day (every meal & bedtime) d. A  prescription for pain medication (such as oxycodone, hydrocodone, etc) should be given to you upon discharge.  Take your pain medication as prescribed.  i. If you are having problems/concerns with the prescription medicine (does not control pain, nausea, vomiting, rash, itching, etc), please call us [phone] to see if we need to switch you to a different pain medicine that will work better for you  and/or control your side effect better. ii. If you need a refill on your pain medication, please contact your pharmacy.  They will contact our office to request authorization. Prescriptions will not be filled after 5 pm or on week-ends. 11. Avoid getting constipated.  Between the surgery and the pain medications, it is common to experience some constipation.  Increasing fluid intake and taking a fiber supplement (such as Metamucil, Citrucel, FiberCon, MiraLax, etc) 1-2 times a day regularly will usually help prevent this problem from occurring.  A mild laxative (prune juice, Milk of Magnesia, MiraLax, etc) should be taken according to package directions if there are no bowel movements after 48 hours.   12. Watch out for diarrhea.  If you have many loose bowel movements, simplify your diet to bland foods & liquids for a few days.  Stop any stool softeners and decrease your fiber supplement.  Switching to mild anti-diarrheal medications (Kayopectate, Pepto Bismol) can help.  If this worsens or does not improve, please call us. [place] / shower every day.  You may shower over the dressings as they are waterproof.  Continue to shower over incision(s) after the dressing is off. 14. Remove your waterproof bandages 5 days after surgery.  You may leave the incision open to air.  You may replace a dressing/Band-Aid to cover the incision for comfort if you wish.  15. ACTIVITIES as tolerated:   a. You may resume regular (light) daily activities beginning the next day--such as daily self-care, walking, climbing stairs--gradually increasing activities as tolerated.  If you can walk 30 minutes without difficulty, it is safe to try more intense activity such as jogging, treadmill, bicycling, low-impact aerobics, swimming, etc. b. Save the most intensive and strenuous activity for last such as sit-ups, heavy lifting, contact sports, etc  Refrain from any heavy lifting or straining until you are off narcotics for pain  control.   c. DO NOT PUSH THROUGH PAIN.  Let pain be your guide: If it hurts  to do something, don't do it.  Pain is your body warning you to avoid that activity for another week until the pain goes down. d. You may drive when you are no longer taking prescription pain medication, you can comfortably wear a seatbelt, and you can safely maneuver your car and apply brakes. e. Dennis Bast may have sexual intercourse when it is comfortable.  16. FOLLOW UP in our office a. Please call [facility] at [phone] to set up an appointment to see your surgeon in the office for a follow-up appointment approximately 2-3 weeks after your surgery. b. Make sure that you call for this appointment the day you arrive home to insure a convenient appointment time. 10. IF YOU HAVE DISABILITY OR FAMILY LEAVE FORMS, BRING THEM TO THE OFFICE FOR PROCESSING.  DO NOT GIVE THEM TO YOUR DOCTOR.   WHEN TO CALL us [phone]: 7. Poor pain control 8. Reactions / problems with new medications (rash/itching, nausea, etc)  9. Fever over 101.5 F (38.5 C) 10. Inability to urinate 11. Nausea and/or vomiting 12. Worsening swelling or bruising 13. Continued bleeding from incision. 14. Increased pain, redness, or drainage from the incision   The clinic staff is available to answer your questions during regular business hours (8:30am-5pm).  Please dont hesitate to call and ask to speak to one of our nurses for clinical concerns.   If you have a medical emergency, go to the nearest emergency room or call 911.  A surgeon from [facility] is always on call at the [facility], [place], [place], [place], [place]  [address] ? MAIN: [phone] ? TOLL FREE: [phone] ?  FAX [phone] [email or web address]  Managing Pain  Pain after surgery or related to activity is often due to strain/injury to muscle, tendon, nerves and/or incisions.  This pain is usually  short-term and will improve in a few months.   Many people find it helpful to do the following things TOGETHER to help speed the process of healing and to get back to regular activity more quickly:  1. Avoid heavy physical activity a.  no lifting greater than 20 pounds b. Do not push through the pain.  Listen to your body and avoid positions and maneuvers than reproduce the pain c. Walking is okay as tolerated, but go slowly and stop when getting sore.  d. Remember: If it hurts to do it, then dont do it! 2. Take Anti-inflammatory medication  a. Take with food/snack around the clock for 1-2 weeks i. This helps the muscle and nerve tissues become less irritable and calm down faster b. Choose ONE of the following over-the-counter medications: i. Naproxen 220mg  tabs (ex. Aleve) 1-2 pills twice a day  ii. Ibuprofen 200mg  tabs (ex. Advil, Motrin) 3-4 pills with every meal and just before bedtime iii. Acetaminophen 500mg  tabs (Tylenol) 1-2 pills with every meal and just before bedtime 3. Use a Heating pad or Ice/Cold Pack a. 4-6 times a day b. May use warm bath/hottub  or showers 4. Try Gentle Massage and/or Stretching  a. at the area of pain many times a day b. stop if you feel pain - do not overdo it  Try these steps together to help you body heal faster and avoid making things get worse.  Doing just one of these things may not be enough.    If you are not getting better after two weeks or are noticing you are getting worse, contact  our office for further advice; we may need to re-evaluate you & see what other things we can do to help.    What to eat:  For your first meals, you should eat lightly; only small meals initially.  If you do not have nausea, you may eat larger meals.  Avoid spicy, greasy and heavy food.    General Anesthesia, Adult, Care After  Refer to this sheet in the next few weeks. These instructions provide you with information on caring for yourself after your  procedure. Your health care provider may also give you more specific instructions. Your treatment has been planned according to current medical practices, but problems sometimes occur. Call your health care provider if you have any problems or questions after your procedure.  WHAT TO EXPECT AFTER THE PROCEDURE  After the procedure, it is typical to experience:  Sleepiness.  Nausea and vomiting. HOME CARE INSTRUCTIONS  For the first 24 hours after general anesthesia:  Have a responsible person with you.  Do not drive a car. If you are alone, do not take public transportation.  Do not drink alcohol.  Do not take medicine that has not been prescribed by your health care provider.  Do not sign important papers or make important decisions.  You may resume a normal diet and activities as directed by your health care provider.  Change bandages (dressings) as directed.  If you have questions or problems that seem related to general anesthesia, call the hospital and ask for the anesthetist or anesthesiologist on call. SEEK MEDICAL CARE IF:  You have nausea and vomiting that continue the day after anesthesia.  You develop a rash. SEEK IMMEDIATE MEDICAL CARE IF:  You have difficulty breathing.  You have chest pain.  You have any allergic problems. Document Released: 09/02/2000 Document Revised: 01/27/2013 Document Reviewed: 12/10/2012  [facility] Patient Information 2014 [place], [place].

## 2013-08-03 NOTE — Anesthesia Procedure Notes (Signed)
Procedure Name: Intubation Date/Time: 08/03/2013 7:32 AM Performed by: Ollen Bowl Pre-anesthesia Checklist: Patient identified, Timeout performed, Emergency Drugs available, Suction available and Patient being monitored Patient Re-evaluated:Patient Re-evaluated prior to inductionOxygen Delivery Method: Circle system utilized and Simple face mask Preoxygenation: Pre-oxygenation with 100% oxygen Intubation Type: IV induction Ventilation: Mask ventilation without difficulty Laryngoscope Size: Mac and 4 Grade View: Grade I Tube type: Oral Tube size: 8.0 mm Number of attempts: 1 Airway Equipment and Method: Patient positioned with wedge pillow and Stylet Placement Confirmation: ETT inserted through vocal cords under direct vision,  positive ETCO2 and breath sounds checked- equal and bilateral Secured at: 23 cm Tube secured with: Tape Dental Injury: Teeth and Oropharynx as per pre-operative assessment

## 2013-08-03 NOTE — Progress Notes (Signed)
Called Dr.Edwards for sign out  

## 2013-08-03 NOTE — Progress Notes (Signed)
Mario Woods US:[id number] 1961/01/05  CARE TEAM:  PCP: Robyn Haber, MD  Outpatient Care Team: Patient Care Team: Robyn Haber, MD as PCP - General (Family Medicine) Lucrezia Starch, MD as Consulting Physician (Nephrology) [name], MD as Consulting Physician (General Surgery)  Inpatient Treatment Team: Treatment Team: Attending Provider: Adin Hector, MD   Subjective:  Sitting up, smiling Using phone Wife, daughter & Ivin Booty RN at bedside Wife mildly concerned w facial swelling  Objective:  Vital signs:  Filed Vitals:   08/03/13 0945 08/03/13 0949 08/03/13 1045 08/03/13 1100  BP:  129/88 118/73 105/73  Pulse: 66 52 62 63  Temp:  97.5 F (36.4 C)    TempSrc:      Resp: 15 15    SpO2: 91% 96% 98% 96%       Intake/Output   Yesterday:    This shift:  Total I/O In: 925 [I.V.:925] Out: -   Bowel function:  Flatus: y  BM: n  Drain: clean  Physical Exam:  General: Pt awake/alert/oriented x4 in no acute distress Eyes: PERRL, normal EOM.  Sclera clear.  No icterus Neuro: CN II-XII intact w/o focal sensory/motor deficits. Lymph: No head/neck/groin lymphadenopathy Psych:  No delerium/psychosis/paranoia HENT: Normocephalic, Mucus membranes moist.  No thrush.  No hoarseness, stridor.  Talking normally.  Min edema on cheeks at the most Neck: Supple, No tracheal deviation Chest: No conversational dyspnea/wheezing/stridor.  Good excursion CV:  Pulses intact.  Regular rhythm MS: Normal AROM mjr joints.  No obvious deformity Abdomen:  No evidence of peritonitis.  N Ext:  SCDs BLE.  No mjr edema.  No cyanosis Skin: No petechiae / purpura   Problem List:   Principal Problem:   CKD (chronic kidney disease) stage V requiring chronic dialysis Active Problems:   Obesity (BMI 30-39.9)   Assessment  Mario Woods  53 y.o. male  Day of Surgery  Procedure(s): LAPAROSCOPIC EXPLORATION OF THE ABDOMEN WITH PLACEMENT OF PERITONEAL DIALYSIS  CATHETER  Stable  Plan:  OK for d/c  I updated the patient's status to the pt, RN, and fmily.  Recommendations were made.  Questions were answered.  They expressed understanding & appreciation.   -VTE prophylaxis- SCDs, etc -mobilize as tolerated to help recovery  Adin Hector, M.D., F.A.C.S. Gastrointestinal and Minimally Invasive Surgery [facility] [address], [place], [place] [address] [phone] Main / Paging   08/03/2013   Results:   Labs: Results for orders placed during the hospital encounter of 08/03/13 (from the past 48 hour(s))  POCT I-STAT 4, (NA,K, GLUC, HGB,HCT)     Status: Abnormal   Collection Time    08/03/13  6:17 AM      Result Value Ref Range   Sodium 142  137 - 147 mEq/L   Potassium 4.3  3.7 - 5.3 mEq/L   Glucose, Bld 93  70 - 99 mg/dL   HCT 36.0 (*) 39.0 - 52.0 %   Hemoglobin 12.2 (*) 13.0 - 17.0 g/dL    Imaging / Studies: No results found.  Medications / Allergies: per chart  Antibiotics: Anti-infectives   Start     Dose/Rate Route Frequency Ordered Stop   08/03/13 0600  ceFAZolin (ANCEF) 3 g in dextrose 5 % 50 mL IVPB     3 g 160 mL/hr over 30 Minutes Intravenous On call to O.R. 08/02/13 1427 08/03/13 0739   08/03/13 0600  gentamicin (GARAMYCIN) IVPB 100 mg  Status:  Discontinued  Comments:  Pharmacy may adjust dosing strength, schedule, rate of infusion, etc as needed to optimize therapy Send with patient on call to the OR.  Anesthesia to complete antibiotic administration <72min prior to incision per [facility].   100 mg 200 mL/hr over 30 Minutes Intravenous On call to O.R. 08/02/13 1427 08/02/13 1433   08/03/13 0600  gentamicin (GARAMYCIN) 200 mg in dextrose 5 % 50 mL IVPB    Comments:  Pharmacy may adjust dosing strength, schedule, rate of infusion, etc as needed to optimize therapy Send with patient on call to the OR.  Anesthesia to complete antibiotic administration <34min prior to  incision per [facility].   200 mg 110 mL/hr over 30 Minutes Intravenous On call to O.R. 08/02/13 1433 08/03/13 0818       Note: This dictation was prepared with Dragon/digital dictation along with Smartphrase technology. Any transcriptional errors that result from this process are unintentional.

## 2013-08-03 NOTE — Op Note (Addendum)
08/03/2013  8:39 AM  PATIENT:  Mario Woods  53 y.o. male  Patient Care Team: Robyn Haber, MD as PCP - General (Family Medicine) Lucrezia Starch, MD as Consulting Physician (Nephrology) [name], MD as Consulting Physician (General Surgery)  PRE-OPERATIVE DIAGNOSIS:  END STAGE RENAL DISEASE/DIALYSIS DEPENDENT; DESIRE FOR PERITONEAL DIALYSIS; PRIOR HERNIAS  POST-OPERATIVE DIAGNOSIS:  END STAGE RENAL DISEASE/DIALYSIS DEPENDENT  PROCEDURE:  Procedure(s): LAPAROSCOPIC EXPLORATION OF THE ABDOMEN INSERTION OF TUNNELED INTRAPERITONEAL CATHETER FOR DIALYSIS   SURGEON:  Surgeon(s): Adin Hector, MD  ASSISTANT: Ivin Booty RNFA   ANESTHESIA:   local and general  EBL:  Total I/O In: 500 [I.V.:500] Out: -   Delay start of Pharmacological VTE agent (>24hrs) due to surgical blood loss or risk of bleeding:  no  DRAINS: CAPD catheter.  (Medionics [id number] curl cath with titanium extender)    SPECIMEN:  No Specimen  DISPOSITION OF SPECIMEN:  N/A  COUNTS:  YES  PLAN OF CARE: Discharge to home after PACU  PATIENT DISPOSITION:  PACU - hemodynamically stable.  INDICATION: Pleasant obese male with kidney failure.  Had kidney transplant.  Failed.  Requiring dialysis again.  Required requests peritoneal dialysis catheter placement.  Prior ventral wall incisional hernia repair with mesh.  I recommended diagnostic laparoscopy to see if technically feasible:  The anatomy & physiology of peritoneum was discussed.  Natural history risks without surgery of worsening renal failure was discussed.   I feel the risks of no intervention will lead to serious problems that outweigh the operative risks; therefore, I recommended placement of a peritoneal dialysis catheter.  I explained laparoscopic techniques with possible need for an open approach.    Risks such as bleeding, infection, abscess, injury to other organs, catheter occlusion or malpositioning, reoperation to remove/reposition the  catheter, heart attack, death, and other risks were discussed.   I noted a good likelihood this will help address the problem.  Possibility that this will not be enough to compensate for the renal failure & need for further treatment such as hemodialysis was explained.  Goals of post-operative recovery were discussed as well.  We will work to minimize complications.   The patient is/will be getting training on catheter use by dialysis nursing before and after surgery.  I stressed the importance of meticulous care & sterile technique to prevent catheter problems.  Questions were answered.  The patient expresses understanding & wishes to proceed with surgery.   OR FINDINGS:   It is a long tunneled CAPD peritoneal dialysis catheter ([employer]ics [id number] curl cath with titanium extender) .  The curl tip of the catheter rests in the deep pelvis.   Entry into the peritoneum it is in the left suprapubic region just above the dome of the bladder.  Deepest cuff in the preperitoneal space at this location.  The exit site of the catheter on the skin is in the left upper abdomen subcostal region, 5 cm inferior to costal ridge, lateral clavicular line.  DESCRIPTION:   Informed consent was confirmed.  The patient underwent general anaesthesia without difficulty.  The patient was positioned appropriately.  VTE prevention in place.  The patient's abdomen was clipped, prepped, & draped in a sterile fashion.  Surgical timeout confirmed our plan.  The patient was positioned in reverse Trendelenburg.  Abdominal entry was gained using optical entry technique in the right upper abdomen.  Entry was clean.  I induced carbon dioxide insufflation.  Camera inspection revealed no injury.  Extra ports were carefully placed under  direct laparoscopic visualization.   Some moderate omental adhesions around the prior intraperitoneal ventral hernia mesh.  It functioned as a good omentopexy.  This kept the omentum from reaching into  the infraumbilical abdomen.  There is one small omental band to the left lower quadrant that excised and removed  I proceeded with placement of the peritoneal dialysis catheter.   I tunneled a 23mm dilating port obliquely through the abdominal wall from a left lower quadrant paramedian skin incision inferiorly, splitting the left infraumbililcal rectus muscle to exit into the peritoneum just cephalad to the dome of the bladder.  I placed the CAPD catheter through that port.  The curl of the CAPD catheter rested down in the cul de sac of the deep pelvis.   It flushed & aspirated 258mL heparinized saline well.  The deep cuff rested in the pre-peritoneal space.  The Port was removed to allow the external end to exit the skin of the port site.  I connected the upper double-cuff extra long catheter extention tubing using the [employer] titanium extender connector.    I tunneled the extended catheter using a long vascular tunneler with a blunt bullet-screw-on tip tunneler.  I tunnelled the catheter from the LLQ 59mm port exit site though the abdominal wall out the abdominal wall in the medial clavicular line 3 cm from the costal ridge .  The titanium connector was buried in the left perimedian periumbilical region of the abdominal wall with that tunneling done.  I left the proximal superficial cuff just under the skin inferior to that incision.  I used a Dispensing optician ([employer] [id number], 50 degree bend) spike-tip tunneler to tunnel the remaining tubing inferiorly to exit the skin at the left lateral clavicular line, 5 cm inferior to the costal ridge.  The most distal cuff rests superior to the final exit site in the SQ.     Hemostasis was excellent.  The catheter flushed and aspirated easily with heparinized saline into the CAPD catheter.  I removed the ports.    I closed the skin using 4-0 monocryl stitch.  Sterile dressings were applied. The patient was extubated & arrived in the PACU in stable condition..  I had  discussed postoperative care with the patient in the holding area. I am about to locate the patient's family and discuss operative findings and postoperative goals / instructions.  Instructions are written in the chart as well.  The patient will require close followup for flushing/management of a peritoneal dialysis catheter through [place]

## 2013-08-03 NOTE — Transfer of Care (Signed)
Immediate Anesthesia Transfer of Care Note  Patient: Mario Woods  Procedure(s) Performed: Procedure(s): LAPAROSCOPIC EXPLORATION OF THE ABDOMEN WITH PLACEMENT OF PERITONEAL DIALYSIS CATHETER (N/A)  Patient Location: PACU  Anesthesia Type:General  Level of Consciousness: awake, alert  and oriented  Airway & Oxygen Therapy: Patient Spontanous Breathing and Patient connected to nasal cannula oxygen  Post-op Assessment: Report given to PACU RN, Post -op Vital signs reviewed and stable and Patient moving all extremities X 4  Post vital signs: Reviewed and stable  Complications: No apparent anesthesia complications

## 2013-08-03 NOTE — Preoperative (Signed)
Beta Blockers   Reason not to administer Beta Blockers:Not Applicable 

## 2013-08-03 NOTE — H&P (Signed)
Mario Woods  Mar 12, 1961 [id number]  CARE TEAM:  PCP: Robyn Haber, MD  Outpatient Care Team: Patient Care Team: Robyn Haber, MD as PCP - General (Family Medicine) Lucrezia Starch, MD as Consulting Physician (Nephrology) [name], MD as Consulting Physician (General Surgery)  Inpatient Treatment Team: Treatment Team: Attending Provider: Adin Hector, MD  This patient is a 53 y.o.male who presents today for surgical evaluation at the request of Dr. Lorrene Reid.   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 Need for peritoneal dialysis.   Pleasant active male. Developed renal failure. Was able to get a kidney transplant at [facility]. Worked well for the first few years. Unfortunately, he has rejected it. Weaning off immunosuppressants. Started back up on hemodialysis q. Monday/Wednesday/Friday. Uses a left forearm AV fistula. Very little urine output. Does not like hemodialysis. Limits his activity. Limits his ability to work. Wished to consider peritoneal dialysis for more independence.   Can walk 2 or 3 miles without difficulty. Usually has 2-3 bowel movements a day. Required appendectomy for possible tumor. Turned out to be normal. Had umbilical hernias repaired primarily. It recurred. Repaired laparoscopically a year ago. Denies any new abdominal swelling/hernias. Has not smoked in over 4 years. Not had a colonoscopy. Just turned 52. It is on his "to do list"  No new events   Past Medical History  Diagnosis Date  . H/O kidney transplant     takes Cellcept and Prograf daily  . Gout due to renal impairment     history (04/28/2012)  . Hypertension     but doesn't take any meds  . Hyperlipidemia     borderline but no meds  . Sleep apnea     sleep study done 12+yrs ago but wasn't confirmed with having sleep apnea so no cpap  . Pneumonia     hx of;last time 2010  . History of bronchitis     unsure of how long ago  .  Tingling     fingers r/t meds he takes  . History of gout     but no meds required  . Diarrhea     med  . Kidney disease     M/W/F at [facility]  . Anemia   . History of blood transfusion     no abnormal reaction noted    Past Surgical History  Procedure Laterality Date  . Kidney transplant  04/2009  . Av fistula placement      Left  . Incisional hernia repair  04/27/2012    Procedure: LAPAROSCOPIC INCISIONAL HERNIA;  Surgeon: [name], MD;  Location: [place];  Service: General;  Laterality: N/A;  . Insertion of mesh  04/27/2012    Procedure: INSERTION OF MESH;  Surgeon: [name], MD;  Location: [place];  Service: General;  Laterality: N/A;  . Hernia repair  01/2009    primary ventral hernia repair x2   . Appendectomy  01/2009    History   Social History  . Marital Status: Married    Spouse Name: N/A    Number of Children: N/A  . Years of Education: N/A   Occupational History  . [employer]   Social History Main Topics  . Smoking status: Former Smoker -- 0.50 packs/day for 8 years    Types: Cigarettes  . Smokeless tobacco: Never Used     Comment: quit smoking about 50yrs ago  . Alcohol Use: Yes     Comment: minimal  .  Drug Use: No  . Sexual Activity: Yes   Other Topics Concern  . Not on file   Social History Narrative  . No narrative on file    Family History  Problem Relation Age of Onset  . Pancreatic cancer      Current Facility-Administered Medications  Medication Dose Route Frequency Provider Last Rate Last Dose  . ceFAZolin (ANCEF) 3 g in dextrose 5 % 50 mL IVPB  3 g Intravenous On Call to [place], MD      . gentamicin (GARAMYCIN) 200 mg in dextrose 5 % 50 mL IVPB  200 mg Intravenous On Call to [place], [facility]         Allergies  Allergen Reactions  . Tape Rash    Paper tape ok    ROS: Constitutional:  No fevers, chills, sweats.  Weight stable Eyes:  No vision changes, No discharge HENT:  No sore  throats, nasal drainage Lymph: No neck swelling, No bruising easily Pulmonary:  No cough, productive sputum CV: No orthopnea, PND  Patient walks 20 minutes for about 1/2 miles without difficulty.  No exertional chest/neck/shoulder/arm pain. GI: No personal nor family history of GI/colon cancer, inflammatory bowel disease, irritable bowel syndrome, allergy such as Celiac Sprue, dietary/dairy problems, colitis, ulcers nor gastritis.  No recent sick contacts/gastroenteritis.  No travel outside the country.  No changes in diet. Renal: No UTIs, No hematuria Genital:  No drainage, bleeding, masses Musculoskeletal: No severe joint pain.  Good ROM major joints Skin:  No sores or lesions.  No rashes Heme/Lymph:  No easy bleeding.  No swollen lymph nodes Neuro: No focal weakness/numbness.  No seizures Psych: No suicidal ideation.  No hallucinations  BP 134/80  Pulse 78  Temp(Src) 98.7 F (37.1 C) (Oral)  Resp 20  SpO2 97%  Physical Exam: General: Pt awake/alert/oriented x4 in no major acute distress Eyes: PERRL, normal EOM. Sclera nonicteric Neuro: CN II-XII intact w/o focal sensory/motor deficits. Lymph: No head/neck/groin lymphadenopathy Psych:  No delerium/psychosis/paranoia HENT: Normocephalic, Mucus membranes moist.  No thrush Neck: Supple, No tracheal deviation Chest: No pain.  Good respiratory excursion. CV:  Pulses intact.  Regular rhythm Abdomen: Soft, Nondistended.  Nontender.  No incarcerated hernias. Ext:  SCDs BLE.  No significant edema.  No cyanosis Skin: No petechiae / purpurea.  No major sores Musculoskeletal: No severe joint pain.  Good ROM major joints   Results:   Labs: Results for orders placed during the hospital encounter of 08/03/13 (from the past 48 hour(s))  POCT I-STAT 4, (NA,K, GLUC, HGB,HCT)     Status: Abnormal   Collection Time    08/03/13  6:17 AM      Result Value Ref Range   Sodium 142  137 - 147 mEq/L   Potassium 4.3  3.7 - 5.3 mEq/L   Glucose,  Bld 93  70 - 99 mg/dL   HCT 36.0 (*) 39.0 - 52.0 %   Hemoglobin 12.2 (*) 13.0 - 17.0 g/dL    Imaging / Studies: No results found.  Medications / Allergies: per chart  Antibiotics: Anti-infectives   Start     Dose/Rate Route Frequency Ordered Stop   08/03/13 0600  ceFAZolin (ANCEF) 3 g in dextrose 5 % 50 mL IVPB     3 g 160 mL/hr over 30 Minutes Intravenous On call to O.R. 08/02/13 1427 08/04/13 0559   08/03/13 0600  gentamicin (GARAMYCIN) IVPB 100 mg  Status:  Discontinued    Comments:  Pharmacy may adjust dosing strength, schedule, rate of infusion, etc as needed to optimize therapy Send with patient on call to the OR.  Anesthesia to complete antibiotic administration <54min prior to incision per [facility].   100 mg 200 mL/hr over 30 Minutes Intravenous On call to O.R. 08/02/13 1427 08/02/13 1433   08/03/13 0600  gentamicin (GARAMYCIN) 200 mg in dextrose 5 % 50 mL IVPB    Comments:  Pharmacy may adjust dosing strength, schedule, rate of infusion, etc as needed to optimize therapy Send with patient on call to the OR.  Anesthesia to complete antibiotic administration <53min prior to incision per [facility].   200 mg 110 mL/hr over 30 Minutes Intravenous On call to O.R. 08/02/13 1433 08/04/13 0559      Assessment  Waverly Ferrari  53 y.o. male  Day of Surgery  Procedure(s): LAPAROSCOPIC EXPLORATION OF THE ABDOMEN WITH PLACEMENT OF PERITONEAL DIALYSIS CATHETER LYSIS OF ADHESION, POSSIBLE HERNIA REPAIR  Problem List:  Active Problems:   * No active hospital problems. *   Recurrent renal failure status post failed kidney transplant on chronic dialysis. Desire for peritoneal dialysis.  Prior umbilical ventral hernias with recurrence s/p repairs & no strong evidence of recurrence now. No evidence of recurrent status post repair with mesh laparoscopically.   Plan:  I think the patient is a reasonable candidate for peritoneal dialysis. The biggest question will be if he has  too many adhesions in the abdomen to eliminate this. Reasonable to start with laparoscopic exploration. I feel no obvious hernias, but I would check for recurrence. I will have to guide placement of the catheter outside of where the central abdominal mesh is. He is interested in proceeding. I discussed with the pt & his wife/daughter:   The anatomy & physiology of peritoneum was discussed. Natural history risks without surgery of worsening renal failure was discussed. I feel the risks of no intervention will lead to serious problems that outweigh the operative risks; therefore, I recommended placement of a peritoneal dialysis catheter. I explained laparoscopic techniques with possible need for an open approach.  Risks such as bleeding, infection, abscess, injury to other organs, catheter occlusion or malpositioning, reoperation to remove/reposition the catheter, heart attack, death, and other risks were discussed. I noted a good likelihood this will help address the problem. Possibility that this will not be enough to compensate for the renal failure & need for further treatment such as hemodialysis was explained. Goals of post-operative recovery were discussed as well. We will work to minimize complications.  The patient is/will be getting training on catheter use by dialysis nursing before and after surgery. I stressed the importance of meticulous care & sterile technique to prevent catheter problems. Questions were answered. The patient expresses understanding & wishes to proceed with surgery.      -VTE prophylaxis- SCDs, etc -mobilize as tolerated to help recovery    Adin Hector, M.D., F.A.C.S. Gastrointestinal and Minimally Invasive Surgery [facility] [address], [place], [place] [address] [phone] Main / Paging   08/03/2013  Note: This dictation was prepared with Dragon/digital dictation along with [facility] technology. Any  transcriptional errors that result from this process are unintentional.

## 2013-08-09 ENCOUNTER — Encounter (HOSPITAL_COMMUNITY): Payer: Self-pay | Admitting: Surgery

## 2013-08-10 ENCOUNTER — Encounter (HOSPITAL_COMMUNITY): Payer: Self-pay | Admitting: Surgery

## 2013-09-01 ENCOUNTER — Ambulatory Visit (INDEPENDENT_AMBULATORY_CARE_PROVIDER_SITE_OTHER): Payer: Medicare Other | Admitting: Surgery

## 2013-09-01 ENCOUNTER — Encounter (INDEPENDENT_AMBULATORY_CARE_PROVIDER_SITE_OTHER): Payer: Self-pay | Admitting: Surgery

## 2013-09-01 ENCOUNTER — Encounter (INDEPENDENT_AMBULATORY_CARE_PROVIDER_SITE_OTHER): Payer: 59 | Admitting: Surgery

## 2013-09-01 VITALS — BP 122/82 | HR 78 | Resp 16 | Ht 77.0 in | Wt 273.0 lb

## 2013-09-01 DIAGNOSIS — Z992 Dependence on renal dialysis: Principal | ICD-10-CM

## 2013-09-01 DIAGNOSIS — N186 End stage renal disease: Secondary | ICD-10-CM

## 2013-09-01 DIAGNOSIS — T85691A Other mechanical complication of intraperitoneal dialysis catheter, initial encounter: Secondary | ICD-10-CM

## 2013-09-01 DIAGNOSIS — T85611A Breakdown (mechanical) of intraperitoneal dialysis catheter, initial encounter: Secondary | ICD-10-CM | POA: Insufficient documentation

## 2013-09-01 NOTE — Progress Notes (Signed)
Subjective:     Patient ID: Mario Woods, male   DOB: [birth year], 53 y.o.   MRN: [id number]  HPI  Note: This dictation was prepared with Dragon/digital dictation along with [employer]. Any transcriptional errors that result from this process are unintentional.       JOEY KRAUSKOPF  July 31, 1960 [id number]  Patient Care Team: Robyn Haber, MD as PCP - General (Family Medicine) Lucrezia Starch, MD as Consulting Physician (Nephrology) [name], MD as Consulting Physician (General Surgery)  Procedure (Date: 08/03/2013):  POST-OPERATIVE DIAGNOSIS: END STAGE RENAL DISEASE/DIALYSIS DEPENDENT   PROCEDURE: Procedure(s):  LAPAROSCOPIC EXPLORATION OF THE ABDOMEN  INSERTION OF TUNNELED INTRAPERITONEAL CATHETER FOR DIALYSIS   SURGEON: Surgeon(s):  Adin Hector, MD  OR FINDINGS:  It is a long tunneled CAPD peritoneal dialysis catheter ([employer] [phone] curl cath with titanium extender) . The curl tip of the catheter rests in the deep pelvis. Entry into the peritoneum it is in the left suprapubic region just above the dome of the bladder. Deepest cuff in the preperitoneal space at this location. The exit site of the catheter on the skin is in the left upper abdomen subcostal region, 5 cm inferior to costal ridge, lateral clavicular line.  This patient returns for surgical re-evaluation.  He is walking well.  Having regular bowel movements.  Apparently the peritoneal dialysis catheter had stopped flushing well.  They were able to clean it out and flush it out and did flush better.  However does not return well.  Peritoneal dialysis nurses wished him to be reevaluated.  He is eating fine.  No urinary pressure.  No fevers chills or sweats.  No hematuria no pyuria.  Cells making some urine.  Some mild pressure with bowel movements but not severe.  Some discomfort when he jogs but not severe.  Overall improving.  Patient Active Problem List   Diagnosis Date Noted  .  Peritoneal dialysis catheter dysfunction 09/01/2013  . Obesity (BMI 30-39.9) 06/29/2013  . CKD (chronic kidney disease) stage V requiring chronic dialysis 06/29/2013  . H/O primary IgA nephropathy 02/16/2013  . H/O kidney transplant 02/16/2013    Past Medical History  Diagnosis Date  . H/O kidney transplant     takes Cellcept and Prograf daily  . Gout due to renal impairment     history (04/28/2012)  . Hypertension     but doesn't take any meds  . Hyperlipidemia     borderline but no meds  . Sleep apnea     sleep study done 12+yrs ago but wasn't confirmed with having sleep apnea so no cpap  . Pneumonia     hx of;last time 2010  . History of bronchitis     unsure of how long ago  . Tingling     fingers r/t meds he takes  . History of gout     but no meds required  . Diarrhea     med  . Kidney disease     M/W/F at [facility]  . Anemia   . History of blood transfusion     no abnormal reaction noted    Past Surgical History  Procedure Laterality Date  . Kidney transplant  04/2009  . Av fistula placement      Left  . Incisional hernia repair  04/27/2012    Procedure: LAPAROSCOPIC INCISIONAL HERNIA;  Surgeon: [name], MD;  Location: [place];  Service: General;  Laterality: N/A;  . Insertion of mesh  04/27/2012  Procedure: INSERTION OF MESH;  Surgeon: [name], MD;  Location: [place];  Service: General;  Laterality: N/A;  . Hernia repair  01/2009    primary ventral hernia repair x2   . Appendectomy  01/2009  . Capd insertion N/A 08/03/2013    Procedure: LAPAROSCOPIC EXPLORATION OF THE ABDOMEN WITH PLACEMENT OF PERITONEAL DIALYSIS CATHETER;  Surgeon: Adin Hector, MD;  Location: [place];  Service: General;  Laterality: N/A;    History   Social History  . Marital Status: Married    Spouse Name: N/A    Number of Children: N/A  . Years of Education: N/A   Occupational History  . [employer]   Social History Main Topics  . Smoking status: Former  Smoker -- 0.50 packs/day for 8 years    Types: Cigarettes  . Smokeless tobacco: Never Used     Comment: quit smoking about 42yrs ago  . Alcohol Use: Yes     Comment: minimal  . Drug Use: No  . Sexual Activity: Yes   Other Topics Concern  . Not on file   Social History Narrative  . No narrative on file    Family History  Problem Relation Age of Onset  . Pancreatic cancer      Current Outpatient Prescriptions  Medication Sig Dispense Refill  . aspirin 81 MG chewable tablet Chew 81 mg by mouth every morning.      . Multiple Vitamin (MULTIVITAMIN WITH MINERALS) TABS tablet Take 1 tablet by mouth daily.      . mycophenolate (CELLCEPT) 250 MG capsule Take 500 mg by mouth 2 (two) times daily.       . predniSONE (DELTASONE) 10 MG tablet Take 10 mg by mouth daily with breakfast.      . tacrolimus (PROGRAF) 1 MG capsule Take 4 mg by mouth 2 (two) times daily.       . traMADol (ULTRAM) 50 MG tablet Take 1-2 tablets (50-100 mg total) by mouth every 6 (six) hours as needed for moderate pain or severe pain.  30 tablet  0   No current facility-administered medications for this visit.     Allergies  Allergen Reactions  . Tape Rash    Paper tape ok    There were no vitals taken for this visit.  No results found.   Review of Systems  Constitutional: Negative for fever, chills and diaphoresis.  HENT: Negative for sore throat and trouble swallowing.   Eyes: Negative for photophobia and visual disturbance.  Respiratory: Negative for choking and shortness of breath.   Cardiovascular: Negative for chest pain and palpitations.  Gastrointestinal: Negative for nausea, vomiting, abdominal distention, anal bleeding and rectal pain.  Genitourinary: Negative for dysuria, urgency, difficulty urinating and testicular pain.  Musculoskeletal: Negative for arthralgias, gait problem, myalgias and neck pain.  Skin: Negative for color change and rash.  Neurological: Negative for dizziness, speech  difficulty, weakness and numbness.  Hematological: Negative for adenopathy.  Psychiatric/Behavioral: Negative for hallucinations, confusion and agitation.       Objective:   Physical Exam  Constitutional: He is oriented to person, place, and time. He appears well-developed and well-nourished. No distress.  HENT:  Head: Normocephalic.  Mouth/Throat: Oropharynx is clear and moist. No oropharyngeal exudate.  Eyes: Conjunctivae and EOM are normal. Pupils are equal, round, and reactive to light. No scleral icterus.  Neck: Normal range of motion. No tracheal deviation present.  Cardiovascular: Normal rate, normal heart sounds and intact distal pulses.   Pulmonary/Chest:  Effort normal. No respiratory distress.  Abdominal: Soft. He exhibits no distension. There is no tenderness. Hernia confirmed negative in the right inguinal area and confirmed negative in the left inguinal area.    Incisions clean with normal healing ridges.  No hernias  Musculoskeletal: Normal range of motion. He exhibits no tenderness.  Neurological: He is alert and oriented to person, place, and time. No cranial nerve deficit. He exhibits normal muscle tone. Coordination normal.  Skin: Skin is warm and dry. No rash noted. He is not diaphoretic.  Psychiatric: He has a normal mood and affect. His behavior is normal.       Assessment:     Poorly functioning peritoneal dialysis catheter with good flushing but poor return.  Question of malpositioning vs adhesions     Plan:     I think it is reasonable to perform laparoscopic aspiration and see if the catheter needs to be repositioned, unclogged, or adhesions to its lysed and protected against.  He received I can get some Seprafilm to prevent adhesions to it.  I may need to replace it, but if it flushes well and is well positioned skeptical that that we will need to happen.  He wishes to try and see if it can be salvaged:  The anatomy & physiology of peritoneum was  discussed.  Natural history risks without surgery of worsening renal failure was discussed.   I feel the risks of no intervention will lead to serious problems that outweigh the operative risks; therefore, I recommended repositioning / replaceement of a peritoneal dialysis catheter.  I explained laparoscopic techniques with possible need for an open approach.  Probable omentopexy.  Risks such as bleeding, infection, abscess, injury to other organs, catheter occlusion or malpositioning, reoperation to remove/reposition the catheter, heart attack, death, and other risks were discussed.   I noted a good likelihood this will help address the problem.  Possibility that this will not be enough to compensate for the renal failure & need for further treatment such as hemodialysis was explained.  Goals of post-operative recovery were discussed as well.  We will work to minimize complications.   The patient is/will be getting training on catheter use by dialysis nursing before and after surgery.  I stressed the importance of meticulous care & sterile technique to prevent catheter problems.  Questions were answered.  The patient expresses understanding & wishes to proceed with surgery.

## 2013-09-01 NOTE — Patient Instructions (Signed)
Please consider the recommendations that we have given you today:  Consider surgery for laparoscopic exploration with possible repositioning, lysis of adhesions, vs. Replacement of current peritoneal dialysis catheter.  Hopefully we can salvage it.  Please call our office at [phone] if you wish to schedule surgery or if you have further questions / concerns.   Peritoneal Dialysis (CAPD) Catheter  Healthy kidneys remove excess water and waste products from the body in the form of urine. When the kidneys cannot do this, serious problems develop and a person can fall into kidney failure.   In most cases, the kidneys can heal and recover to a better place.  Sometimes the kidneys remain somewhat damaged and a kidney specialist (nephrologist) needs to help manage the disease with medications and adjustments in diet.    Sometimes the kidney failure has progressed too far.  The body's waste and fluid build up in the blood. Hands and legs swell. You start to feel more weak or nauseated. Your blood pressure may rise, to seeing you at serious risk for heart attack and stroke.  If not treated, you will eventually die. Dialysis is a treatment that replaces the work that your kidneys would do if they were healthy to help keep you alive.  Dialysis can be done using a machine outside of the body (hemodialysis) connected to your blood; or, it can be done with a tube placed to enter your abdomen (peritoneal dialysis). The peritoneum is the inner lining of your abdominal cavity, covering the inner organs & abdominal wall.  This inner lining of peritoneum works like a filter.   Peritoneal dialysis involves placed several quarts of sterile fluid into the inner abdomen/peritoneal cavity.  The waste products and fluids can exchange across the peritoneal membrane.  The fluid is later removed.  This most poor every day.  Sometimes the catheter can be attached to a machine that runs at night while you sleep (continuous  cycler-assisted dialysis = CCPD).  Sometimes the fluid can be infused in her abdomen and then later removed hours later (continuous ambulatory peritoneal dialysis = CAPD).  If your nephrologist feels like you are a good candidate, you will be sent to be evaluated by a surgeon to consider placement of a peritoneal dialysis catheter.  Your nephrologist should have shown you educational videos .  You should discuss with dialysis nurses about what life with a peritoneal dialysis catheter is like.  Most patients who have not had many abdominal operations are reasonable candidates for placement of a peritoneal dialysis catheter   It is important to understand the risks and benefits of the procedure prior to undergoing surgery.    If your surgeon feels you are a reasonable candidate, you will have a peritoneal dialysis catheter placed.    This requires an operation under general anesthesia.  The surgeon places a soft plastic tube ([place] curl catheter) into your abdomen.  The deeper curled tip rests down in the pelvis.  The middle part is tunneled inside your abdominal wall where cuffs provide a water-tight seal.  This leaves an outer foot of plastic tubing resting outside your body, usually in your lower abdomen below the beltline near your waist.  This is a quick procedure that can often be done as an outpatient with the possibility of staying overnight.  It takes several weeks for the catheter to heal into a watertight seal.  Initially, your nephrologist we will have the dialysis nurses do gentle flushes through the catheter.  Once  the catheter is well sealed in, you will begin larger exchanges of fluid to do full peritoneal dialysis.  Your nephrologist and dialysis nurses will help guide you through this.  While placement of the catheter is usually not technically difficult, there are risks to the procedure.  The biggest risk is infection.  This is why we place the catheter in the operating room with the use  of intravenous antibiotics.  Using sterile technique to hook up the catheter to your dialysis fluids is essential.  Most infections of the catheter are mild and can be treated with antibiotics placed in the vein were placed with the peritoneal fluid.  However, sometimes the infection worsens or persists and the catheter needs to be removed.  Occasionally, the catheters can be blocked due to inner organs plugging up the tubing or kinks.  Sometimes the catheter leaks and needs to be replaced.  Sometimes the inner abdomen has too many adhesions from prior surgeries or infections and there is not enough space for fluid exchanges to occur.  Sometimes, peritoneal dialysis is not enough to compensate for the kidney failure.   In rare instances, the inner lining of the abdomen becomes severely thickened or inflamed and peritoneal dialysis can no longer work.  Sometimes it is not possible to safely replace a new catheter and the patient must go on hemodialysis permanently.  It is very common to need hemodialysis intermittently even if you have a peritoneal dialysis catheter in cases of emergencies or if peritoneal dialysis fails.  Your nephrologist and surgeon can help you decide what the best form of dialysis is for you  PERITONEAL DIALYSIS (CAPD) CATHETER PLACEMENT:  POST OPERATIVE INSTRUCTIONS  1. DIET: Follow a light bland diet the first 24 hours after arrival home, such as soup, liquids, crackers, etc.  Be sure to include lots of fluids daily.  Avoid fast food or heavy meals as your are more likely to get nauseated.   2. Take your usually prescribed home medications unless otherwise directed. 3. PAIN CONTROL: a. Pain is best controlled by a usual combination of three different methods TOGETHER: i. Ice/Heat ii. Tylenol (over the counter pain medication) iii. Prescription pain medication b. Most patients will experience some swelling and bruising around the incisions.  Ice packs or heating pads (30-60  minutes up to 6 times a day) will help. Use ice for the first few days to help decrease swelling and bruising, then switch to heat to help relax tight/sore spots and speed recovery.  Some people prefer to use ice alone, heat alone, alternating between ice & heat.  Experiment to what works for you.  Swelling and bruising can take several weeks to resolve.   c. It is helpful to take an over-the-counter pain medication regularly for the first few weeks.  Using acetaminophen (Tylenol, etc) 500-650mg  four times a day (every meal & bedtime) is usually safest since NSAIDs are not advisable in patients with kidney disease. d. A  prescription for pain medication (such as oxycodone, hydrocodone, etc) should be given to you upon discharge.  Take your pain medication as prescribed.  i. If you are having problems/concerns with the prescription medicine (does not control pain, nausea, vomiting, rash, itching, etc), please call us [phone] to see if we need to switch you to a different pain medicine that will work better for you and/or control your side effect better. ii. If you need a refill on your pain medication, please contact your pharmacy.  They will contact  our office to request authorization. Prescriptions will not be filled after 5 pm or on week-ends. 4. Avoid getting constipated.  Between the surgery and the pain medications, it is common to experience some constipation.  Increasing fluid intake and taking a fiber supplement (such as Metamucil, Citrucel, FiberCon, MiraLax, etc) 1-2 times a day regularly will usually help prevent this problem from occurring.  A mild laxative (prune juice, Milk of Magnesia, MiraLax, etc) should be taken according to package directions if there are no bowel movements after 48 hours.   5. Wash / shower every day.  You may shower over the dressings as they are waterproof.  Continue to shower over incision(s) after the dressing is off. 6. The Peritoneal Dialysis nurse will remove  your waterproof bandages in the Dialysis Center a few days after surgery.  Do not remove the bandages until seen by them. 7. ACTIVITIES as tolerated:   a. You may resume regular (light) daily activities beginning the next day-such as daily self-care, walking, climbing stairs-gradually increasing activities as tolerated.  If you can walk 30 minutes without difficulty, it is safe to try more intense activity such as jogging, treadmill, bicycling, low-impact aerobics, swimming, etc. b. Save the most intensive and strenuous activity for last such as sit-ups, heavy lifting, contact sports, etc  Refrain from any heavy lifting or straining until you are off narcotics for pain control.   c. DO NOT PUSH THROUGH PAIN.  Let pain be your guide: If it hurts to do something, don't do it.  Pain is your body warning you to avoid that activity for another week until the pain goes down. d. You may drive when you are no longer taking prescription pain medication, you can comfortably wear a seatbelt, and you can safely maneuver your car and apply brakes. e. Dennis Bast may have sexual intercourse when it is comfortable.  FOLLOW UP with the Peritoneal Dialysis nurses closely after surgery.  Call [phone] or your neprologist to help arrange training/flushes of cathete    -The CAPD nurses & Nephrology usually follow you closely, making the need for follow-up in our office redundant and therefore not needed.  If they or you have concerns, please call us for possible follow-up in our office  -Please call [facility] at [phone] only as needed.  WHEN TO CALL us [phone]: 1. Poor pain control 2. Reactions / problems with new medications (rash/itching, nausea, etc)  3. Fever over 101.5 F (38.5 C) 4. Worsening swelling or bruising 5. Continued bleeding from incision. 6. Increased pain, redness, or drainage from the incision   The clinic staff is available to answer your questions during regular business hours (8:30am-5pm).   Please don't hesitate to call and ask to speak to one of our nurses for clinical concerns.   If you have a medical emergency, go to the nearest emergency room or call 911.  A surgeon from [facility] is always on call at the hospitals  9. IF YOU HAVE DISABILITY OR FAMILY LEAVE FORMS, BRING THEM TO THE OFFICE FOR PROCESSING.  DO NOT GIVE THEM TO YOUR DOCTOR.  [facility], [place], [place], [place], [place]  [id number] ? MAIN: [phone] ? TOLL FREE: [phone] ?  FAX [phone] [email or web address]  Peritoneal Dialysis - An Overview Dialysis can be done using a machine outside of the body (hemodialysis). Or, it can be done inside the body (peritoneal dialysis). The word "peritoneal" refers to the lining or membrane  of the belly (abdominal cavity). The peritoneal membrane is a thin, plastic-like lining inside the belly that covers the organs and fits in the abdominal or peritoneal cavity, such as the stomach, liver and the kidneys. This lining works like a filter. It will allow certain things to pass from your blood through the lining and into a special solution that has been placed into your belly. In this type of dialysis, the peritoneum is used to help clean the blood.  If you need dialysis, your kidneys are not working right. Healthy kidneys take out extra water and waste products, which becomes urine. When the kidneys do not do this, serious problems can develop. The waste and water build up in the blood. Your hands and feet might swell. You may feel tired, weak or sick to your stomach. Also, your blood pressure may rise. If not treated, you could die. Dialysis is a treatment that does the work that your kidneys would do if they were healthy.  It cleans your blood.   It will make sure your body has the right amount of certain chemicals that it needs. They include potassium, sodium and bicarbonate.   It will help control your blood  pressure.  UNDERSTANDING PERITONEAL DIALYSIS  Here is how peritoneal dialysis works:   First, you will have surgery to put a soft plastic tube (catheter) into your belly (abdomen). This will allow you to easily connect yourself to special tubing, which will then let a special dialysis solution to be placed into your abdomen.   For each treatment, you will need at least one bag of dialysis solution (a liquid called dialysate). It is a mix of water that is pure and free of germs (sterile), sugar (dextrose) and the nutrients and minerals found in your blood. Sometimes, more than one bag is needed to get the right amount of fluid for your abdomen. Your caregiver will explain what size and how many bags you will need.   The dialysate is slowly put through the catheter to fill the abdomen (called the peritoneal cavity). This dialysate will need to stay in your body for 3-4 hours. This is known as the dwell time.   The solution is working to clean the blood and remove wastes from your body. At the end of this time, the solution is drained from your body through tubing into an empty bag. It is then replaced with a fresh dialysate.   The draining and replacing of the dialysate is called an exchange or cycle. The catheter is capped after each exchange. Once the solution is in your body, you are then free to do whatever you would like until the next exchange. Most people will need to do 4-5 exchanges each day.   There are two different methods that can be used.   Continuous ambulatory peritoneal dialysis (CAPD): You put the solution into your abdomen, cap your catheter and then go about your day. Several hours later, you reconnect to a tubing set up, drain out the solution and then put more solution in. This is done several times a day. No machine is needed.   Continuous cycler-assisted peritoneal dialysis (CCPD): A machine is used, which fills the abdomen with dialysate and then drains it. This happens  several times. It usually is done at night while you are sleeping. When you wake up, you can disconnect from the machine and are free to go to go about your day.  PREPARING FOR EXCHANGES  Discuss the details of the  procedure with your caregivers. You will be working with a nurse who is specially trained in doing dialysis. Make sure you understand:   How to do an exchange.   How much solution you need.   What type of solution you will need.   How often you should do an exchange. Ask:   How many times each day?   When? At meals? At bedtime?   Always keep the dialysate bags and other supplies in a cool, clean and dry place.   Keeping everything clean is very important.   The catheter and its cap must be free from germs (sterile)   The adapter also must be sterile. It attaches the dialysis bag and tubing to the catheter.   Clean the area of your body around the catheter every day. Use a chemical that fights infection (antiseptic).   Wash your hands thoroughly before starting an exchange.   You may be taught to wear a mask to cover your nose and mouth. This makes infection less likely to happen.   You may be taught to close doors, windows and turn off any fans before doing an exchange.   Check the dialysate bag very carefully.   Make sure it is the right size bag for you. This information is on the label.   Also, make sure it is the right mixture. For some people, the dialysate contents vary. For instance, the mixture might be a stronger solution for overnight.   Check the expiration date (the last date you can use the bag). It also is on the label. If the date has gone by, throw away the bag.   The solution should be clear. You should be able to see any writing on the side of the bag clearly through the solution. Do not use a cloudy solution.   Gently squeeze the bag to make sure there are no leaks.   Use a dry heating pad to warm the dialysate in the bag. Leave the cover on  the bag while you do this.   This is for comfort. You can skip this step if you want.   Never place the bag of solution under warm or hot water. Water from a faucet is not sterile and could cause germs to get into the bag. Infection could then result.  PERFORMING AN EXCHANGE  For continuous ambulatory dialysis:   Attach the dialysis bag and tubing to your catheter. Hang the bag so that gravity (the natural downward pull) draws the solution down and into your abdomen once the clamps are opened. This should take about 10 minutes.   Remove the bag and tubing from the catheter. Cap the catheter.   The solution stays in the abdomen for 3-4 hours (dwell time). The solution is working to clean the blood and remove wastes from your body.   When you are ready to drain the solution for another exchange, take the cap off the catheter. Then, attach the catheter to tubing, which is attached to an empty bag. Place this empty bag below the abdomen or on the floor or stool and undo the clamps.   Gravity helps pull the fluid out of the abdomen and into the bag. The fluid in the bag may look yellow and clear, like urine. It usually takes about 20 minutes to drain the fluid out of the abdomen.   When the solution has drained, start the process again by infusing a new bag of dialysate and then capping the catheter.  This should continue until you have used all of the solution that you are to use each day.   Sometimes, a small machine is used overnight. It is called a mini-cycler. This is done if the body cannot go all night without an exchange. The machine lets you sleep without having to get up and do an exchange.   For continuous cycler-assisted dialysis:   You will be taught how to set up or program your machine.   When you are ready for bed, put the dialysate bags onto the cycler machine. Put on exactly the number of bags that your caregiver said to use.   Connect your catheter to the machine and  turn the cycler machine on.   Overnight, the cycler will do several exchanges. It often does three to five, sometimes more.   Solution that is in your abdomen in the morning will stay during the day. The machine is set to make the daytime solution stronger, if that is needed.   In the morning, you will disconnect from the machine and cap your catheter and go about your day.   Sometimes, an extra exchange is done during the day. This may be needed to remove excess waste or fluid.  IMPORTANT REMINDERS  You will need to follow a very strict schedule. Every step of the dialysis procedure must be done every day. Sometimes, several times a day. Altogether, this might take an extra 2 hours or more. However, you must stick to the routine. Do not skip a day. Do not skip a procedure.   Some people find it helpful to work with a Social worker or Education officer, museum in addition to the renal (kidney) nurse. They can help you figure out how to change your daily routine to fit in the dialysis sessions.   You may need to change your diet. Ask your caregiver for advice, or talk with a nutritionist about what you should and should not eat.   You will need to weigh yourself every day and keep track of what your weight is.   You may be taught how to check your blood pressure before every exchange. Your blood pressure reading will help determine what type of solution to use. If your blood pressure is too high, you may need a stronger solution.  RISKS AND COMPLICATIONS  Possible problems vary, depending on the method you use. Your overall health also can have an effect. Problems that could develop because of dialysis include:  Infection. This is the most common problem. It could occur:   In the peritoneum. This is called peritonitis.   Around the catheter.   Weight gain. The dialysate contains a type of sugar known as dextrose. Dextrose has a lot of calories. The body takes in several hundred calories from this sugar  each day.   Weakened muscles in the abdomen. This can result from all of the fluid that your body has to hold in the abdomen.   Catheter replacement. Sometimes, a new one has to be put in.   Change in dialysis method. Due to some complications, you may need to change to hemodialysis for a short time and have your dialysis done at a center.   Trouble adjusting to your new lifestyle. In some people, this leads to depression.   Sleep problems.   Dialysis-related amyloidosis. This sometimes occurs after 5 years of dialysis. Protein builds up in the blood. This can cause painful deposits on bones, joints and tendons (which connect muscle to bone). Or,  it can cause hollow spots in bones that make them more likely to break.   Excess fluid. Your body may absorb too much of the fluid that is held in the abdomen. This can lead to heart or lung problems.  SEEK MEDICAL CARE IF:   You have any problems with an exchange.   The area around the catheter becomes red or painful.   The catheter seems loose, or it feels like it is coming out.   A bag of dialysate looks cloudy. Or, the liquid is an unusual color.   Abdominal pain or discomfort.   You feel sick to your stomach (nauseous) or throw up (vomit).   You develop a fever of more than 102 F (38.9 C).  SEEK IMMEDIATE MEDICAL CARE IF:  You develop a fever of more than 102 F (38.9 C). Document Released: 03/24/2009 Document Revised: 05/16/2011 Document Reviewed: 03/24/2009 [facility] Patient Information 2012 [place].  Diet for Peritoneal Dialysis This diet may be modified in protein, sodium, phosphorus, potassium, or fluid, depending on your needs. The goals of nutrition therapy are similar to those for patients on hemodialysis. Providing enough protein to replace peritoneal losses is a priority. USES OF THIS DIET The diet is designed for the patient with end-stage kidney (renal) disease, who is treated by peritoneal dialysis. Treatment  options include:  Continuous Ambulatory Peritoneal Dialysis (CAPD): Usually 4 exchanges of 1.5 to 2 liter volumes of glucose (sugar) and electrolyte-containing dialysate.   Continuous Cyclic Peritoneal Dialysis (CCPD): Essentially a reversal of CAPD, with shorter exchanges at night and a longer one during the day.   Intermittent Peritoneal Dialysis (IPD): 10 to 12 hours of exchanges, 2 to 3 times weekly.  ADEQUACY The diet may not meet the Recommended Dietary Allowances of the [employer] for calcium and ascorbic acid. Protein and water-soluble vitamin needs may be increased because of losses into the dialysate. Recommended daily supplements are the same as for hemodialysis patients. ASSESSMENT/DETERMINATION OF DIET Dietary needs will differ between patients. Parameters must be individualized. Protein  Guidelines: 1.2 to 1.3 gm/kg/day OR 1.5 gm/kg/day if patient is malnourished, catabolic, or has a protracted episode of peritonitis. A minimum of 50% of the protein intake should be of high biological value.   Goals: Meet protein requirements and replace dialysate losses while avoiding excessive accumulation of waste products. Achieve serum albumin greater than 3.5 g/dL.   Evaluate: Current nutritional status, serum albumin and BUN levels, presence of peritonitis.  Sodium  Guidelines: Usually 90 to 175 mEq (2000 to 4000 mg), but should be individualized.   Goals: Minimize complications of fluid imbalance.   Evaluate: Weight, blood pressure regulation, and presence of swelling (edema).  Potassium  Guidelines: Individualized; often not restricted, and may need to be supplemented.   Goals: Serum K+ levels between 4.0 to 5.0 mEq/L.   Evaluate: Serum K+ levels, usual intake of K+, appetite.  Phosphorus  Guidelines: 800 to 1200 mg/day (the high protein intake results in a high obligatory P intake).   Goal: Serum P levels between 4.5 to 6.0 mg/dL.   Evaluate: Serum P  levels, usual P intake, P-binding medications: type, number, dosage, distribution.  Fluids  Guidelines: Individualized - may not be restricted for all patients.   Goal: Minimize complications of fluid imbalance.   Evaluate: Weight, blood pressure regulation, sodium intake, and presence of edema.  Document Released: 05/27/2005 Document Revised: 05/16/2011 Document Reviewed: 08/19/2006 [facility] Patient Information 2012 [place].

## 2013-09-03 ENCOUNTER — Encounter (HOSPITAL_COMMUNITY): Payer: Self-pay | Admitting: *Deleted

## 2013-09-03 ENCOUNTER — Encounter (HOSPITAL_COMMUNITY): Payer: Self-pay | Admitting: Pharmacy Technician

## 2013-09-03 NOTE — Progress Notes (Signed)
09/03/13 1112  OBSTRUCTIVE SLEEP APNEA  Have you ever been diagnosed with sleep apnea through a sleep study? No  Do you snore loudly (loud enough to be heard through closed doors)?  1  Do you often feel tired, fatigued, or sleepy during the daytime? 1  Has anyone observed you stop breathing during your sleep? 1  Do you have, or are you being treated for high blood pressure? 0  BMI more than 35 kg/m2? 0  Age over 51 years old? 1  Gender: 1  Obstructive Sleep Apnea Score 5

## 2013-09-05 MED ORDER — CHLORHEXIDINE GLUCONATE 4 % EX LIQD
1.0000 "application " | Freq: Once | CUTANEOUS | Status: DC
  Filled 2013-09-05: qty 15

## 2013-09-05 MED ORDER — GENTAMICIN IN SALINE 1-0.9 MG/ML-% IV SOLN
100.0000 mg | INTRAVENOUS | Status: AC
  Filled 2013-09-05: qty 100

## 2013-09-05 MED ORDER — DEXTROSE 5 % IV SOLN
3.0000 g | INTRAVENOUS | Status: AC
  Filled 2013-09-05: qty 3000

## 2013-09-06 ENCOUNTER — Ambulatory Visit (HOSPITAL_COMMUNITY): Payer: Medicare Other | Admitting: Certified Registered Nurse Anesthetist

## 2013-09-06 ENCOUNTER — Encounter (HOSPITAL_COMMUNITY): Payer: Self-pay | Admitting: Surgery

## 2013-09-06 ENCOUNTER — Ambulatory Visit (HOSPITAL_COMMUNITY)
Admission: RE | Admit: 2013-09-06 | Discharge: 2013-09-06 | Disposition: A | Discharge status: Home / Self Care | Payer: Medicare Other | Source: Ambulatory Visit | Attending: Surgery | Admitting: Surgery

## 2013-09-06 ENCOUNTER — Encounter (HOSPITAL_COMMUNITY)
Admission: RE | Disposition: A | Discharge status: Home / Self Care | Payer: Self-pay | Source: Ambulatory Visit | Attending: Surgery

## 2013-09-06 ENCOUNTER — Encounter (HOSPITAL_COMMUNITY): Payer: Medicare Other | Admitting: Certified Registered Nurse Anesthetist

## 2013-09-06 ENCOUNTER — Ambulatory Visit (HOSPITAL_COMMUNITY): Payer: Medicare Other

## 2013-09-06 DIAGNOSIS — Z87891 Personal history of nicotine dependence: Secondary | ICD-10-CM | POA: Insufficient documentation

## 2013-09-06 DIAGNOSIS — Y849 Medical procedure, unspecified as the cause of abnormal reaction of the patient, or of later complication, without mention of misadventure at the time of the procedure: Secondary | ICD-10-CM | POA: Insufficient documentation

## 2013-09-06 DIAGNOSIS — Z6830 Body mass index (BMI) 30.0-30.9, adult: Secondary | ICD-10-CM | POA: Insufficient documentation

## 2013-09-06 DIAGNOSIS — M109 Gout, unspecified: Secondary | ICD-10-CM | POA: Insufficient documentation

## 2013-09-06 DIAGNOSIS — Z940 Kidney transplant status: Secondary | ICD-10-CM | POA: Insufficient documentation

## 2013-09-06 DIAGNOSIS — N059 Unspecified nephritic syndrome with unspecified morphologic changes: Secondary | ICD-10-CM | POA: Insufficient documentation

## 2013-09-06 DIAGNOSIS — I120 Hypertensive chronic kidney disease with stage 5 chronic kidney disease or end stage renal disease: Secondary | ICD-10-CM | POA: Insufficient documentation

## 2013-09-06 DIAGNOSIS — E669 Obesity, unspecified: Secondary | ICD-10-CM | POA: Insufficient documentation

## 2013-09-06 DIAGNOSIS — E785 Hyperlipidemia, unspecified: Secondary | ICD-10-CM | POA: Insufficient documentation

## 2013-09-06 DIAGNOSIS — N186 End stage renal disease: Secondary | ICD-10-CM | POA: Diagnosis present

## 2013-09-06 DIAGNOSIS — T82898A Other specified complication of vascular prosthetic devices, implants and grafts, initial encounter: Secondary | ICD-10-CM | POA: Insufficient documentation

## 2013-09-06 DIAGNOSIS — T85611A Breakdown (mechanical) of intraperitoneal dialysis catheter, initial encounter: Secondary | ICD-10-CM | POA: Diagnosis present

## 2013-09-06 DIAGNOSIS — Z7982 Long term (current) use of aspirin: Secondary | ICD-10-CM | POA: Insufficient documentation

## 2013-09-06 DIAGNOSIS — Z992 Dependence on renal dialysis: Secondary | ICD-10-CM | POA: Insufficient documentation

## 2013-09-06 HISTORY — DX: Shortness of breath: R06.02

## 2013-09-06 HISTORY — PX: CAPD INSERTION: SHX5233

## 2013-09-06 LAB — POCT I-STAT 4, (NA,K, GLUC, HGB,HCT)
Glucose, Bld: 98 mg/dL (ref 70–99)
HCT: 29 % — ABNORMAL LOW (ref 39.0–52.0)
Hemoglobin: 9.9 g/dL — ABNORMAL LOW (ref 13.0–17.0)
POTASSIUM: 4.2 meq/L (ref 3.7–5.3)
Sodium: 140 mEq/L (ref 137–147)

## 2013-09-06 SURGERY — LAPAROSCOPIC INSERTION CONTINUOUS AMBULATORY PERITONEAL DIALYSIS  (CAPD) CATHETER
Anesthesia: General | Site: Abdomen

## 2013-09-06 MED ORDER — BUPIVACAINE-EPINEPHRINE (PF) 0.25% -1:200000 IJ SOLN
INTRAMUSCULAR | Status: AC
  Filled 2013-09-06: qty 30

## 2013-09-06 MED ORDER — SODIUM CHLORIDE 0.9 % IV SOLN
INTRAVENOUS | Status: DC | PRN
  Administered 2013-09-06: 07:00:00 via INTRAVENOUS

## 2013-09-06 MED ORDER — LIDOCAINE HCL (CARDIAC) 20 MG/ML IV SOLN
INTRAVENOUS | Status: DC | PRN
  Administered 2013-09-06: 30 mg via INTRAVENOUS

## 2013-09-06 MED ORDER — PROPOFOL 10 MG/ML IV BOLUS
INTRAVENOUS | Status: DC | PRN
  Administered 2013-09-06: 150 mg via INTRAVENOUS

## 2013-09-06 MED ORDER — ONDANSETRON HCL 4 MG/2ML IJ SOLN
INTRAMUSCULAR | Status: DC | PRN
  Administered 2013-09-06: 4 mg via INTRAVENOUS

## 2013-09-06 MED ORDER — PHENYLEPHRINE HCL 10 MG/ML IJ SOLN
INTRAMUSCULAR | Status: DC | PRN
  Administered 2013-09-06: 80 ug via INTRAVENOUS

## 2013-09-06 MED ORDER — OXYCODONE HCL 5 MG PO TABS: 5.0000 mg | ORAL_TABLET | Freq: Four times a day (QID) | ORAL | Status: DC | PRN

## 2013-09-06 MED ORDER — FENTANYL CITRATE 0.05 MG/ML IJ SOLN
INTRAMUSCULAR | Status: AC
  Filled 2013-09-06: qty 5

## 2013-09-06 MED ORDER — BUPIVACAINE-EPINEPHRINE 0.25% -1:200000 IJ SOLN
INTRAMUSCULAR | Status: DC | PRN
  Administered 2013-09-06: 30 mL

## 2013-09-06 MED ORDER — ROCURONIUM BROMIDE 50 MG/5ML IV SOLN
INTRAVENOUS | Status: AC
  Filled 2013-09-06: qty 1

## 2013-09-06 MED ORDER — SUCCINYLCHOLINE CHLORIDE 20 MG/ML IJ SOLN
INTRAMUSCULAR | Status: DC | PRN
  Administered 2013-09-06: 100 mg via INTRAVENOUS

## 2013-09-06 MED ORDER — ROCURONIUM BROMIDE 100 MG/10ML IV SOLN
INTRAVENOUS | Status: DC | PRN
  Administered 2013-09-06: 40 mg via INTRAVENOUS

## 2013-09-06 MED ORDER — HEPARIN SODIUM (PORCINE) 5000 UNIT/ML IJ SOLN
INTRAMUSCULAR | Status: DC | PRN
  Administered 2013-09-06: 07:00:00

## 2013-09-06 MED ORDER — 0.9 % SODIUM CHLORIDE (POUR BTL) OPTIME
TOPICAL | Status: DC | PRN
  Administered 2013-09-06: 1000 mL

## 2013-09-06 MED ORDER — CHLORHEXIDINE GLUCONATE 4 % EX LIQD
1.0000 "application " | Freq: Once | CUTANEOUS | Status: DC
  Filled 2013-09-06: qty 15

## 2013-09-06 MED ORDER — PHENYLEPHRINE 40 MCG/ML (10ML) SYRINGE FOR IV PUSH (FOR BLOOD PRESSURE SUPPORT)
PREFILLED_SYRINGE | INTRAVENOUS | Status: AC
  Filled 2013-09-06: qty 10

## 2013-09-06 MED ORDER — FENTANYL CITRATE 0.05 MG/ML IJ SOLN: 25.0000 ug | INTRAMUSCULAR | Status: DC | PRN

## 2013-09-06 MED ORDER — GENTAMICIN IN SALINE 1.6-0.9 MG/ML-% IV SOLN
INTRAVENOUS | Status: DC | PRN
  Administered 2013-09-06: 100 mg via INTRAVENOUS

## 2013-09-06 MED ORDER — MIDAZOLAM HCL 5 MG/5ML IJ SOLN
INTRAMUSCULAR | Status: DC | PRN
  Administered 2013-09-06: 2 mg via INTRAVENOUS

## 2013-09-06 MED ORDER — PROMETHAZINE HCL 25 MG/ML IJ SOLN: 6.2500 mg | INTRAMUSCULAR | Status: DC | PRN

## 2013-09-06 MED ORDER — NEOSTIGMINE METHYLSULFATE 1 MG/ML IJ SOLN
INTRAMUSCULAR | Status: DC | PRN
  Administered 2013-09-06: 4 mg via INTRAVENOUS

## 2013-09-06 MED ORDER — OXYCODONE HCL 5 MG PO TABS: 5.0000 mg | ORAL_TABLET | Freq: Once | ORAL | Status: DC | PRN

## 2013-09-06 MED ORDER — OXYCODONE HCL 5 MG/5ML PO SOLN: 5.0000 mg | Freq: Once | ORAL | Status: DC | PRN

## 2013-09-06 MED ORDER — LIDOCAINE HCL (CARDIAC) 20 MG/ML IV SOLN
INTRAVENOUS | Status: AC
  Filled 2013-09-06: qty 5

## 2013-09-06 MED ORDER — PROPOFOL 10 MG/ML IV BOLUS
INTRAVENOUS | Status: AC
  Filled 2013-09-06: qty 20

## 2013-09-06 MED ORDER — GLYCOPYRROLATE 0.2 MG/ML IJ SOLN
INTRAMUSCULAR | Status: DC | PRN
  Administered 2013-09-06: 0.6 mg via INTRAVENOUS

## 2013-09-06 MED ORDER — CEFAZOLIN SODIUM 10 G IJ SOLR
3.0000 g | INTRAMUSCULAR | Status: DC | PRN
  Administered 2013-09-06: 3 g via INTRAVENOUS

## 2013-09-06 MED ORDER — MEPERIDINE HCL 25 MG/ML IJ SOLN: 6.2500 mg | INTRAMUSCULAR | Status: DC | PRN

## 2013-09-06 MED ORDER — FENTANYL CITRATE 0.05 MG/ML IJ SOLN
INTRAMUSCULAR | Status: DC | PRN
  Administered 2013-09-06: 100 ug via INTRAVENOUS
  Administered 2013-09-06: 50 ug via INTRAVENOUS

## 2013-09-06 MED ORDER — MIDAZOLAM HCL 2 MG/2ML IJ SOLN
INTRAMUSCULAR | Status: AC
  Filled 2013-09-06: qty 2

## 2013-09-06 SURGICAL SUPPLY — 58 items
ADAPTER CATH SAFE LCK II CO PK (MISCELLANEOUS) ×1 IMPLANT
ADAPTER SAFE LOCK II CO PACK (MISCELLANEOUS) ×2
ADAPTER TITANIUM MEDIONICS (MISCELLANEOUS) ×6 IMPLANT
BAG DECANTER FOR FLEXI CONT (MISCELLANEOUS) ×6 IMPLANT
BLADE SURG ROTATE 9660 (MISCELLANEOUS) ×3 IMPLANT
CANISTER SUCTION 2500CC (MISCELLANEOUS) IMPLANT
CATH EXTENDED DIALYSIS (CATHETERS) ×3 IMPLANT
CATH MONCRIEF POPOVICH (CATHETERS) IMPLANT
CHLORAPREP W/TINT 26ML (MISCELLANEOUS) ×3 IMPLANT
COIL SWAN NECK LT (MISCELLANEOUS) IMPLANT
COIL SWAN NECK RT (MISCELLANEOUS) IMPLANT
COVER SURGICAL LIGHT HANDLE (MISCELLANEOUS) ×3 IMPLANT
DECANTER SPIKE VIAL GLASS SM (MISCELLANEOUS) ×3 IMPLANT
DISSECTOR BLUNT TIP ENDO 5MM (MISCELLANEOUS) IMPLANT
DRAPE PROXIMA HALF (DRAPES) ×3 IMPLANT
DRAPE WARM FLUID 44X44 (DRAPE) ×3 IMPLANT
DRSG OPSITE 4X5.5 SM (GAUZE/BANDAGES/DRESSINGS) IMPLANT
DRSG PAD ABDOMINAL 8X10 ST (GAUZE/BANDAGES/DRESSINGS) IMPLANT
DRSG TEGADERM 2-3/8X2-3/4 SM (GAUZE/BANDAGES/DRESSINGS) ×3 IMPLANT
DRSG TEGADERM 4X4.75 (GAUZE/BANDAGES/DRESSINGS) ×3 IMPLANT
ELECT REM PT RETURN 9FT ADLT (ELECTROSURGICAL) ×3
ELECTRODE REM PT RTRN 9FT ADLT (ELECTROSURGICAL) ×1 IMPLANT
GAUZE SPONGE 2X2 8PLY STRL LF (GAUZE/BANDAGES/DRESSINGS) ×1 IMPLANT
GLOVE BIO SURGEON STRL SZ7.5 (GLOVE) ×3 IMPLANT
GLOVE BIOGEL PI IND STRL 7.0 (GLOVE) ×2 IMPLANT
GLOVE BIOGEL PI IND STRL 7.5 (GLOVE) ×1 IMPLANT
GLOVE BIOGEL PI IND STRL 8 (GLOVE) ×1 IMPLANT
GLOVE BIOGEL PI INDICATOR 7.0 (GLOVE) ×4
GLOVE BIOGEL PI INDICATOR 7.5 (GLOVE) ×2
GLOVE BIOGEL PI INDICATOR 8 (GLOVE) ×2
GLOVE ECLIPSE 8.0 STRL XLNG CF (GLOVE) ×3 IMPLANT
GLOVE SURG SS PI 7.0 STRL IVOR (GLOVE) ×3 IMPLANT
GOWN STRL REUS W/ TWL LRG LVL3 (GOWN DISPOSABLE) ×2 IMPLANT
GOWN STRL REUS W/ TWL XL LVL3 (GOWN DISPOSABLE) ×1 IMPLANT
GOWN STRL REUS W/TWL LRG LVL3 (GOWN DISPOSABLE) ×4
GOWN STRL REUS W/TWL XL LVL3 (GOWN DISPOSABLE) ×2
KIT BASIN OR (CUSTOM PROCEDURE TRAY) ×3 IMPLANT
KIT ROOM TURNOVER OR (KITS) ×3 IMPLANT
NEEDLE 22X1 1/2 (OR ONLY) (NEEDLE) ×3 IMPLANT
NS IRRIG 1000ML POUR BTL (IV SOLUTION) ×3 IMPLANT
PAD ARMBOARD 7.5X6 YLW CONV (MISCELLANEOUS) ×6 IMPLANT
PERITONEAL DIALYSIS CATHETER ×3 IMPLANT
SET EXTENSION TUBING 8  CATH (SET/KITS/TRAYS/PACK) ×3 IMPLANT
SET IRRIG TUBING LAPAROSCOPIC (IRRIGATION / IRRIGATOR) IMPLANT
SLEEVE ENDOPATH XCEL 5M (ENDOMECHANICALS) ×6 IMPLANT
SPONGE GAUZE 2X2 STER 10/PKG (GAUZE/BANDAGES/DRESSINGS) ×2
STYLET FALLER MEDIONICS (MISCELLANEOUS) ×6 IMPLANT
SUT MNCRL AB 4-0 PS2 18 (SUTURE) ×3 IMPLANT
SUT PROLENE 2 0 CT2 30 (SUTURE) ×9 IMPLANT
SUT SILK 2 0 SH (SUTURE) ×3 IMPLANT
SYR 50ML SLIP (SYRINGE) ×6 IMPLANT
TOWEL OR 17X24 6PK STRL BLUE (TOWEL DISPOSABLE) ×3 IMPLANT
TOWEL OR 17X26 10 PK STRL BLUE (TOWEL DISPOSABLE) ×3 IMPLANT
TRAY LAPAROSCOPIC (CUSTOM PROCEDURE TRAY) ×3 IMPLANT
TROCAR FALLER TUNNELING (TROCAR) ×3 IMPLANT
TROCAR XCEL NON BLADE 8MM B8LT (ENDOMECHANICALS) ×3 IMPLANT
TROCAR XCEL NON-BLD 11X100MML (ENDOMECHANICALS) ×3 IMPLANT
TROCAR XCEL NON-BLD 5MMX100MML (ENDOMECHANICALS) ×6 IMPLANT

## 2013-09-06 NOTE — Anesthesia Postprocedure Evaluation (Signed)
  Anesthesia Post-op Note  Patient: Mario Woods  Procedure(s) Performed: Procedure(s): DIAGNOSTIC LAPAROSCOPY WITH  REPOSITIONING OF CONTINUOUS AMBULATORY PERITONEAL DIALYSIS  (CAPD) CATHETER  (N/A)  Patient Location: PACU  Anesthesia Type:General  Level of Consciousness: awake, alert , oriented and patient cooperative  Airway and Oxygen Therapy: Patient Spontanous Breathing  Post-op Pain: none  Post-op Assessment: Post-op Vital signs reviewed, Patient's Cardiovascular Status Stable, Respiratory Function Stable, Patent Airway, No signs of Nausea or vomiting and Pain level controlled  Post-op Vital Signs: Reviewed and stable  Complications: No apparent anesthesia complications

## 2013-09-06 NOTE — Discharge Instructions (Signed)
Peritoneal Dialysis (CAPD) Catheter  Healthy kidneys remove excess water and waste products from the body in the form of urine. When the kidneys cannot do this, serious problems develop and a person can fall into kidney failure.   In most cases, the kidneys can heal and recover to a better place.  Sometimes the kidneys remain somewhat damaged and a kidney specialist (nephrologist) needs to help manage the disease with medications and adjustments in diet.    Sometimes the kidney failure has progressed too far.  The bodys waste and fluid build up in the blood. Hands and legs swell. You start to feel more weak or nauseated. Your blood pressure may rise, to seeing you at serious risk for heart attack and stroke.  If not treated, you will eventually die. Dialysis is a treatment that replaces the work that your kidneys would do if they were healthy to help keep you alive.  Dialysis can be done using a machine outside of the body (hemodialysis) connected to your blood; or, it can be done with a tube placed to enter your abdomen (peritoneal dialysis). The peritoneum is the inner lining of your abdominal cavity, covering the inner organs & abdominal wall.  This inner lining of peritoneum works like a filter.   Peritoneal dialysis involves placed several quarts of sterile fluid into the inner abdomen/peritoneal cavity.  The waste products and fluids can exchange across the peritoneal membrane.  The fluid is later removed.  This most poor every day.  Sometimes the catheter can be attached to a machine that runs at night while you sleep (continuous cycler-assisted dialysis = CCPD).  Sometimes the fluid can be infused in her abdomen and then later removed hours later (continuous ambulatory peritoneal dialysis = CAPD).  If your nephrologist feels like you are a good candidate, you will be sent to be evaluated by a surgeon to consider placement of a peritoneal dialysis catheter.  Your nephrologist should have shown you  educational videos .  You should discuss with dialysis nurses about what life with a peritoneal dialysis catheter is like.  Most patients who have not had many abdominal operations are reasonable candidates for placement of a peritoneal dialysis catheter   It is important to understand the risks and benefits of the procedure prior to undergoing surgery.    If your surgeon feels you are a reasonable candidate, you will have a peritoneal dialysis catheter placed.    This requires an operation under general anesthesia.  The surgeon places a soft plastic tube ([place] curl catheter) into your abdomen.  The deeper curled tip rests down in the pelvis.  The middle part is tunneled inside your abdominal wall where cuffs provide a water-tight seal.  This leaves an outer foot of plastic tubing resting outside your body, usually in your lower abdomen below the beltline near your waist.  This is a quick procedure that can often be done as an outpatient with the possibility of staying overnight.  It takes several weeks for the catheter to heal into a watertight seal.  Initially, your nephrologist we will have the dialysis nurses do gentle flushes through the catheter.  Once the catheter is well sealed in, you will begin larger exchanges of fluid to do full peritoneal dialysis.  Your nephrologist and dialysis nurses will help guide you through this.  While placement of the catheter is usually not technically difficult, there are risks to the procedure.  The biggest risk is infection.  This is why we place  the catheter in the operating room with the use of intravenous antibiotics.  Using sterile technique to hook up the catheter to your dialysis fluids is essential.  Most infections of the catheter are mild and can be treated with antibiotics placed in the vein were placed with the peritoneal fluid.  However, sometimes the infection worsens or persists and the catheter needs to be removed.  Occasionally, the catheters can  be blocked due to inner organs plugging up the tubing or kinks.  Sometimes the catheter leaks and needs to be replaced.  Sometimes the inner abdomen has too many adhesions from prior surgeries or infections and there is not enough space for fluid exchanges to occur.  Sometimes, peritoneal dialysis is not enough to compensate for the kidney failure.   In rare instances, the inner lining of the abdomen becomes severely thickened or inflamed and peritoneal dialysis can no longer work.  Sometimes it is not possible to safely replace a new catheter and the patient must go on hemodialysis permanently.  It is very common to need hemodialysis intermittently even if you have a peritoneal dialysis catheter in cases of emergencies or if peritoneal dialysis fails.  Your nephrologist and surgeon can help you decide what the best form of dialysis is for you  PERITONEAL DIALYSIS (CAPD) CATHETER PLACEMENT:  POST OPERATIVE INSTRUCTIONS  1. DIET: Follow a light bland diet the first 24 hours after arrival home, such as soup, liquids, crackers, etc.  Be sure to include lots of fluids daily.  Avoid fast food or heavy meals as your are more likely to get nauseated.   2. Take your usually prescribed home medications unless otherwise directed. 3. PAIN CONTROL: a. Pain is best controlled by a usual combination of three different methods TOGETHER: i. Ice/Heat ii. Tylenol (over the counter pain medication) iii. Prescription pain medication b. Most patients will experience some swelling and bruising around the incisions.  Ice packs or heating pads (30-60 minutes up to 6 times a day) will help. Use ice for the first few days to help decrease swelling and bruising, then switch to heat to help relax tight/sore spots and speed recovery.  Some people prefer to use ice alone, heat alone, alternating between ice & heat.  Experiment to what works for you.  Swelling and bruising can take several weeks to resolve.   c. It is helpful to  take an over-the-counter pain medication regularly for the first few weeks.  Using acetaminophen (Tylenol, etc) 500-650mg  four times a day (every meal & bedtime) is usually safest since NSAIDs are not advisable in patients with kidney disease. d. A  prescription for pain medication (such as oxycodone, hydrocodone, etc) should be given to you upon discharge.  Take your pain medication as prescribed.  i. If you are having problems/concerns with the prescription medicine (does not control pain, nausea, vomiting, rash, itching, etc), please call us [phone] to see if we need to switch you to a different pain medicine that will work better for you and/or control your side effect better. ii. If you need a refill on your pain medication, please contact your pharmacy.  They will contact our office to request authorization. Prescriptions will not be filled after 5 pm or on week-ends. 4. Avoid getting constipated.  Between the surgery and the pain medications, it is common to experience some constipation.  Increasing fluid intake and taking a fiber supplement (such as Metamucil, Citrucel, FiberCon, MiraLax, etc) 1-2 times a day regularly will usually help  prevent this problem from occurring.  A mild laxative (prune juice, Milk of Magnesia, MiraLax, etc) should be taken according to package directions if there are no bowel movements after 48 hours.   5. Wash / shower every day.  You may shower over the dressings as they are waterproof.  Continue to shower over incision(s) after the dressing is off. 6. The Peritoneal Dialysis nurse will remove your waterproof bandages in the Dialysis Center a few days after surgery.  Do not remove the bandages until seen by them. 7. ACTIVITIES as tolerated:   a. You may resume regular (light) daily activities beginning the next day--such as daily self-care, walking, climbing stairs--gradually increasing activities as tolerated.  If you can walk 30 minutes without difficulty, it is  safe to try more intense activity such as jogging, treadmill, bicycling, low-impact aerobics, swimming, etc. b. Save the most intensive and strenuous activity for last such as sit-ups, heavy lifting, contact sports, etc  Refrain from any heavy lifting or straining until you are off narcotics for pain control.   c. DO NOT PUSH THROUGH PAIN.  Let pain be your guide: If it hurts to do something, don't do it.  Pain is your body warning you to avoid that activity for another week until the pain goes down. d. You may drive when you are no longer taking prescription pain medication, you can comfortably wear a seatbelt, and you can safely maneuver your car and apply brakes. e. Dennis Bast may have sexual intercourse when it is comfortable.  FOLLOW UP with the Peritoneal Dialysis nurses closely after surgery.  Call [phone] or your nephrologist to help arrange training/flushes of your CAPD catheter    -The CAPD nurses & Nephrology usually follow you closely, making the need for follow-up in our office redundant and therefore not needed.  If they or you have concerns, please call us for possible follow-up in our office  -Please call [facility] at [phone] only as needed.  WHEN TO CALL us [phone]: 1. Poor pain control 2. Reactions / problems with new medications (rash/itching, nausea, etc)  3. Fever over 101.5 F (38.5 C) 4. Worsening swelling or bruising 5. Continued bleeding from incision. 6. Increased pain, redness, or drainage from the incision   The clinic staff is available to answer your questions during regular business hours (8:30am-5pm).  Please dont hesitate to call and ask to speak to one of our nurses for clinical concerns.   If you have a medical emergency, go to the nearest emergency room or call 911.  A surgeon from [facility] is always on call at the hospitals  9. IF YOU HAVE DISABILITY OR FAMILY LEAVE FORMS, BRING THEM TO THE OFFICE FOR PROCESSING.  DO NOT GIVE THEM TO  YOUR DOCTOR.  [facility], [place], [place], [place], [place]  [address] ? MAIN: [phone] ? TOLL FREE: [phone] ?  FAX [phone] [email or web address]  Peritoneal Dialysis - An Overview Dialysis can be done using a machine outside of the body (hemodialysis). Or, it can be done inside the body (peritoneal dialysis). The word "peritoneal" refers to the lining or membrane of the belly (abdominal cavity). The peritoneal membrane is a thin, plastic-like lining inside the belly that covers the organs and fits in the abdominal or peritoneal cavity, such as the stomach, liver and the kidneys. This lining works like a filter. It will allow certain things to pass from your blood through the lining and into  a special solution that has been placed into your belly. In this type of dialysis, the peritoneum is used to help clean the blood.  If you need dialysis, your kidneys are not working right. Healthy kidneys take out extra water and waste products, which becomes urine. When the kidneys do not do this, serious problems can develop. The waste and water build up in the blood. Your hands and feet might swell. You may feel tired, weak or sick to your stomach. Also, your blood pressure may rise. If not treated, you could die. Dialysis is a treatment that does the work that your kidneys would do if they were healthy.  It cleans your blood.   It will make sure your body has the right amount of certain chemicals that it needs. They include potassium, sodium and bicarbonate.   It will help control your blood pressure.  UNDERSTANDING PERITONEAL DIALYSIS  Here is how peritoneal dialysis works:   First, you will have surgery to put a soft plastic tube (catheter) into your belly (abdomen). This will allow you to easily connect yourself to special tubing, which will then let a special dialysis solution to be placed into your abdomen.   For each treatment, you will  need at least one bag of dialysis solution (a liquid called dialysate). It is a mix of water that is pure and free of germs (sterile), sugar (dextrose) and the nutrients and minerals found in your blood. Sometimes, more than one bag is needed to get the right amount of fluid for your abdomen. Your caregiver will explain what size and how many bags you will need.   The dialysate is slowly put through the catheter to fill the abdomen (called the peritoneal cavity). This dialysate will need to stay in your body for 3-4 hours. This is known as the dwell time.   The solution is working to clean the blood and remove wastes from your body. At the end of this time, the solution is drained from your body through tubing into an empty bag. It is then replaced with a fresh dialysate.   The draining and replacing of the dialysate is called an exchange or cycle. The catheter is capped after each exchange. Once the solution is in your body, you are then free to do whatever you would like until the next exchange. Most people will need to do 4-5 exchanges each day.   There are two different methods that can be used.   Continuous ambulatory peritoneal dialysis (CAPD): You put the solution into your abdomen, cap your catheter and then go about your day. Several hours later, you reconnect to a tubing set up, drain out the solution and then put more solution in. This is done several times a day. No machine is needed.   Continuous cycler-assisted peritoneal dialysis (CCPD): A machine is used, which fills the abdomen with dialysate and then drains it. This happens several times. It usually is done at night while you are sleeping. When you wake up, you can disconnect from the machine and are free to go to go about your day.  PREPARING FOR EXCHANGES  Discuss the details of the procedure with your caregivers. You will be working with a nurse who is specially trained in doing dialysis. Make sure you understand:   How to do an  exchange.   How much solution you need.   What type of solution you will need.   How often you should do an exchange. Ask:  How many times each day?   When? At meals? At bedtime?   Always keep the dialysate bags and other supplies in a cool, clean and dry place.   Keeping everything clean is very important.   The catheter and its cap must be free from germs (sterile)   The adapter also must be sterile. It attaches the dialysis bag and tubing to the catheter.   Clean the area of your body around the catheter every day. Use a chemical that fights infection (antiseptic).   Wash your hands thoroughly before starting an exchange.   You may be taught to wear a mask to cover your nose and mouth. This makes infection less likely to happen.   You may be taught to close doors, windows and turn off any fans before doing an exchange.   Check the dialysate bag very carefully.   Make sure it is the right size bag for you. This information is on the label.   Also, make sure it is the right mixture. For some people, the dialysate contents vary. For instance, the mixture might be a stronger solution for overnight.   Check the expiration date (the last date you can use the bag). It also is on the label. If the date has gone by, throw away the bag.   The solution should be clear. You should be able to see any writing on the side of the bag clearly through the solution. Do not use a cloudy solution.   Gently squeeze the bag to make sure there are no leaks.   Use a dry heating pad to warm the dialysate in the bag. Leave the cover on the bag while you do this.   This is for comfort. You can skip this step if you want.   Never place the bag of solution under warm or hot water. Water from a faucet is not sterile and could cause germs to get into the bag. Infection could then result.  PERFORMING AN EXCHANGE  For continuous ambulatory dialysis:   Attach the dialysis bag and tubing to your  catheter. Hang the bag so that gravity (the natural downward pull) draws the solution down and into your abdomen once the clamps are opened. This should take about 10 minutes.   Remove the bag and tubing from the catheter. Cap the catheter.   The solution stays in the abdomen for 3-4 hours (dwell time). The solution is working to clean the blood and remove wastes from your body.   When you are ready to drain the solution for another exchange, take the cap off the catheter. Then, attach the catheter to tubing, which is attached to an empty bag. Place this empty bag below the abdomen or on the floor or stool and undo the clamps.   Gravity helps pull the fluid out of the abdomen and into the bag. The fluid in the bag may look yellow and clear, like urine. It usually takes about 20 minutes to drain the fluid out of the abdomen.   When the solution has drained, start the process again by infusing a new bag of dialysate and then capping the catheter.   This should continue until you have used all of the solution that you are to use each day.   Sometimes, a small machine is used overnight. It is called a mini-cycler. This is done if the body cannot go all night without an exchange. The machine lets you sleep without having to get up and  do an exchange.   For continuous cycler-assisted dialysis:   You will be taught how to set up or program your machine.   When you are ready for bed, put the dialysate bags onto the cycler machine. Put on exactly the number of bags that your caregiver said to use.   Connect your catheter to the machine and turn the cycler machine on.   Overnight, the cycler will do several exchanges. It often does three to five, sometimes more.   Solution that is in your abdomen in the morning will stay during the day. The machine is set to make the daytime solution stronger, if that is needed.   In the morning, you will disconnect from the machine and cap your catheter and go  about your day.   Sometimes, an extra exchange is done during the day. This may be needed to remove excess waste or fluid.  IMPORTANT REMINDERS  You will need to follow a very strict schedule. Every step of the dialysis procedure must be done every day. Sometimes, several times a day. Altogether, this might take an extra 2 hours or more. However, you must stick to the routine. Do not skip a day. Do not skip a procedure.   Some people find it helpful to work with a Social worker or Education officer, museum in addition to the renal (kidney) nurse. They can help you figure out how to change your daily routine to fit in the dialysis sessions.   You may need to change your diet. Ask your caregiver for advice, or talk with a nutritionist about what you should and should not eat.   You will need to weigh yourself every day and keep track of what your weight is.   You may be taught how to check your blood pressure before every exchange. Your blood pressure reading will help determine what type of solution to use. If your blood pressure is too high, you may need a stronger solution.  RISKS AND COMPLICATIONS  Possible problems vary, depending on the method you use. Your overall health also can have an effect. Problems that could develop because of dialysis include:  Infection. This is the most common problem. It could occur:   In the peritoneum. This is called peritonitis.   Around the catheter.   Weight gain. The dialysate contains a type of sugar known as dextrose. Dextrose has a lot of calories. The body takes in several hundred calories from this sugar each day.   Weakened muscles in the abdomen. This can result from all of the fluid that your body has to hold in the abdomen.   Catheter replacement. Sometimes, a new one has to be put in.   Change in dialysis method. Due to some complications, you may need to change to hemodialysis for a short time and have your dialysis done at a center.   Trouble  adjusting to your new lifestyle. In some people, this leads to depression.   Sleep problems.   Dialysis-related amyloidosis. This sometimes occurs after 5 years of dialysis. Protein builds up in the blood. This can cause painful deposits on bones, joints and tendons (which connect muscle to bone). Or, it can cause hollow spots in bones that make them more likely to break.   Excess fluid. Your body may absorb too much of the fluid that is held in the abdomen. This can lead to heart or lung problems.  SEEK MEDICAL CARE IF:   You have any problems with an exchange.  The area around the catheter becomes red or painful.   The catheter seems loose, or it feels like it is coming out.   A bag of dialysate looks cloudy. Or, the liquid is an unusual color.   Abdominal pain or discomfort.   You feel sick to your stomach (nauseous) or throw up (vomit).   You develop a fever of more than 102 F (38.9 C).  SEEK IMMEDIATE MEDICAL CARE IF:  You develop a fever of more than 102 F (38.9 C). Document Released: 03/24/2009 Document Revised: 05/16/2011 Document Reviewed: 03/24/2009 [facility] Patient Information 2012 [place].  Diet for Peritoneal Dialysis This diet may be modified in protein, sodium, phosphorus, potassium, or fluid, depending on your needs. The goals of nutrition therapy are similar to those for patients on hemodialysis. Providing enough protein to replace peritoneal losses is a priority. USES OF THIS DIET The diet is designed for the patient with end-stage kidney (renal) disease, who is treated by peritoneal dialysis. Treatment options include:  Continuous Ambulatory Peritoneal Dialysis (CAPD): Usually 4 exchanges of 1.5 to 2 liter volumes of glucose (sugar) and electrolyte-containing dialysate.   Continuous Cyclic Peritoneal Dialysis (CCPD): Essentially a reversal of CAPD, with shorter exchanges at night and a longer one during the day.   Intermittent Peritoneal Dialysis  (IPD): 10 to 12 hours of exchanges, 2 to 3 times weekly.  ADEQUACY The diet may not meet the Recommended Dietary Allowances of the [employer] for calcium and ascorbic acid. Protein and water-soluble vitamin needs may be increased because of losses into the dialysate. Recommended daily supplements are the same as for hemodialysis patients. ASSESSMENT/DETERMINATION OF DIET Dietary needs will differ between patients. Parameters must be individualized. Protein  Guidelines: 1.2 to 1.3 gm/kg/day OR 1.5 gm/kg/day if patient is malnourished, catabolic, or has a protracted episode of peritonitis. A minimum of 50% of the protein intake should be of high biological value.   Goals: Meet protein requirements and replace dialysate losses while avoiding excessive accumulation of waste products. Achieve serum albumin greater than 3.5 g/dL.   Evaluate: Current nutritional status, serum albumin and BUN levels, presence of peritonitis.  Sodium  Guidelines: Usually 90 to 175 mEq (2000 to 4000 mg), but should be individualized.   Goals: Minimize complications of fluid imbalance.   Evaluate: Weight, blood pressure regulation, and presence of swelling (edema).  Potassium  Guidelines: Individualized; often not restricted, and may need to be supplemented.   Goals: Serum K+ levels between 4.0 to 5.0 mEq/L.   Evaluate: Serum K+ levels, usual intake of K+, appetite.  Phosphorus  Guidelines: 800 to 1200 mg/day (the high protein intake results in a high obligatory P intake).   Goal: Serum P levels between 4.5 to 6.0 mg/dL.   Evaluate: Serum P levels, usual P intake, P-binding medications: type, number, dosage, distribution.  Fluids  Guidelines: Individualized - may not be restricted for all patients.   Goal: Minimize complications of fluid imbalance.   Evaluate: Weight, blood pressure regulation, sodium intake, and presence of edema.  Document Released: 05/27/2005 Document Revised:  05/16/2011 Document Reviewed: 08/19/2006 [facility] Patient Information 2012 [place].

## 2013-09-06 NOTE — H&P (View-Only) (Signed)
Subjective:     Patient ID: Mario Woods, male   DOB: 1961-05-26, 53 y.o.   MRN: [id number]  HPI  Note: This dictation was prepared with Dragon/digital dictation along with [employer]. Any transcriptional errors that result from this process are unintentional.       VANCIL GORZYNSKI  07-10-1960 [id number]  Patient Care Team: Robyn Haber, MD as PCP - General (Family Medicine) Lucrezia Starch, MD as Consulting Physician (Nephrology) [name], MD as Consulting Physician (General Surgery)  Procedure (Date: 08/03/2013):  POST-OPERATIVE DIAGNOSIS: END STAGE RENAL DISEASE/DIALYSIS DEPENDENT   PROCEDURE: Procedure(s):  LAPAROSCOPIC EXPLORATION OF THE ABDOMEN  INSERTION OF TUNNELED INTRAPERITONEAL CATHETER FOR DIALYSIS   SURGEON: Surgeon(s):  Adin Hector, MD  OR FINDINGS:  It is a long tunneled CAPD peritoneal dialysis catheter ([employer] [id number] curl cath with titanium extender) . The curl tip of the catheter rests in the deep pelvis. Entry into the peritoneum it is in the left suprapubic region just above the dome of the bladder. Deepest cuff in the preperitoneal space at this location. The exit site of the catheter on the skin is in the left upper abdomen subcostal region, 5 cm inferior to costal ridge, lateral clavicular line.  This patient returns for surgical re-evaluation.  He is walking well.  Having regular bowel movements.  Apparently the peritoneal dialysis catheter had stopped flushing well.  They were able to clean it out and flush it out and did flush better.  However does not return well.  Peritoneal dialysis nurses wished him to be reevaluated.  He is eating fine.  No urinary pressure.  No fevers chills or sweats.  No hematuria no pyuria.  Cells making some urine.  Some mild pressure with bowel movements but not severe.  Some discomfort when he jogs but not severe.  Overall improving.  Patient Active Problem List   Diagnosis Date Noted  .  Peritoneal dialysis catheter dysfunction 09/01/2013  . Obesity (BMI 30-39.9) 06/29/2013  . CKD (chronic kidney disease) stage V requiring chronic dialysis 06/29/2013  . H/O primary IgA nephropathy 02/16/2013  . H/O kidney transplant 02/16/2013    Past Medical History  Diagnosis Date  . H/O kidney transplant     takes Cellcept and Prograf daily  . Gout due to renal impairment     history (04/28/2012)  . Hypertension     but doesn't take any meds  . Hyperlipidemia     borderline but no meds  . Sleep apnea     sleep study done 12+yrs ago but wasn't confirmed with having sleep apnea so no cpap  . Pneumonia     hx of;last time 2010  . History of bronchitis     unsure of how long ago  . Tingling     fingers r/t meds he takes  . History of gout     but no meds required  . Diarrhea     med  . Kidney disease     M/W/F at [facility]  . Anemia   . History of blood transfusion     no abnormal reaction noted    Past Surgical History  Procedure Laterality Date  . Kidney transplant  04/2009  . Av fistula placement      Left  . Incisional hernia repair  04/27/2012    Procedure: LAPAROSCOPIC INCISIONAL HERNIA;  Surgeon: [name], MD;  Location: [place];  Service: General;  Laterality: N/A;  . Insertion of mesh  04/27/2012  Procedure: INSERTION OF MESH;  Surgeon: [name], MD;  Location: [place];  Service: General;  Laterality: N/A;  . Hernia repair  01/2009    primary ventral hernia repair x2   . Appendectomy  01/2009  . Capd insertion N/A 08/03/2013    Procedure: LAPAROSCOPIC EXPLORATION OF THE ABDOMEN WITH PLACEMENT OF PERITONEAL DIALYSIS CATHETER;  Surgeon: Adin Hector, MD;  Location: [place];  Service: General;  Laterality: N/A;    History   Social History  . Marital Status: Married    Spouse Name: N/A    Number of Children: N/A  . Years of Education: N/A   Occupational History  . [place]   Social History Main Topics  . Smoking status: Former  Smoker -- 0.50 packs/day for 8 years    Types: Cigarettes  . Smokeless tobacco: Never Used     Comment: quit smoking about 50yrs ago  . Alcohol Use: Yes     Comment: minimal  . Drug Use: No  . Sexual Activity: Yes   Other Topics Concern  . Not on file   Social History Narrative  . No narrative on file    Family History  Problem Relation Age of Onset  . Pancreatic cancer      Current Outpatient Prescriptions  Medication Sig Dispense Refill  . aspirin 81 MG chewable tablet Chew 81 mg by mouth every morning.      . Multiple Vitamin (MULTIVITAMIN WITH MINERALS) TABS tablet Take 1 tablet by mouth daily.      . mycophenolate (CELLCEPT) 250 MG capsule Take 500 mg by mouth 2 (two) times daily.       . predniSONE (DELTASONE) 10 MG tablet Take 10 mg by mouth daily with breakfast.      . tacrolimus (PROGRAF) 1 MG capsule Take 4 mg by mouth 2 (two) times daily.       . traMADol (ULTRAM) 50 MG tablet Take 1-2 tablets (50-100 mg total) by mouth every 6 (six) hours as needed for moderate pain or severe pain.  30 tablet  0   No current facility-administered medications for this visit.     Allergies  Allergen Reactions  . Tape Rash    Paper tape ok    There were no vitals taken for this visit.  No results found.   Review of Systems  Constitutional: Negative for fever, chills and diaphoresis.  HENT: Negative for sore throat and trouble swallowing.   Eyes: Negative for photophobia and visual disturbance.  Respiratory: Negative for choking and shortness of breath.   Cardiovascular: Negative for chest pain and palpitations.  Gastrointestinal: Negative for nausea, vomiting, abdominal distention, anal bleeding and rectal pain.  Genitourinary: Negative for dysuria, urgency, difficulty urinating and testicular pain.  Musculoskeletal: Negative for arthralgias, gait problem, myalgias and neck pain.  Skin: Negative for color change and rash.  Neurological: Negative for dizziness, speech  difficulty, weakness and numbness.  Hematological: Negative for adenopathy.  Psychiatric/Behavioral: Negative for hallucinations, confusion and agitation.       Objective:   Physical Exam  Constitutional: He is oriented to person, place, and time. He appears well-developed and well-nourished. No distress.  HENT:  Head: Normocephalic.  Mouth/Throat: Oropharynx is clear and moist. No oropharyngeal exudate.  Eyes: Conjunctivae and EOM are normal. Pupils are equal, round, and reactive to light. No scleral icterus.  Neck: Normal range of motion. No tracheal deviation present.  Cardiovascular: Normal rate, normal heart sounds and intact distal pulses.   Pulmonary/Chest:  Effort normal. No respiratory distress.  Abdominal: Soft. He exhibits no distension. There is no tenderness. Hernia confirmed negative in the right inguinal area and confirmed negative in the left inguinal area.    Incisions clean with normal healing ridges.  No hernias  Musculoskeletal: Normal range of motion. He exhibits no tenderness.  Neurological: He is alert and oriented to person, place, and time. No cranial nerve deficit. He exhibits normal muscle tone. Coordination normal.  Skin: Skin is warm and dry. No rash noted. He is not diaphoretic.  Psychiatric: He has a normal mood and affect. His behavior is normal.       Assessment:     Poorly functioning peritoneal dialysis catheter with good flushing but poor return.  Question of malpositioning vs adhesions     Plan:     I think it is reasonable to perform laparoscopic aspiration and see if the catheter needs to be repositioned, unclogged, or adhesions to its lysed and protected against.  He received I can get some Seprafilm to prevent adhesions to it.  I may need to replace it, but if it flushes well and is well positioned skeptical that that we will need to happen.  He wishes to try and see if it can be salvaged:  The anatomy & physiology of peritoneum was  discussed.  Natural history risks without surgery of worsening renal failure was discussed.   I feel the risks of no intervention will lead to serious problems that outweigh the operative risks; therefore, I recommended repositioning / replaceement of a peritoneal dialysis catheter.  I explained laparoscopic techniques with possible need for an open approach.  Probable omentopexy.  Risks such as bleeding, infection, abscess, injury to other organs, catheter occlusion or malpositioning, reoperation to remove/reposition the catheter, heart attack, death, and other risks were discussed.   I noted a good likelihood this will help address the problem.  Possibility that this will not be enough to compensate for the renal failure & need for further treatment such as hemodialysis was explained.  Goals of post-operative recovery were discussed as well.  We will work to minimize complications.   The patient is/will be getting training on catheter use by dialysis nursing before and after surgery.  I stressed the importance of meticulous care & sterile technique to prevent catheter problems.  Questions were answered.  The patient expresses understanding & wishes to proceed with surgery.

## 2013-09-06 NOTE — Transfer of Care (Signed)
Immediate Anesthesia Transfer of Care Note  Patient: Mario Woods  Procedure(s) Performed: Procedure(s): DIAGNOSTIC LAPAROSCOPY WITH  REPOSITIONING OF CONTINUOUS AMBULATORY PERITONEAL DIALYSIS  (CAPD) CATHETER  (N/A)  Patient Location: PACU  Anesthesia Type:General  Level of Consciousness: awake, alert  and oriented  Airway & Oxygen Therapy: Patient Spontanous Breathing and Patient connected to nasal cannula oxygen  Post-op Assessment: Report given to PACU RN, Post -op Vital signs reviewed and stable and Patient moving all extremities X 4  Post vital signs: Reviewed and stable  Complications: No apparent anesthesia complications

## 2013-09-06 NOTE — Interval H&P Note (Signed)
History and Physical Interval Note:  09/06/2013 6:33 AM  Mario Woods  has presented today for surgery, with the diagnosis of end stage renal   The various methods of treatment have been discussed with the patient and family. After consideration of risks, benefits and other options for treatment, the patient has consented to  Procedure(s): LAPAROSCOPIC INSERTION CONTINUOUS AMBULATORY PERITONEAL DIALYSIS  (CAPD) CATHETER possible lysis of adhesion  (N/A) as a surgical intervention .  The patient's history has been reviewed, patient examined, no change in status, stable for surgery.  I have reviewed the patient's chart and labs.  Questions were answered to the patient's satisfaction.     [name]

## 2013-09-06 NOTE — Anesthesia Preprocedure Evaluation (Addendum)
Anesthesia Evaluation  Patient identified by MRN, date of birth, ID band Patient awake    Reviewed: Allergy & Precautions, H&P , NPO status , Patient's Chart, lab work & pertinent test results  Airway Mallampati: III TM Distance: <3 FB Neck ROM: Full    Dental  (+) Teeth Intact, Dental Advisory Given   Pulmonary former smoker,  breath sounds clear to auscultation  Pulmonary exam normal       Cardiovascular hypertension (controlled with dialysis), Rhythm:Regular Rate:Normal  '10 stress test: normal perfusion '10 ECHO: normal LVF, EF 55-60%   Neuro/Psych negative neurological ROS     GI/Hepatic negative GI ROS, Neg liver ROS,   Endo/Other  Morbid obesity  Renal/GU ESRF and DialysisRenal disease (MWF, but last dialyzed sat, K+ 4.2)S/p renal transplant: steroids, IgA nephropathy     Musculoskeletal   Abdominal (+) + obese,   Peds  Hematology  (+) Blood dyscrasia (Hb 9.9), anemia ,   Anesthesia Other Findings   Reproductive/Obstetrics                       Anesthesia Physical Anesthesia Plan  ASA: III  Anesthesia Plan: General   Post-op Pain Management:    Induction: Intravenous  Airway Management Planned: Oral ETT  Additional Equipment:   Intra-op Plan:   Post-operative Plan: Extubation in OR  Informed Consent: I have reviewed the patients History and Physical, chart, labs and discussed the procedure including the risks, benefits and alternatives for the proposed anesthesia with the patient or authorized representative who has indicated his/her understanding and acceptance.   Dental advisory given  Plan Discussed with: CRNA, Anesthesiologist and Surgeon  Anesthesia Plan Comments: (Plan routine monitors, GETA)       Anesthesia Quick Evaluation

## 2013-09-06 NOTE — Op Note (Signed)
09/06/2013  8:54 AM  PATIENT:  Mario Woods  53 y.o. male  Patient Care Team: Robyn Haber, MD as PCP - General (Family Medicine) Lucrezia Starch, MD as Consulting Physician (Nephrology) [name], MD as Consulting Physician (General Surgery)  PRE-OPERATIVE DIAGNOSIS:  end stage renal disease  POST-OPERATIVE DIAGNOSIS:  end stage renal disease, dialysis dependent  PROCEDURE:  Procedure(s): DIAGNOSTIC LAPAROSCOPY WITH  REPOSITIONING OF CONTINUOUS AMBULATORY PERITONEAL DIALYSIS  (CAPD) CATHETER   SURGEON:  Surgeon(s): Adin Hector, MD  ASSISTANT: RN   ANESTHESIA:   local and general  EBL:  Total I/O In: 300 [I.V.:300] Out: -   Delay start of Pharmacological VTE agent (>24hrs) due to surgical blood loss or risk of bleeding:  no  DRAINS: Medionic CAPD peritoneal dialysis catheter exit site unchanged per   SPECIMEN:  Source of Specimen:  peritoneal dialysis catheter coagulum/fibrin  DISPOSITION OF SPECIMEN:  microbiology  COUNTS:  YES  PLAN OF CARE: Discharge to home after PACU  PATIENT DISPOSITION:  PACU - hemodynamically stable.   INDICATION: Pleasant patient with chronic renal failure. Progressed to the point of needing dialysis. Had a CAPD peritoneal dialysis catheter placed in the past.  Initially flushed and return oral.  However, poor returns more recently.  Concern for dysfunction.  Request made for evaluation.  Because it is not return well, I am suspicious that it is malpositioned and needs to be repositioned.  I also discussed the possibility of removal and replacement of new catheter:   The anatomy & physiology of peritoneum was discussed. Natural history risks without surgery of worsening renal failure was discussed. I feel the risks of no intervention will lead to serious problems that outweigh the operative risks; therefore, I recommended repositioning / replaceement of a peritoneal dialysis catheter. I explained laparoscopic techniques with  possible need for an open approach. Probable omentopexy.   Risks such as bleeding, infection, abscess, injury to other organs, reoperation, heart attack, death, and other risks were discussed. I noted a good likelihood this will help address the problem. Need for further treatment such as hemodialysis was explained. Goals of post-operative recovery were discussed as well. We will work to minimize complications. Questions were answered. The patient expresses understanding & wishes to proceed with surgery.   OR FINDINGS:  Intact cuffs.   Peritoneal dialysis catheter flowed with some old clot and fibrinous material but not obstructed.  It flushed well.  Shepherd crook crural flipped up into right lower quadrant.  Adherence between the sigmoid and ileal mesentery with mild inflammation.  No pus/abscess.  Catheter curl tip now repositioned in the deep cul-de-sac.  2-0 Prolene stitch on base of bladder holding catheter in deep pelvis.  No evidence of cellulitis or abscess in subcutaneous tissues or in the deeper abdominal wall   DESCRIPTION:  Informed consent was confirmed. The patient received IV antibiotics. The patient underwent general anesthesia without any difficulty. The patient was positioned in low lithotomy.  SCDs were active during the entire case. The abdomen was prepped and draped in a sterile fashion. A surgical timeout confirmed our plan.   I entered the abdomen using optical entry technique with the patient Steverson Trendelenburg right side up using a 50 camera.  Entry was clean.  Inspection revealed omentum adherent to old mesh as before.  I placed 5 mm port in the right lower abdomen.  I upsize the right upper quadrant port to an 8 mm port site.  Placed a final motor in the right flank.  I did laparoscopic exploration.  The catheter easily flushed.   I could see it curled around the ileal and sigmoid mesentery.  Especially epiploic appendages adherent to it.  Some fibrous material.  I was  able to free the catheter out without difficulty.  I milked out a moderate volume of fibrous material and old blood clot.  Flushed with 500 mL of heparinized saline to help unclog it.  It flushed quite easily.  I sent the fibrin/clot material for culture.  I aspirated the 500 mL of heparinized saline easily.  I repositioned the CAPD curl tip of the catheter down into the anterior cul de sac of the the true pelvis, away from the sigmoid and ileal mesentery is.  I placed a 2-0 Prolene stitch at the base of the midline bladder.  I wrapped it around the catheter and gently tied it.  I reflushed with catheter with heparinized saline.  The CAPD catheter flushed and returned quite easily.  The curl tip no longer flipped up.  Hemostasis was excellent.  I saw no abnormalities elsewhere.  No hernias.  I removed carbon dioxide and ports.  The 8 mm port wound was in the right upper quadrant and had been tracked obliquely.  There was no significant fascial defect.  I could not get my pinky through.  Therefore, I did not close the fascia more aggressively.  I closed the skin with 4-0 monocryl stitch.  Sterile dressings applied.  Catheter reattached to the external component.  Patient being extubated to recovery room.  I updated the patient's status to the family.  Recommendations were made.  Questions were answered.  The family expressed understanding & appreciation.

## 2013-09-06 NOTE — Anesthesia Procedure Notes (Signed)
Procedure Name: Intubation Date/Time: 09/06/2013 7:53 AM Performed by: Trixie Deis A Pre-anesthesia Checklist: Patient identified, Timeout performed, Emergency Drugs available, Suction available and Patient being monitored Patient Re-evaluated:Patient Re-evaluated prior to inductionOxygen Delivery Method: Circle system utilized Preoxygenation: Pre-oxygenation with 100% oxygen Intubation Type: IV induction Ventilation: Mask ventilation without difficulty Laryngoscope Size: Mac and 4 Grade View: Grade I Tube type: Oral Tube size: 7.5 mm Number of attempts: 1 Airway Equipment and Method: Stylet Placement Confirmation: ETT inserted through vocal cords under direct vision,  breath sounds checked- equal and bilateral and positive ETCO2 Secured at: 23 cm Tube secured with: Tape Dental Injury: Teeth and Oropharynx as per pre-operative assessment

## 2013-09-07 ENCOUNTER — Encounter (HOSPITAL_COMMUNITY): Payer: Self-pay | Admitting: Surgery

## 2013-09-09 LAB — TISSUE CULTURE
Culture: NO GROWTH
GRAM STAIN: NONE SEEN

## 2013-09-11 LAB — ANAEROBIC CULTURE: Gram Stain: NONE SEEN

## 2013-11-26 ENCOUNTER — Encounter: Payer: Self-pay | Admitting: Family Medicine

## 2013-11-26 DIAGNOSIS — M7660 Achilles tendinitis, unspecified leg: Secondary | ICD-10-CM | POA: Insufficient documentation

## 2014-01-31 ENCOUNTER — Encounter (INDEPENDENT_AMBULATORY_CARE_PROVIDER_SITE_OTHER): Payer: Self-pay | Admitting: Surgery

## 2014-01-31 ENCOUNTER — Ambulatory Visit (INDEPENDENT_AMBULATORY_CARE_PROVIDER_SITE_OTHER): Payer: 59 | Admitting: Surgery

## 2014-01-31 VITALS — BP 138/97 | HR 95 | Temp 98.0°F | Ht 77.0 in | Wt 299.6 lb

## 2014-01-31 DIAGNOSIS — T85611S Breakdown (mechanical) of intraperitoneal dialysis catheter, sequela: Secondary | ICD-10-CM

## 2014-01-31 DIAGNOSIS — T889XXS Complication of surgical and medical care, unspecified, sequela: Secondary | ICD-10-CM

## 2014-01-31 DIAGNOSIS — Z992 Dependence on renal dialysis: Secondary | ICD-10-CM

## 2014-01-31 DIAGNOSIS — N186 End stage renal disease: Secondary | ICD-10-CM

## 2014-01-31 NOTE — Patient Instructions (Signed)
Please consider the recommendations that we have given you today:  Plan removal of the peritoneal dialysis catheter soon.  See the Handout(s) we have given you.  Please call our office at [phone] if you wish to schedule surgery or if you have further questions / concerns.   GENERAL SURGERY: POST OP INSTRUCTIONS  1. DIET: Follow a light bland diet the first 24 hours after arrival home, such as soup, liquids, crackers, etc.  Be sure to include lots of fluids daily.  Avoid fast food or heavy meals as your are more likely to get nauseated.   2. Take your usually prescribed home medications unless otherwise directed. 3. PAIN CONTROL: a. Pain is best controlled by a usual combination of three different methods TOGETHER: i. Ice/Heat ii. Over the counter pain medication iii. Prescription pain medication b. Most patients will experience some swelling and bruising around the incisions.  Ice packs or heating pads (30-60 minutes up to 6 times a day) will help. Use ice for the first few days to help decrease swelling and bruising, then switch to heat to help relax tight/sore spots and speed recovery.  Some people prefer to use ice alone, heat alone, alternating between ice & heat.  Experiment to what works for you.  Swelling and bruising can take several weeks to resolve.   c. It is helpful to take an over-the-counter pain medication regularly for the first few weeks.  Choose one of the following that works best for you: i. Naproxen (Aleve, etc)  Two 220mg  tabs twice a day ii. Ibuprofen (Advil, etc) Three 200mg  tabs four times a day (every meal & bedtime) iii. Acetaminophen (Tylenol, etc) 500-650mg  four times a day (every meal & bedtime) d. A  prescription for pain medication (such as oxycodone, hydrocodone, etc) should be given to you upon discharge.  Take your pain medication as prescribed.  i. If you are having problems/concerns with the prescription medicine (does not control pain, nausea,  vomiting, rash, itching, etc), please call us [phone] to see if we need to switch you to a different pain medicine that will work better for you and/or control your side effect better. ii. If you need a refill on your pain medication, please contact your pharmacy.  They will contact our office to request authorization. Prescriptions will not be filled after 5 pm or on week-ends. 4. Avoid getting constipated.  Between the surgery and the pain medications, it is common to experience some constipation.  Increasing fluid intake and taking a fiber supplement (such as Metamucil, Citrucel, FiberCon, MiraLax, etc) 1-2 times a day regularly will usually help prevent this problem from occurring.  A mild laxative (prune juice, Milk of Magnesia, MiraLax, etc) should be taken according to package directions if there are no bowel movements after 48 hours.   5. Wash / shower every day.  You may shower over the dressings as they are waterproof.  Continue to shower over incision(s) after the dressing is off. 6. Remove your waterproof bandages 5 days after surgery.  You may leave the incision open to air.  You may have skin tapes (Steri Strips) covering the incision(s).  Leave them on until one week, then remove.  You may replace a dressing/Band-Aid to cover the incision for comfort if you wish.      7. ACTIVITIES as tolerated:   a. You may resume regular (light) daily activities beginning the next day-such as daily self-care, walking, climbing stairs-gradually increasing activities as tolerated.  If you  can walk 30 minutes without difficulty, it is safe to try more intense activity such as jogging, treadmill, bicycling, low-impact aerobics, swimming, etc. b. Save the most intensive and strenuous activity for last such as sit-ups, heavy lifting, contact sports, etc  Refrain from any heavy lifting or straining until you are off narcotics for pain control.   c. DO NOT PUSH THROUGH PAIN.  Let pain be your guide: If it  hurts to do something, don't do it.  Pain is your body warning you to avoid that activity for another week until the pain goes down. d. You may drive when you are no longer taking prescription pain medication, you can comfortably wear a seatbelt, and you can safely maneuver your car and apply brakes. e. Dennis Bast may have sexual intercourse when it is comfortable.  8. FOLLOW UP in our office a. Please call [facility] at [phone] to set up an appointment to see your surgeon in the office for a follow-up appointment approximately 2-3 weeks after your surgery. b. Make sure that you call for this appointment the day you arrive home to insure a convenient appointment time. 9. IF YOU HAVE DISABILITY OR FAMILY LEAVE FORMS, BRING THEM TO THE OFFICE FOR PROCESSING.  DO NOT GIVE THEM TO YOUR DOCTOR.   WHEN TO CALL us [phone]: 1. Poor pain control 2. Reactions / problems with new medications (rash/itching, nausea, etc)  3. Fever over 101.5 F (38.5 C) 4. Worsening swelling or bruising 5. Continued bleeding from incision. 6. Increased pain, redness, or drainage from the incision 7. Difficulty breathing / swallowing   The clinic staff is available to answer your questions during regular business hours (8:30am-5pm).  Please don't hesitate to call and ask to speak to one of our nurses for clinical concerns.   If you have a medical emergency, go to the nearest emergency room or call 911.  A surgeon from [facility] is always on call at the [facility], [place], [place], [place], [place]  [address] ? MAIN: [phone] ? TOLL FREE: [phone] ?  FAX [phone] [email or web address]

## 2014-01-31 NOTE — Progress Notes (Signed)
Subjective:     Patient ID: Mario Woods, male   DOB: 05-02-1961, 53 y.o.   MRN: [id number]  HPI   Note: This dictation was prepared with Dragon/digital dictation along with [employer]. Any transcriptional errors that result from this process are unintentional.       Mario Woods  19-Oct-1960 [id number]  Patient Care Team: Robyn Haber, MD as PCP - General (Family Medicine) Lucrezia Starch, MD as Consulting Physician (Nephrology) [name], MD as Consulting Physician (General Surgery)  Procedure (Date: 08/03/2013):  POST-OPERATIVE DIAGNOSIS: END STAGE RENAL DISEASE/DIALYSIS DEPENDENT   PROCEDURE: Procedure(s):  LAPAROSCOPIC EXPLORATION OF THE ABDOMEN  INSERTION OF TUNNELED INTRAPERITONEAL CATHETER FOR DIALYSIS   SURGEON: Surgeon(s):  Adin Hector, MD  Procedure (Date: 09/06/2013):  POST-OPERATIVE DIAGNOSIS: end stage renal disease, dialysis dependent   PROCEDURE: Procedure(s):  DIAGNOSTIC LAPAROSCOPY WITH REPOSITIONING OF CONTINUOUS AMBULATORY PERITONEAL DIALYSIS (CAPD) CATHETER  SURGEON: Surgeon(s):  Adin Hector, MD  ASSISTANT: RN   Patient comes in today wishing to have his peritoneal dialysis catheter around.  Since it was repositioned, and has been working okay.  He could not tolerate the machine extractions as it was painful near the end of the round.  He needed 5 volume exchange doing it manually.  Became more burdensome then he thought.  He is back on hemodialysis qMWF through a left forearm AV fistula.  Therefore wished to have it removed.    Patient Active Problem List   Diagnosis Date Noted  . Achilles tendonitis 11/26/2013  . Peritoneal dialysis catheter dysfunction s/p lap repositioning 09/06/2013 09/01/2013  . Obesity (BMI 30-39.9) 06/29/2013  . CKD (chronic kidney disease) stage V requiring chronic dialysis 06/29/2013  . H/O primary IgA nephropathy 02/16/2013  . H/O kidney transplant 02/16/2013    Past Medical History    Diagnosis Date  . H/O kidney transplant     takes Cellcept and Prograf daily  . Gout due to renal impairment     history (04/28/2012)  . Hyperlipidemia     borderline but no meds  . Pneumonia     hx of;last time 2010  . History of bronchitis     unsure of how long ago  . Tingling     fingers r/t meds he takes  . History of gout     but no meds required  . Diarrhea     med  . Anemia   . History of blood transfusion     no abnormal reaction noted  . Kidney disease     M/W/F at [facility]  . Hypertension     but doesn't take any meds  . Sleep apnea     sleep study done 12+yrs ago but wasn't confirmed with having sleep apnea so no cpap  . Shortness of breath     With exertion    Past Surgical History  Procedure Laterality Date  . Kidney transplant  04/2009  . Av fistula placement      Left  . Incisional hernia repair  04/27/2012    Procedure: LAPAROSCOPIC INCISIONAL HERNIA;  Surgeon: [name], MD;  Location: [place];  Service: General;  Laterality: N/A;  . Insertion of mesh  04/27/2012    Procedure: INSERTION OF MESH;  Surgeon: [name], MD;  Location: [place];  Service: General;  Laterality: N/A;  . Hernia repair  01/2009    primary ventral hernia repair x2   . Appendectomy  01/2009  . Capd insertion N/A 08/03/2013  Procedure: LAPAROSCOPIC EXPLORATION OF THE ABDOMEN WITH PLACEMENT OF PERITONEAL DIALYSIS CATHETER;  Surgeon: Adin Hector, MD;  Location: [place];  Service: General;  Laterality: N/A;  . Capd insertion N/A 09/06/2013    Procedure: DIAGNOSTIC LAPAROSCOPY WITH  REPOSITIONING OF CONTINUOUS AMBULATORY PERITONEAL DIALYSIS  (CAPD) CATHETER ;  Surgeon: Adin Hector, MD;  Location: [place];  Service: General;  Laterality: N/A;    History   Social History  . Marital Status: Married    Spouse Name: N/A    Number of Children: N/A  . Years of Education: N/A   Occupational History  . [employer]   Social History Main Topics  . Smoking status:  Former Smoker -- 8 years    Types: Cigarettes  . Smokeless tobacco: Never Used     Comment: quit smoking about 94yrs ago  . Alcohol Use: Yes     Comment: minimal  . Drug Use: No  . Sexual Activity: Yes   Other Topics Concern  . Not on file   Social History Narrative  . No narrative on file    Family History  Problem Relation Age of Onset  . Pancreatic cancer      Current Outpatient Prescriptions  Medication Sig Dispense Refill  . aspirin 81 MG chewable tablet Chew 81 mg by mouth every morning.      . Multiple Vitamin (MULTIVITAMIN WITH MINERALS) TABS tablet Take 1 tablet by mouth daily.      . predniSONE (DELTASONE) 10 MG tablet Take 5 mg by mouth daily with breakfast.       . tacrolimus (PROGRAF) 1 MG capsule Take 3 mg by mouth 2 (two) times daily.        No current facility-administered medications for this visit.     Allergies  Allergen Reactions  . Tape Rash    Paper tape ok    BP 138/97  Pulse 95  Temp(Src) 98 F (36.7 C) (Oral)  Ht 6\' 5"  (1.956 m)  Wt 299 lb 9.6 oz (135.898 kg)  BMI 35.52 kg/m2  No results found.   Review of Systems  Constitutional: Negative for fever, chills and diaphoresis.  HENT: Negative for sore throat and trouble swallowing.   Eyes: Negative for photophobia and visual disturbance.  Respiratory: Negative for choking and shortness of breath.   Cardiovascular: Negative for chest pain and palpitations.  Gastrointestinal: Negative for nausea, vomiting, abdominal distention, anal bleeding and rectal pain.  Genitourinary: Negative for dysuria, urgency, difficulty urinating and testicular pain.  Musculoskeletal: Negative for arthralgias, gait problem, myalgias and neck pain.  Skin: Negative for color change and rash.  Neurological: Negative for dizziness, speech difficulty, weakness and numbness.  Hematological: Negative for adenopathy.  Psychiatric/Behavioral: Negative for hallucinations, confusion and agitation.       Objective:    Physical Exam  Constitutional: He is oriented to person, place, and time. He appears well-developed and well-nourished. No distress.  HENT:  Head: Normocephalic.  Mouth/Throat: Oropharynx is clear and moist. No oropharyngeal exudate.  Eyes: Conjunctivae and EOM are normal. Pupils are equal, round, and reactive to light. No scleral icterus.  Neck: Normal range of motion. No tracheal deviation present.  Cardiovascular: Normal rate, normal heart sounds and intact distal pulses.   Pulmonary/Chest: Effort normal. No respiratory distress.  Abdominal: Soft. He exhibits no distension. There is no tenderness. Hernia confirmed negative in the right inguinal area and confirmed negative in the left inguinal area.    Incisions clean with normal healing ridges.  No hernias  Musculoskeletal: Normal range of motion. He exhibits no tenderness.  Neurological: He is alert and oriented to person, place, and time. No cranial nerve deficit. He exhibits normal muscle tone. Coordination normal.  Skin: Skin is warm and dry. No rash noted. He is not diaphoretic.  Psychiatric: He has a normal mood and affect. His behavior is normal.       Assessment:     Intolerance to peritoneal dialysis with desire to remove peritoneal dialysis catheter     Plan:     Plan removal.  Because of the 3 cuffs , one subfascial,  this needs anesthesia and sedation to do this.  Plan on off dialysis today.  (Therefore Tuesday/Thursday):  The anatomy & physiology of peritoneum was discussed.  Natural history risks without surgery of worsening renal failure was discussed.   I feel the risks of no intervention will lead to serious problems that outweigh the operative risks; therefore, I recommended removal of the CAPD peritoneal dialysis catheter.  I techniques with probable open approach.   Risks such as bleeding, infection, abscess, injury to other organs, reoperation heart attack, death, and other risks were discussed.   I noted a good  likelihood this will help address the problem.  Possibility that this will not be enough to compensate for the renal failure & need for further treatment such as hemodialysis was explained.  Goals of post-operative recovery were discussed as well.  We will work to minimize complications.  Questions were answered.  The patient expresses understanding & wishes to proceed with surgery.

## 2014-02-02 ENCOUNTER — Telehealth (INDEPENDENT_AMBULATORY_CARE_PROVIDER_SITE_OTHER): Payer: Self-pay

## 2014-02-02 NOTE — Telephone Encounter (Signed)
[facility] called to let Dr Johney Maine and Lars Mage know they do not office notes to fax over that they requested. Pt was just referred to [place] without being seen. Advised we will let them know.

## 2014-02-03 ENCOUNTER — Encounter (HOSPITAL_COMMUNITY): Payer: Self-pay | Admitting: Pharmacy Technician

## 2014-02-07 ENCOUNTER — Encounter (HOSPITAL_COMMUNITY): Payer: Self-pay | Admitting: *Deleted

## 2014-02-07 MED ORDER — DEXTROSE 5 % IV SOLN
3.0000 g | INTRAVENOUS | Status: AC
  Administered 2014-02-08: 3 g via INTRAVENOUS
  Filled 2014-02-07: qty 3000

## 2014-02-07 NOTE — Progress Notes (Signed)
02/07/14 1844  OBSTRUCTIVE SLEEP APNEA  Have you ever been diagnosed with sleep apnea through a sleep study? No  If yes, do you have and use a CPAP or BPAP machine every night? 0  Do you snore loudly (loud enough to be heard through closed doors)?  1  Do you often feel tired, fatigued, or sleepy during the daytime? 1  Has anyone observed you stop breathing during your sleep? 0  Do you have, or are you being treated for high blood pressure? 1  BMI more than 35 kg/m2? 1  Age over 51 years old? 1  Neck circumference greater than 40 cm/16 inches? 1 (17)  Gender: 1  Obstructive Sleep Apnea Score 7

## 2014-02-08 ENCOUNTER — Encounter (HOSPITAL_COMMUNITY)
Admission: RE | Disposition: A | Discharge status: Home / Self Care | Payer: Self-pay | Source: Ambulatory Visit | Attending: Surgery

## 2014-02-08 ENCOUNTER — Encounter (HOSPITAL_COMMUNITY): Payer: Self-pay | Admitting: Certified Registered"

## 2014-02-08 ENCOUNTER — Encounter (HOSPITAL_COMMUNITY): Payer: 59 | Admitting: Certified Registered"

## 2014-02-08 ENCOUNTER — Ambulatory Visit (HOSPITAL_COMMUNITY)
Admission: RE | Admit: 2014-02-08 | Discharge: 2014-02-08 | Disposition: A | Discharge status: Home / Self Care | Payer: 59 | Source: Ambulatory Visit | Attending: Surgery | Admitting: Surgery

## 2014-02-08 ENCOUNTER — Ambulatory Visit (HOSPITAL_COMMUNITY): Payer: 59 | Admitting: Certified Registered"

## 2014-02-08 DIAGNOSIS — Z992 Dependence on renal dialysis: Secondary | ICD-10-CM | POA: Insufficient documentation

## 2014-02-08 DIAGNOSIS — Z9109 Other allergy status, other than to drugs and biological substances: Secondary | ICD-10-CM | POA: Insufficient documentation

## 2014-02-08 DIAGNOSIS — R0602 Shortness of breath: Secondary | ICD-10-CM | POA: Insufficient documentation

## 2014-02-08 DIAGNOSIS — Z4902 Encounter for fitting and adjustment of peritoneal dialysis catheter: Secondary | ICD-10-CM | POA: Insufficient documentation

## 2014-02-08 DIAGNOSIS — E785 Hyperlipidemia, unspecified: Secondary | ICD-10-CM | POA: Diagnosis not present

## 2014-02-08 DIAGNOSIS — N186 End stage renal disease: Secondary | ICD-10-CM

## 2014-02-08 DIAGNOSIS — Z87891 Personal history of nicotine dependence: Secondary | ICD-10-CM | POA: Diagnosis not present

## 2014-02-08 DIAGNOSIS — I120 Hypertensive chronic kidney disease with stage 5 chronic kidney disease or end stage renal disease: Secondary | ICD-10-CM | POA: Diagnosis not present

## 2014-02-08 DIAGNOSIS — Z9089 Acquired absence of other organs: Secondary | ICD-10-CM | POA: Diagnosis not present

## 2014-02-08 DIAGNOSIS — E669 Obesity, unspecified: Secondary | ICD-10-CM | POA: Insufficient documentation

## 2014-02-08 DIAGNOSIS — M109 Gout, unspecified: Secondary | ICD-10-CM | POA: Diagnosis not present

## 2014-02-08 DIAGNOSIS — Z940 Kidney transplant status: Secondary | ICD-10-CM | POA: Diagnosis not present

## 2014-02-08 DIAGNOSIS — N185 Chronic kidney disease, stage 5: Secondary | ICD-10-CM | POA: Insufficient documentation

## 2014-02-08 HISTORY — PX: CAPD REMOVAL: SHX5234

## 2014-02-08 LAB — POCT I-STAT 4, (NA,K, GLUC, HGB,HCT)
Glucose, Bld: 94 mg/dL (ref 70–99)
HCT: 28 % — ABNORMAL LOW (ref 39.0–52.0)
Hemoglobin: 9.5 g/dL — ABNORMAL LOW (ref 13.0–17.0)
Potassium: 4.7 mEq/L (ref 3.7–5.3)
Sodium: 139 mEq/L (ref 137–147)

## 2014-02-08 SURGERY — CONTINUOUS AMBULATORY PERITONEAL DIALYSIS  (CAPD) CATHETER REMOVAL
Anesthesia: Regional | Site: Abdomen

## 2014-02-08 MED ORDER — MEPERIDINE HCL 25 MG/ML IJ SOLN: 6.2500 mg | INTRAMUSCULAR | Status: DC | PRN

## 2014-02-08 MED ORDER — PHENOL 1.4 % MT LIQD: 2.0000 | OROMUCOSAL | Status: DC | PRN

## 2014-02-08 MED ORDER — BUPIVACAINE-EPINEPHRINE 0.25% -1:200000 IJ SOLN
INTRAMUSCULAR | Status: DC | PRN
  Administered 2014-02-08: 30 mL

## 2014-02-08 MED ORDER — LIDOCAINE HCL (CARDIAC) 20 MG/ML IV SOLN
INTRAVENOUS | Status: AC
  Filled 2014-02-08: qty 5

## 2014-02-08 MED ORDER — PHENYLEPHRINE HCL 10 MG/ML IJ SOLN
INTRAMUSCULAR | Status: DC | PRN
  Administered 2014-02-08: 80 ug via INTRAVENOUS

## 2014-02-08 MED ORDER — FENTANYL CITRATE 0.05 MG/ML IJ SOLN
INTRAMUSCULAR | Status: AC
  Filled 2014-02-08: qty 5

## 2014-02-08 MED ORDER — ONDANSETRON HCL 4 MG/2ML IJ SOLN
INTRAMUSCULAR | Status: AC
  Filled 2014-02-08: qty 2

## 2014-02-08 MED ORDER — ACETAMINOPHEN 325 MG PO TABS: 325.0000 mg | ORAL_TABLET | Freq: Four times a day (QID) | ORAL | Status: DC | PRN

## 2014-02-08 MED ORDER — MENTHOL 3 MG MT LOZG: 1.0000 | LOZENGE | OROMUCOSAL | Status: DC | PRN

## 2014-02-08 MED ORDER — LIDOCAINE HCL (CARDIAC) 20 MG/ML IV SOLN
INTRAVENOUS | Status: DC | PRN
  Administered 2014-02-08: 50 mg via INTRAVENOUS

## 2014-02-08 MED ORDER — CHLORHEXIDINE GLUCONATE 4 % EX LIQD: 1.0000 "application " | Freq: Once | CUTANEOUS | Status: DC

## 2014-02-08 MED ORDER — OXYCODONE HCL 5 MG PO TABS: 5.0000 mg | ORAL_TABLET | Freq: Four times a day (QID) | ORAL | Status: DC | PRN

## 2014-02-08 MED ORDER — SUCCINYLCHOLINE CHLORIDE 20 MG/ML IJ SOLN
INTRAMUSCULAR | Status: DC | PRN
  Administered 2014-02-08: 120 mg via INTRAVENOUS

## 2014-02-08 MED ORDER — CARVEDILOL 3.125 MG PO TABS
ORAL_TABLET | ORAL | Status: AC
  Filled 2014-02-08: qty 1

## 2014-02-08 MED ORDER — PROPOFOL 10 MG/ML IV BOLUS
INTRAVENOUS | Status: AC
  Filled 2014-02-08: qty 20

## 2014-02-08 MED ORDER — ACETAMINOPHEN 650 MG RE SUPP: 650.0000 mg | Freq: Four times a day (QID) | RECTAL | Status: DC | PRN

## 2014-02-08 MED ORDER — OXYCODONE HCL 5 MG PO TABS: 5.0000 mg | ORAL_TABLET | ORAL | Status: DC | PRN

## 2014-02-08 MED ORDER — MIDAZOLAM HCL 2 MG/2ML IJ SOLN
INTRAMUSCULAR | Status: AC
  Filled 2014-02-08: qty 2

## 2014-02-08 MED ORDER — FENTANYL CITRATE 0.05 MG/ML IJ SOLN: 25.0000 ug | INTRAMUSCULAR | Status: DC | PRN

## 2014-02-08 MED ORDER — LIP MEDEX EX OINT: 1.0000 "application " | TOPICAL_OINTMENT | Freq: Two times a day (BID) | CUTANEOUS | Status: DC

## 2014-02-08 MED ORDER — 0.9 % SODIUM CHLORIDE (POUR BTL) OPTIME
TOPICAL | Status: DC | PRN
  Administered 2014-02-08: 1000 mL

## 2014-02-08 MED ORDER — PROMETHAZINE HCL 25 MG/ML IJ SOLN: 6.2500 mg | INTRAMUSCULAR | Status: DC | PRN

## 2014-02-08 MED ORDER — EPHEDRINE SULFATE 50 MG/ML IJ SOLN
INTRAMUSCULAR | Status: DC | PRN
  Administered 2014-02-08: 10 mg via INTRAVENOUS

## 2014-02-08 MED ORDER — SUCCINYLCHOLINE CHLORIDE 20 MG/ML IJ SOLN
INTRAMUSCULAR | Status: AC
  Filled 2014-02-08: qty 1

## 2014-02-08 MED ORDER — SODIUM CHLORIDE 0.9 % IV SOLN
Freq: Once | INTRAVENOUS | Status: AC
  Administered 2014-02-08 (×2): via INTRAVENOUS

## 2014-02-08 MED ORDER — ONDANSETRON HCL 4 MG/2ML IJ SOLN
INTRAMUSCULAR | Status: DC | PRN
  Administered 2014-02-08: 4 mg via INTRAVENOUS

## 2014-02-08 MED ORDER — PROPOFOL 10 MG/ML IV BOLUS
INTRAVENOUS | Status: DC | PRN
  Administered 2014-02-08: 30 mg via INTRAVENOUS
  Administered 2014-02-08 (×2): 50 mg via INTRAVENOUS
  Administered 2014-02-08: 120 mg via INTRAVENOUS

## 2014-02-08 MED ORDER — MAGIC MOUTHWASH: 15.0000 mL | Freq: Four times a day (QID) | ORAL | Status: DC | PRN

## 2014-02-08 MED ORDER — HYDROMORPHONE HCL PF 1 MG/ML IJ SOLN: 0.5000 mg | INTRAMUSCULAR | Status: DC | PRN

## 2014-02-08 MED ORDER — MIDAZOLAM HCL 5 MG/5ML IJ SOLN
INTRAMUSCULAR | Status: DC | PRN
  Administered 2014-02-08: 2 mg via INTRAVENOUS

## 2014-02-08 MED ORDER — FENTANYL CITRATE 0.05 MG/ML IJ SOLN
INTRAMUSCULAR | Status: DC | PRN
  Administered 2014-02-08 (×2): 100 ug via INTRAVENOUS
  Administered 2014-02-08: 50 ug via INTRAVENOUS

## 2014-02-08 MED ORDER — CARVEDILOL 3.125 MG PO TABS
3.1250 mg | ORAL_TABLET | Freq: Once | ORAL | Status: AC
  Administered 2014-02-08: 3.125 mg via ORAL

## 2014-02-08 SURGICAL SUPPLY — 44 items
BLADE SURG ROTATE 9660 (MISCELLANEOUS) ×3 IMPLANT
CANISTER SUCTION 2500CC (MISCELLANEOUS) IMPLANT
CHLORAPREP W/TINT 26ML (MISCELLANEOUS) ×3 IMPLANT
COVER SURGICAL LIGHT HANDLE (MISCELLANEOUS) ×3 IMPLANT
DECANTER SPIKE VIAL GLASS SM (MISCELLANEOUS) ×3 IMPLANT
DRAPE LAPAROTOMY T 102X78X121 (DRAPES) ×3 IMPLANT
DRSG TEGADERM 4X4.75 (GAUZE/BANDAGES/DRESSINGS) ×9 IMPLANT
ELECT CAUTERY BLADE 6.4 (BLADE) ×3 IMPLANT
ELECT REM PT RETURN 9FT ADLT (ELECTROSURGICAL) ×3
ELECTRODE REM PT RTRN 9FT ADLT (ELECTROSURGICAL) ×1 IMPLANT
GAUZE SPONGE 2X2 8PLY STRL LF (GAUZE/BANDAGES/DRESSINGS) ×3 IMPLANT
GAUZE SPONGE 4X4 16PLY XRAY LF (GAUZE/BANDAGES/DRESSINGS) ×3 IMPLANT
GLOVE BIO SURGEON STRL SZ 6.5 (GLOVE) ×2 IMPLANT
GLOVE BIO SURGEONS STRL SZ 6.5 (GLOVE) ×1
GLOVE BIOGEL PI IND STRL 7.0 (GLOVE) ×2 IMPLANT
GLOVE BIOGEL PI IND STRL 8 (GLOVE) ×1 IMPLANT
GLOVE BIOGEL PI INDICATOR 7.0 (GLOVE) ×4
GLOVE BIOGEL PI INDICATOR 8 (GLOVE) ×2
GLOVE ECLIPSE 8.0 STRL XLNG CF (GLOVE) ×3 IMPLANT
GLOVE SURG SS PI 7.0 STRL IVOR (GLOVE) ×3 IMPLANT
GOWN STRL REUS W/ TWL LRG LVL3 (GOWN DISPOSABLE) ×2 IMPLANT
GOWN STRL REUS W/ TWL XL LVL3 (GOWN DISPOSABLE) ×1 IMPLANT
GOWN STRL REUS W/TWL LRG LVL3 (GOWN DISPOSABLE) ×4
GOWN STRL REUS W/TWL XL LVL3 (GOWN DISPOSABLE) ×2
KIT BASIN OR (CUSTOM PROCEDURE TRAY) ×3 IMPLANT
KIT ROOM TURNOVER OR (KITS) ×3 IMPLANT
NEEDLE 22X1 1/2 (OR ONLY) (NEEDLE) ×3 IMPLANT
NS IRRIG 1000ML POUR BTL (IV SOLUTION) ×3 IMPLANT
PACK SURGICAL SETUP 50X90 (CUSTOM PROCEDURE TRAY) ×3 IMPLANT
PAD ARMBOARD 7.5X6 YLW CONV (MISCELLANEOUS) ×6 IMPLANT
PENCIL BUTTON HOLSTER BLD 10FT (ELECTRODE) ×3 IMPLANT
SPONGE GAUZE 2X2 STER 10/PKG (GAUZE/BANDAGES/DRESSINGS) ×6
SUT MNCRL AB 4-0 PS2 18 (SUTURE) ×3 IMPLANT
SUT VICRYL 0 UR6 27IN ABS (SUTURE) ×3 IMPLANT
SWAB COLLECTION DEVICE MRSA (MISCELLANEOUS) IMPLANT
SYR BULB 3OZ (MISCELLANEOUS) ×3 IMPLANT
SYR CONTROL 10ML LL (SYRINGE) ×3 IMPLANT
TOWEL OR 17X24 6PK STRL BLUE (TOWEL DISPOSABLE) ×3 IMPLANT
TOWEL OR 17X26 10 PK STRL BLUE (TOWEL DISPOSABLE) ×3 IMPLANT
TUBE ANAEROBIC SPECIMEN COL (MISCELLANEOUS) IMPLANT
TUBE CONNECTING 12'X1/4 (SUCTIONS)
TUBE CONNECTING 12X1/4 (SUCTIONS) IMPLANT
WATER STERILE IRR 1000ML POUR (IV SOLUTION) IMPLANT
YANKAUER SUCT BULB TIP NO VENT (SUCTIONS) IMPLANT

## 2014-02-08 NOTE — Anesthesia Postprocedure Evaluation (Signed)
  Anesthesia Post-op Note  Patient: Mario Woods  Procedure(s) Performed: Procedure(s): REMOVAL OF PERITONEAL DIALYSIS CATHETER (N/A)  Patient Location: PACU  Anesthesia Type:General  Level of Consciousness: awake, alert  and oriented  Airway and Oxygen Therapy: Patient Spontanous Breathing and Patient connected to nasal cannula oxygen  Post-op Pain: mild  Post-op Assessment: Post-op Vital signs reviewed, Patient's Cardiovascular Status Stable and Respiratory Function Stable  Post-op Vital Signs: Reviewed and stable  Last Vitals:  Filed Vitals:   02/08/14 1600  BP: 128/84  Pulse: 75  Temp:   Resp: 15    Complications: No apparent anesthesia complications

## 2014-02-08 NOTE — Op Note (Signed)
02/08/2014  3:29 PM  PATIENT:  Mario Woods  53 y.o. male  Patient Care Team: Robyn Haber, MD as PCP - General (Family Medicine) Lucrezia Starch, MD as Consulting Physician (Nephrology) [name], MD as Consulting Physician (General Surgery)  PRE-OPERATIVE DIAGNOSIS:  Intolerance to CAPD dialysis  POST-OPERATIVE DIAGNOSIS:  Intolerance to CAPD dialysis  PROCEDURE:  Procedure(s): REMOVAL OF PERITONEAL DIALYSIS CATHETER  SURGEON:  Surgeon(s): Adin Hector, MD  ASSISTANT RN   ANESTHESIA:   local and general  EBL:     Delay start of Pharmacological VTE agent (>24hrs) due to surgical blood loss or risk of bleeding:  no  DRAINS: none   SPECIMEN:  Source of Specimen:  CAPD catheter (Not sent)  DISPOSITION OF SPECIMEN:  (Not sent)  COUNTS:  YES  PLAN OF CARE: Discharge to home after PACU  PATIENT DISPOSITION:  PACU - hemodynamically stable.   INDICATION: Pleasant patient with chronic renal failure. Progressed to the point of needing dialysis. Had a CAPD peritoneal dialysis catheter placed in the past.  Had it revised.  Functioned well.  However needing more exchanges than desired & had dissatisfaction with its use.  Transitioned back to hemodialysis.  Wish to have it removed to avoid long-term infection risk.     Therefore, recommendation was made for removal. I did discuss procedure with the patient:   The anatomy & physiology of peritoneum was discussed. Natural history risks without surgery of worsening renal failure was discussed. I feel the risks of no intervention will lead to serious problems that outweigh the operative risks; therefore, I recommended repositioning / replaceement of a peritoneal dialysis catheter. I explained laparoscopic techniques with possible need for an open approach. Probable omentopexy.  Risks such as bleeding, infection, abscess, injury to other organs, reoperation, heart attack, death, and other risks were discussed. I noted a good  likelihood this will help address the problem. Need for further treatment such as hemodialysis was explained. Goals of post-operative recovery were discussed as well. We will work to minimize complications. Questions were answered. The patient expresses understanding & wishes to proceed with surgery.   OR FINDINGS:  Intact cuffs.  No evidence of cellulitis or abscess in subcutaneous tissues or in the deeper abdominal wall   DESCRIPTION:  Informed consent was confirmed. The patient received IV antibiotics. The patient underwent general anesthesia without any difficulty. The patient was positioned supine.  SCDs were active during the entire case. The abdomen was prepped and draped in a sterile fashion. A surgical timeout confirmed our plan.   I made a transverse incision over the left upper quadrant tunnel port site.  I extended the incision.  I located the 2 proximal cuffs and carefully freed them off using focused cautery and sharp dissection.  I trimmed it & removed the superficial catheter from its exit on the skin.  I excised the exit site skin did dissection to find the middle part of the catheter and followed down to its entry point the fascia in the left lower quadrant.  Because of his morbid obesity, I had to do an extra incision in the left suprapubic region.  I entered in the subcutaneous tissues. I located the peritoneal dialysis catheter in the deep SQ space.  I freed the catheter is attachments to the abdominal wall and came down to the anterior rectus fascia and opened that.  I freed around the intermuscular deep cuff and removed the catheter and its intraperitoneal component intact.  I inspected the wounds  and noted good hemostasis. I closed the the catheter exit site in the LLQ anterior rectus fascia using 0 vicryl interrupted. I closed skin wounds using 4 Monocryl stitch in running fashion. Sterile dressing applied. Patient is being extubated.   I discussed postop care in detail the  patient in the holding area. Instructions are written. I am about to discuss with family as well.

## 2014-02-08 NOTE — Transfer of Care (Signed)
Immediate Anesthesia Transfer of Care Note  Patient: Mario Woods  Procedure(s) Performed: Procedure(s): REMOVAL OF PERITONEAL DIALYSIS CATHETER (N/A)  Patient Location: PACU  Anesthesia Type:General  Level of Consciousness: awake, alert  and oriented  Airway & Oxygen Therapy: Patient Spontanous Breathing and Patient connected to nasal cannula oxygen  Post-op Assessment: Report given to PACU RN and Post -op Vital signs reviewed and stable  Post vital signs: Reviewed and stable  Complications: No apparent anesthesia complications

## 2014-02-08 NOTE — Interval H&P Note (Signed)
History and Physical Interval Note:  02/08/2014 2:13 PM  Mario Woods  has presented today for surgery, with the diagnosis of Intolerance to CAPD dialysis  The various methods of treatment have been discussed with the patient and family. After consideration of risks, benefits and other options for treatment, the patient has consented to  Procedure(s): REMOVAL OF PERITONEAL DIALYSIS CATHETER (N/A) as a surgical intervention .  The patient's history has been reviewed, patient examined, no change in status, stable for surgery.  I have reviewed the patient's chart and labs.  Questions were answered to the patient's satisfaction.     Yates Weisgerber C.

## 2014-02-08 NOTE — Anesthesia Procedure Notes (Signed)
Procedure Name: Intubation Date/Time: 02/08/2014 2:33 PM Performed by: Manuela Schwartz B Pre-anesthesia Checklist: Patient identified, Emergency Drugs available, Suction available, Patient being monitored and Timeout performed Patient Re-evaluated:Patient Re-evaluated prior to inductionOxygen Delivery Method: Circle system utilized Preoxygenation: Pre-oxygenation with 100% oxygen Intubation Type: IV induction and Rapid sequence Laryngoscope Size: Mac and 4 Grade View: Grade I Tube type: Oral Tube size: 7.5 mm Number of attempts: 1 Airway Equipment and Method: Stylet Placement Confirmation: ETT inserted through vocal cords under direct vision,  positive ETCO2 and breath sounds checked- equal and bilateral Secured at: 23 cm Tube secured with: Tape Dental Injury: Teeth and Oropharynx as per pre-operative assessment

## 2014-02-08 NOTE — H&P (View-Only) (Signed)
Subjective:     Patient ID: Mario Woods, male   DOB: 12-05-1960, 53 y.o.   MRN: [id number]  HPI   Note: This dictation was prepared with Dragon/digital dictation along with [employer]. Any transcriptional errors that result from this process are unintentional.       Mario Woods  1960/11/11 [id number]  Patient Care Team: Robyn Haber, MD as PCP - General (Family Medicine) Lucrezia Starch, MD as Consulting Physician (Nephrology) [name], MD as Consulting Physician (General Surgery)  Procedure (Date: 08/03/2013):  POST-OPERATIVE DIAGNOSIS: END STAGE RENAL DISEASE/DIALYSIS DEPENDENT   PROCEDURE: Procedure(s):  LAPAROSCOPIC EXPLORATION OF THE ABDOMEN  INSERTION OF TUNNELED INTRAPERITONEAL CATHETER FOR DIALYSIS   SURGEON: Surgeon(s):  Adin Hector, MD  Procedure (Date: 09/06/2013):  POST-OPERATIVE DIAGNOSIS: end stage renal disease, dialysis dependent   PROCEDURE: Procedure(s):  DIAGNOSTIC LAPAROSCOPY WITH REPOSITIONING OF CONTINUOUS AMBULATORY PERITONEAL DIALYSIS (CAPD) CATHETER  SURGEON: Surgeon(s):  Adin Hector, MD  ASSISTANT: RN   Patient comes in today wishing to have his peritoneal dialysis catheter around.  Since it was repositioned, and has been working okay.  He could not tolerate the machine extractions as it was painful near the end of the round.  He needed 5 volume exchange doing it manually.  Became more burdensome then he thought.  He is back on hemodialysis qMWF through a left forearm AV fistula.  Therefore wished to have it removed.    Patient Active Problem List   Diagnosis Date Noted  . Achilles tendonitis 11/26/2013  . Peritoneal dialysis catheter dysfunction s/p lap repositioning 09/06/2013 09/01/2013  . Obesity (BMI 30-39.9) 06/29/2013  . CKD (chronic kidney disease) stage V requiring chronic dialysis 06/29/2013  . H/O primary IgA nephropathy 02/16/2013  . H/O kidney transplant 02/16/2013    Past Medical History    Diagnosis Date  . H/O kidney transplant     takes Cellcept and Prograf daily  . Gout due to renal impairment     history (04/28/2012)  . Hyperlipidemia     borderline but no meds  . Pneumonia     hx of;last time 2010  . History of bronchitis     unsure of how long ago  . Tingling     fingers r/t meds he takes  . History of gout     but no meds required  . Diarrhea     med  . Anemia   . History of blood transfusion     no abnormal reaction noted  . Kidney disease     M/W/F at [facility]  . Hypertension     but doesn't take any meds  . Sleep apnea     sleep study done 12+yrs ago but wasn't confirmed with having sleep apnea so no cpap  . Shortness of breath     With exertion    Past Surgical History  Procedure Laterality Date  . Kidney transplant  04/2009  . Av fistula placement      Left  . Incisional hernia repair  04/27/2012    Procedure: LAPAROSCOPIC INCISIONAL HERNIA;  Surgeon: [name], MD;  Location: [place];  Service: General;  Laterality: N/A;  . Insertion of mesh  04/27/2012    Procedure: INSERTION OF MESH;  Surgeon: [name], MD;  Location: [place];  Service: General;  Laterality: N/A;  . Hernia repair  01/2009    primary ventral hernia repair x2   . Appendectomy  01/2009  . Capd insertion N/A 08/03/2013  Procedure: LAPAROSCOPIC EXPLORATION OF THE ABDOMEN WITH PLACEMENT OF PERITONEAL DIALYSIS CATHETER;  Surgeon: Adin Hector, MD;  Location: [place];  Service: General;  Laterality: N/A;  . Capd insertion N/A 09/06/2013    Procedure: DIAGNOSTIC LAPAROSCOPY WITH  REPOSITIONING OF CONTINUOUS AMBULATORY PERITONEAL DIALYSIS  (CAPD) CATHETER ;  Surgeon: Adin Hector, MD;  Location: [place];  Service: General;  Laterality: N/A;    History   Social History  . Marital Status: Married    Spouse Name: N/A    Number of Children: N/A  . Years of Education: N/A   Occupational History  . [employer]   Social History Main Topics  . Smoking status:  Former Smoker -- 8 years    Types: Cigarettes  . Smokeless tobacco: Never Used     Comment: quit smoking about 22yrs ago  . Alcohol Use: Yes     Comment: minimal  . Drug Use: No  . Sexual Activity: Yes   Other Topics Concern  . Not on file   Social History Narrative  . No narrative on file    Family History  Problem Relation Age of Onset  . Pancreatic cancer      Current Outpatient Prescriptions  Medication Sig Dispense Refill  . aspirin 81 MG chewable tablet Chew 81 mg by mouth every morning.      . Multiple Vitamin (MULTIVITAMIN WITH MINERALS) TABS tablet Take 1 tablet by mouth daily.      . predniSONE (DELTASONE) 10 MG tablet Take 5 mg by mouth daily with breakfast.       . tacrolimus (PROGRAF) 1 MG capsule Take 3 mg by mouth 2 (two) times daily.        No current facility-administered medications for this visit.     Allergies  Allergen Reactions  . Tape Rash    Paper tape ok    BP 138/97  Pulse 95  Temp(Src) 98 F (36.7 C) (Oral)  Ht 6\' 5"  (1.956 m)  Wt 299 lb 9.6 oz (135.898 kg)  BMI 35.52 kg/m2  No results found.   Review of Systems  Constitutional: Negative for fever, chills and diaphoresis.  HENT: Negative for sore throat and trouble swallowing.   Eyes: Negative for photophobia and visual disturbance.  Respiratory: Negative for choking and shortness of breath.   Cardiovascular: Negative for chest pain and palpitations.  Gastrointestinal: Negative for nausea, vomiting, abdominal distention, anal bleeding and rectal pain.  Genitourinary: Negative for dysuria, urgency, difficulty urinating and testicular pain.  Musculoskeletal: Negative for arthralgias, gait problem, myalgias and neck pain.  Skin: Negative for color change and rash.  Neurological: Negative for dizziness, speech difficulty, weakness and numbness.  Hematological: Negative for adenopathy.  Psychiatric/Behavioral: Negative for hallucinations, confusion and agitation.       Objective:    Physical Exam  Constitutional: He is oriented to person, place, and time. He appears well-developed and well-nourished. No distress.  HENT:  Head: Normocephalic.  Mouth/Throat: Oropharynx is clear and moist. No oropharyngeal exudate.  Eyes: Conjunctivae and EOM are normal. Pupils are equal, round, and reactive to light. No scleral icterus.  Neck: Normal range of motion. No tracheal deviation present.  Cardiovascular: Normal rate, normal heart sounds and intact distal pulses.   Pulmonary/Chest: Effort normal. No respiratory distress.  Abdominal: Soft. He exhibits no distension. There is no tenderness. Hernia confirmed negative in the right inguinal area and confirmed negative in the left inguinal area.    Incisions clean with normal healing ridges.  No hernias  Musculoskeletal: Normal range of motion. He exhibits no tenderness.  Neurological: He is alert and oriented to person, place, and time. No cranial nerve deficit. He exhibits normal muscle tone. Coordination normal.  Skin: Skin is warm and dry. No rash noted. He is not diaphoretic.  Psychiatric: He has a normal mood and affect. His behavior is normal.       Assessment:     Intolerance to peritoneal dialysis with desire to remove peritoneal dialysis catheter     Plan:     Plan removal.  Because of the 3 cuffs , one subfascial,  this needs anesthesia and sedation to do this.  Plan on off dialysis today.  (Therefore Tuesday/Thursday):  The anatomy & physiology of peritoneum was discussed.  Natural history risks without surgery of worsening renal failure was discussed.   I feel the risks of no intervention will lead to serious problems that outweigh the operative risks; therefore, I recommended removal of the CAPD peritoneal dialysis catheter.  I techniques with probable open approach.   Risks such as bleeding, infection, abscess, injury to other organs, reoperation heart attack, death, and other risks were discussed.   I noted a good  likelihood this will help address the problem.  Possibility that this will not be enough to compensate for the renal failure & need for further treatment such as hemodialysis was explained.  Goals of post-operative recovery were discussed as well.  We will work to minimize complications.  Questions were answered.  The patient expresses understanding & wishes to proceed with surgery.

## 2014-02-08 NOTE — Discharge Instructions (Signed)
GENERAL SURGERY: POST OP INSTRUCTIONS ° °1. DIET: Follow a light bland diet the first 24 hours after arrival home, such as soup, liquids, crackers, etc.  Be sure to include lots of fluids daily.  Avoid fast food or heavy meals as your are more likely to get nauseated.   °2. Take your usually prescribed home medications unless otherwise directed. °3. PAIN CONTROL: °a. Pain is best controlled by a usual combination of three different methods TOGETHER: °i. Ice/Heat °ii. Over the counter pain medication °iii. Prescription pain medication °b. Most patients will experience some swelling and bruising around the incisions.  Ice packs or heating pads (30-60 minutes up to 6 times a day) will help. Use ice for the first few days to help decrease swelling and bruising, then switch to heat to help relax tight/sore spots and speed recovery.  Some people prefer to use ice alone, heat alone, alternating between ice & heat.  Experiment to what works for you.  Swelling and bruising can take several weeks to resolve.   °c. It is helpful to take an over-the-counter pain medication regularly for the first few weeks.  Choose one of the following that works best for you: °i. Naproxen (Aleve, etc)  Two 220mg tabs twice a day °ii. Ibuprofen (Advil, etc) Three 200mg tabs four times a day (every meal & bedtime) °iii. Acetaminophen (Tylenol, etc) 500-650mg four times a day (every meal & bedtime) °d. A  prescription for pain medication (such as oxycodone, hydrocodone, etc) should be given to you upon discharge.  Take your pain medication as prescribed.  °i. If you are having problems/concerns with the prescription medicine (does not control pain, nausea, vomiting, rash, itching, etc), please call us [phone] to see if we need to switch you to a different pain medicine that will work better for you and/or control your side effect better. °ii. If you need a refill on your pain medication, please contact your pharmacy.  They will contact our  office to request authorization. Prescriptions will not be filled after 5 pm or on week-ends. °4. Avoid getting constipated.  Between the surgery and the pain medications, it is common to experience some constipation.  Increasing fluid intake and taking a fiber supplement (such as Metamucil, Citrucel, FiberCon, MiraLax, etc) 1-2 times a day regularly will usually help prevent this problem from occurring.  A mild laxative (prune juice, Milk of Magnesia, MiraLax, etc) should be taken according to package directions if there are no bowel movements after 48 hours.   °5. Wash / shower every day.  You may shower over the dressings as they are waterproof.  Continue to shower over incision(s) after the dressing is off. °6. Remove your waterproof bandages 5 days after surgery.  You may leave the incision open to air.  You may have skin tapes (Steri Strips) covering the incision(s).  Leave them on until one week, then remove.  You may replace a dressing/Band-Aid to cover the incision for comfort if you wish.  ° ° ° ° °7. ACTIVITIES as tolerated:   °a. You may resume regular (light) daily activities beginning the next day--such as daily self-care, walking, climbing stairs--gradually increasing activities as tolerated.  If you can walk 30 minutes without difficulty, it is safe to try more intense activity such as jogging, treadmill, bicycling, low-impact aerobics, swimming, etc. °b. Save the most intensive and strenuous activity for last such as sit-ups, heavy lifting, contact sports, etc  Refrain from any heavy lifting or straining until you   are off narcotics for pain control.   c. DO NOT PUSH THROUGH PAIN.  Let pain be your guide: If it hurts to do something, don't do it.  Pain is your body warning you to avoid that activity for another week until the pain goes down. d. You may drive when you are no longer taking prescription pain medication, you can comfortably wear a seatbelt, and you can safely maneuver your car and  apply brakes. e. Dennis Bast may have sexual intercourse when it is comfortable.  8. FOLLOW UP in our office a. Please call [facility] at [phone] to set up an appointment to see your surgeon in the office for a follow-up appointment approximately 2-3 weeks after your surgery. b. Make sure that you call for this appointment the day you arrive home to insure a convenient appointment time. 9. IF YOU HAVE DISABILITY OR FAMILY LEAVE FORMS, BRING THEM TO THE OFFICE FOR PROCESSING.  DO NOT GIVE THEM TO YOUR DOCTOR.   WHEN TO CALL us [phone]: 1. Poor pain control 2. Reactions / problems with new medications (rash/itching, nausea, etc)  3. Fever over 101.5 F (38.5 C) 4. Worsening swelling or bruising 5. Continued bleeding from incision. 6. Increased pain, redness, or drainage from the incision 7. Difficulty breathing / swallowing   The clinic staff is available to answer your questions during regular business hours (8:30am-5pm).  Please dont hesitate to call and ask to speak to one of our nurses for clinical concerns.   If you have a medical emergency, go to the nearest emergency room or call 911.  A surgeon from [facility] is always on call at the [facility], [place], [place], [place], [place]  [address] ? MAIN: [phone] ? TOLL FREE: [phone] ?  FAX [phone] [email or web address]  Managing Pain  Pain after surgery or related to activity is often due to strain/injury to muscle, tendon, nerves and/or incisions.  This pain is usually short-term and will improve in a few months.   Many people find it helpful to do the following things TOGETHER to help speed the process of healing and to get back to regular activity more quickly:  1. Avoid heavy physical activity a.  no lifting greater than 20 pounds b. Do not push through the pain.  Listen to your body and avoid positions and maneuvers than reproduce the  pain c. Walking is okay as tolerated, but go slowly and stop when getting sore.  d. Remember: If it hurts to do it, then dont do it! 2. Take Anti-inflammatory medication  a. Take with food/snack around the clock for 1-2 weeks i. This helps the muscle and nerve tissues become less irritable and calm down faster b. Choose ONE of the following over-the-counter medications: i. Naproxen 220mg  tabs (ex. Aleve) 1-2 pills twice a day  ii. Ibuprofen 200mg  tabs (ex. Advil, Motrin) 3-4 pills with every meal and just before bedtime iii. Acetaminophen 500mg  tabs (Tylenol) 1-2 pills with every meal and just before bedtime 3. Use a Heating pad or Ice/Cold Pack a. 4-6 times a day b. May use warm bath/hottub  or showers 4. Try Gentle Massage and/or Stretching  a. at the area of pain many times a day b. stop if you feel pain - do not overdo it  Try these steps together to help you body heal faster and avoid making things get worse.  Doing just one of these things may not be  enough.    If you are not getting better after two weeks or are noticing you are getting worse, contact our office for further advice; we may need to re-evaluate you & see what other things we can do to help.

## 2014-02-08 NOTE — Anesthesia Preprocedure Evaluation (Addendum)
Anesthesia Evaluation  Patient identified by MRN, date of birth, ID band Patient awake    Reviewed: Allergy & Precautions, H&P , NPO status , Patient's Chart, lab work & pertinent test results, reviewed documented beta blocker date and time   History of Anesthesia Complications Negative for: history of anesthetic complications  Airway Mallampati: I TM Distance: >3 FB Neck ROM: Full    Dental  (+) Teeth Intact   Pulmonary sleep apnea , former smoker,          Cardiovascular hypertension, Pt. on home beta blockers     Neuro/Psych    GI/Hepatic   Endo/Other    Renal/GU ESRF and DialysisRenal disease     Musculoskeletal   Abdominal   Peds  Hematology  (+) anemia ,   Anesthesia Other Findings   Reproductive/Obstetrics                         Anesthesia Physical Anesthesia Plan  ASA: III  Anesthesia Plan: Regional   Post-op Pain Management:    Induction: Intravenous  Airway Management Planned: Oral ETT  Additional Equipment:   Intra-op Plan:   Post-operative Plan: Extubation in OR  Informed Consent: I have reviewed the patients History and Physical, chart, labs and discussed the procedure including the risks, benefits and alternatives for the proposed anesthesia with the patient or authorized representative who has indicated his/her understanding and acceptance.   Dental advisory given  Plan Discussed with: Anesthesiologist and Surgeon  Anesthesia Plan Comments:         Anesthesia Quick Evaluation

## 2014-02-10 ENCOUNTER — Encounter (HOSPITAL_COMMUNITY): Payer: Self-pay | Admitting: Surgery

## 2014-02-11 NOTE — Addendum Note (Signed)
Addendum created 02/11/14 0944 by Finis Bud, MD   Modules edited: Anesthesia Responsible Staff

## 2014-05-04 ENCOUNTER — Encounter: Payer: Self-pay | Admitting: Gastroenterology

## 2014-07-14 ENCOUNTER — Encounter: Payer: Medicare Other | Admitting: Gastroenterology

## 2014-08-04 ENCOUNTER — Other Ambulatory Visit: Payer: Self-pay | Admitting: Nephrology

## 2014-08-04 ENCOUNTER — Ambulatory Visit
Admission: RE | Admit: 2014-08-04 | Discharge: 2014-08-04 | Disposition: A | Discharge status: Home / Self Care | Payer: Medicare Other | Source: Ambulatory Visit | Attending: Nephrology | Admitting: Nephrology

## 2014-08-04 DIAGNOSIS — R05 Cough: Secondary | ICD-10-CM

## 2014-08-04 DIAGNOSIS — R053 Chronic cough: Secondary | ICD-10-CM

## 2014-08-18 ENCOUNTER — Other Ambulatory Visit: Payer: Self-pay | Admitting: Nephrology

## 2014-08-31 ENCOUNTER — Encounter: Payer: Self-pay | Admitting: Internal Medicine

## 2014-08-31 ENCOUNTER — Ambulatory Visit (INDEPENDENT_AMBULATORY_CARE_PROVIDER_SITE_OTHER): Payer: 59 | Admitting: Internal Medicine

## 2014-08-31 VITALS — BP 114/80 | HR 90 | Ht 77.0 in | Wt 273.8 lb

## 2014-08-31 DIAGNOSIS — R059 Cough, unspecified: Secondary | ICD-10-CM | POA: Insufficient documentation

## 2014-08-31 DIAGNOSIS — R05 Cough: Secondary | ICD-10-CM

## 2014-08-31 NOTE — Progress Notes (Signed)
Subjective:    Patient ID: Mario Woods, male    DOB: 12-Jun-1960,   MRN: [id number]  HPI  45 yowm active light smoker with chronic cough onset  Nov 2015 referred 08/31/2014 to pulmonary by Mario Woods for eval of chronic cough   08/31/2014 1st [facility] office visit/ Mario Woods   Chief Complaint  Patient presents with  . Pulmonary Consult    Referred by Dr. Jamal Woods. Pt states that he has had cough for the past 4-5 months. He states that his cough is sometimes prod in the am with minimal white sputum. He states that he was dxed with PNA on 08/20/14 and was txed with ABX and cough has improved some.     onset was indolent, pattern was daily / persistent but  Cough almost gone now still has am sputum production / some nasal congestion but no over sinus dz. Has had some mild seasonal rhinitis in past rx with otc's but hasn't used any this illness.   No obvious day to day or daytime variabilty or assoc sob  or cp or chest tightness, subjective  wheeze or hb symptoms. No unusual exp hx or h/o childhood pna/ asthma or knowledge of premature birth.  Sleeping ok without nocturnal  or early am exacerbation  of respiratory  c/o's or need for noct saba. Also denies any obvious fluctuation of symptoms with weather or environmental changes or other aggravating or alleviating factors except as outlined above   Current Medications, Allergies, Complete Past Medical History, Past Surgical History, Family History, and Social History were reviewed in [employer] record.            Kouffman Reflux v Neurogenic Cough Differentiator Reflux Comments  Do you awaken from a sound sleep coughing violently?                            With trouble breathing? No   Do you have choking episodes when you cannot  Get enough air, gasping for air ?              NO   Do you usually cough when you lie down into  The bed, or when you just lie down to rest ?                          No    Do you  usually cough after meals or eating?         no   Do you cough when (or after) you bend over?        GERD SCORE     Kouffman Reflux v Neurogenic Cough Differentiator Neurogenic   Do you more-or-less cough all day long? no   Does change of temperature make you cough?    Does laughing or chuckling cause you to cough?    Do fumes (perfume, automobile fumes, burned  Toast, etc.,) cause you to cough ?      No    Does speaking, singing, or talking on the phone cause you to cough   ?                  Neurogenic/Airway score       Review of Systems  Constitutional: Negative for fever, chills, activity change, appetite change and unexpected weight change.  HENT: Positive for congestion. Negative for dental problem, postnasal drip, rhinorrhea, sneezing, sore throat, trouble swallowing  and voice change.   Eyes: Negative for visual disturbance.  Respiratory: Positive for cough. Negative for choking and shortness of breath.   Cardiovascular: Negative for chest pain and leg swelling.  Gastrointestinal: Negative for nausea, vomiting and abdominal pain.  Genitourinary: Negative for difficulty urinating.  Musculoskeletal: Negative for arthralgias.  Skin: Negative for rash.  Psychiatric/Behavioral: Negative for behavioral problems and confusion.       Objective:   Physical Exam  amb wm nad good voice texture  Wt Readings from Last 3 Encounters:  08/31/14 273 lb 12.8 oz (124.195 kg)  02/08/14 299 lb (135.626 kg)  01/31/14 299 lb 9.6 oz (135.898 kg)    Vital signs reviewed  HEENT: nl dentition, turbinates, and orophanx. Nl external ear canals without cough reflex   NECK :  without JVD/Nodes/TM/ nl carotid upstrokes bilaterally   LUNGS: no acc muscle use, clear to A and P bilaterally without cough on insp or exp maneuvers   CV:  RRR  no s3 or murmur or increase in P2, no edema   ABD:  soft and nontender with nl excursion in the supine position. No bruits or organomegaly, bowel sounds  nl  MS:  warm without deformities, calf tenderness, cyanosis or clubbing  SKIN: warm and dry without lesions    NEURO:  alert, approp, no deficits    I personally reviewed images and agree with radiology impression as follows:  CXR:  08/04/14   Stable coarse interstitial lung markings which may reflect chronic mild interstitial edema or fibrosis. There is no acute cardiopulmonary abnormality.     Assessment & Plan:

## 2014-08-31 NOTE — Patient Instructions (Addendum)
For cough > ok to use mucinex dm up to 1200 mg every 12 hours as needed  For nasal drainage > zyrtec 10 mg at bedtime (works for 24 hours)  Please see patient coordinator before you leave today  to schedule sinus CT and we will call you the results  Call for follow up if worse cough or decrease ex tolerance

## 2014-09-01 NOTE — Assessment & Plan Note (Addendum)
The most common causes of chronic cough in immunocompetent adults include the following: upper airway cough syndrome (UACS), previously referred to as postnasal drip syndrome (PNDS), which is caused by variety of rhinosinus conditions; (2) asthma; (3) GERD; (4) chronic bronchitis from cigarette smoking or other inhaled environmental irritants; (5) nonasthmatic eosinophilic bronchitis; and (6) bronchiectasis.   These conditions, singly or in combination, have accounted for up to 94% of the causes of chronic cough in prospective studies.   Other conditions have constituted no >6% of the causes in prospective studies These have included bronchogenic carcinoma, chronic interstitial pneumonia, sarcoidosis, left ventricular failure, ACEI-induced cough, and aspiration from a condition associated with pharyngeal dysfunction.    Chronic cough is often simultaneously caused by more than one condition. A single cause has been found from 38 to 82% of the time, multiple causes from 18 to 62%. Multiply caused cough has been the result of three diseases up to 42% of the time.      The cough is now worst first thing in am typical of CB and chronic sinus dz  Rec sinus ct and trial of zyrtec but if not improved then CT chest (HRCT) looking for bronchiectasis or ILD plus trial of aggressive gerd rx  Also strongly rec  stop all smoking

## 2014-09-05 ENCOUNTER — Ambulatory Visit (INDEPENDENT_AMBULATORY_CARE_PROVIDER_SITE_OTHER)
Admission: RE | Admit: 2014-09-05 | Discharge: 2014-09-05 | Disposition: A | Discharge status: Home / Self Care | Payer: 59 | Source: Ambulatory Visit | Attending: Internal Medicine | Admitting: Internal Medicine

## 2014-09-05 DIAGNOSIS — R05 Cough: Secondary | ICD-10-CM | POA: Diagnosis not present

## 2014-09-06 ENCOUNTER — Inpatient Hospital Stay: Admission: RE | Admit: 2014-09-06 | Payer: Medicare Other | Source: Ambulatory Visit

## 2014-09-12 NOTE — Progress Notes (Signed)
Quick Note:  LMTCB ______ 

## 2014-09-13 ENCOUNTER — Other Ambulatory Visit: Payer: Self-pay | Admitting: Internal Medicine

## 2014-09-13 DIAGNOSIS — J342 Deviated nasal septum: Secondary | ICD-10-CM

## 2014-09-13 NOTE — Progress Notes (Signed)
Quick Note:  Spoke with pt and notified of results per Dr. Melvyn Novas. Pt verbalized understanding and denied any questions. Referral made per pt request ______

## 2014-09-30 ENCOUNTER — Encounter: Payer: Self-pay | Admitting: Nephrology

## 2014-10-31 ENCOUNTER — Encounter: Payer: Self-pay | Admitting: Internal Medicine

## 2014-11-15 ENCOUNTER — Ambulatory Visit (INDEPENDENT_AMBULATORY_CARE_PROVIDER_SITE_OTHER): Payer: 59 | Admitting: Neurology

## 2014-11-15 ENCOUNTER — Encounter: Payer: Self-pay | Admitting: Neurology

## 2014-11-15 VITALS — BP 150/101 | HR 78 | Temp 97.0°F | Ht 77.0 in | Wt 269.0 lb

## 2014-11-15 DIAGNOSIS — G609 Hereditary and idiopathic neuropathy, unspecified: Secondary | ICD-10-CM

## 2014-11-15 DIAGNOSIS — N184 Chronic kidney disease, stage 4 (severe): Secondary | ICD-10-CM | POA: Diagnosis not present

## 2014-11-15 DIAGNOSIS — E519 Thiamine deficiency, unspecified: Secondary | ICD-10-CM

## 2014-11-15 DIAGNOSIS — R7302 Impaired glucose tolerance (oral): Secondary | ICD-10-CM | POA: Diagnosis not present

## 2014-11-15 DIAGNOSIS — E538 Deficiency of other specified B group vitamins: Secondary | ICD-10-CM | POA: Diagnosis not present

## 2014-11-15 DIAGNOSIS — I10 Essential (primary) hypertension: Secondary | ICD-10-CM

## 2014-11-15 DIAGNOSIS — E531 Pyridoxine deficiency: Secondary | ICD-10-CM | POA: Diagnosis not present

## 2014-11-15 NOTE — Patient Instructions (Addendum)
Overall you are doing fairly well but I do want to suggest a few things today:   Remember to drink plenty of fluid, eat healthy meals and do not skip any meals. Try to eat protein with a every meal and eat a healthy snack such as fruit or nuts in between meals. Try to keep a regular sleep-wake schedule and try to exercise daily, particularly in the form of walking, 20-30 minutes a day, if you can.   As far as diagnostic testing: labs  I would like to see you back in 4-6 months, sooner if we need to. Please call us with any interim questions, concerns, problems, updates or refill requests.   Please also call us for any test results so we can go over those with you on the phone.  My clinical assistant and will answer any of your questions and relay your messages to me and also relay most of my messages to you.   Our phone number is [phone]. We also have an after hours call service for urgent matters and there is a physician on-call for urgent questions. For any emergencies you know to call 911 or go to the nearest emergency room

## 2014-11-15 NOTE — Progress Notes (Signed)
[facility]    Provider:  Dr Jaynee Eagles Referring Provider: Robyn Haber, MD Primary Care Physician:  Robyn Haber, MD  CC:  numbness  HPI:  Mario Woods is a 54 y.o. male here as a referral from Dr. Joseph Art for numbness in the balls of his feet and the toes. 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 Started 4-6 months ago. He first noticed it when he felt cold in the winter. Symptoms are continuous. He notices it more now, doesn't prevent him from doing anything, his feet are starting to hurt more when exercising, has cramps in his feet. Balance is poor but no falls, he feels less balanced. He has to focus more on walking than he used to. No low back pain or radicular symptoms. Symptoms are symmetric in the feet and the hands or not affected. His left buttocks and back of the thigh are numb which is chronic from teenage years. No bowel or bladder issues.   Review of Systems: Patient complains of symptoms per HPI as well as the following symptoms: fatigue, numbness, weakness, restless legs, not enough sleep, decreased energy, disinterest in activities. Pertinent negatives per HPI. All others negative.   History   Social History  . Marital Status: Married    Spouse Name: Katy Apo  . Number of Children: 5  . Years of Education: BS   Occupational History  . [employer]    Social History Main Topics  . Smoking status: Current Some Day Smoker -- 0.25 packs/day for 20 years    Types: Cigarettes  . Smokeless tobacco: Never Used  . Alcohol Use: 0.0 oz/week    0 Standard drinks or equivalent per week     Comment: minimal  . Drug Use: No  . Sexual Activity: Yes    Birth Control/ Protection: Inserts   Other Topics Concern  . Not on file   Social History Narrative   Lives at home with wife and children.   Caffeine use: occassional     Family History  Problem Relation Age of Onset  . Pancreatic cancer Maternal Grandmother     Past  Medical History  Diagnosis Date  . H/O kidney transplant     takes Cellcept and Prograf daily  . Gout due to renal impairment     history (04/28/2012)  . Hyperlipidemia     borderline but no meds  . Pneumonia     hx of;last time 2010  . History of bronchitis     unsure of how long ago  . Tingling     fingers r/t meds he takes  . History of gout     but no meds required  . Diarrhea     med  . Anemia   . History of blood transfusion     no abnormal reaction noted  . Kidney disease     M/W/F at [facility]  . Hypertension     but doesn't take any meds  . Sleep apnea     sleep study done 12+yrs ago but wasn't confirmed with having sleep apnea so no cpap  . Shortness of breath     With exertion  . Other back pain     Disc problems - age 41 y/o (1979)    Past Surgical History  Procedure Laterality Date  . Kidney transplant  04/2009  . Av fistula placement      Left  . Incisional hernia repair  04/27/2012    Procedure: LAPAROSCOPIC INCISIONAL  HERNIA;  Surgeon: [name], MD;  Location: [place];  Service: General;  Laterality: N/A;  . Insertion of mesh  04/27/2012    Procedure: INSERTION OF MESH;  Surgeon: [name], MD;  Location: [place];  Service: General;  Laterality: N/A;  . Hernia repair  01/2009    primary ventral hernia repair x2   . Appendectomy  01/2009  . Capd insertion N/A 08/03/2013    Procedure: LAPAROSCOPIC EXPLORATION OF THE ABDOMEN WITH PLACEMENT OF PERITONEAL DIALYSIS CATHETER;  Surgeon: Adin Hector, MD;  Location: [place];  Service: General;  Laterality: N/A;  . Capd insertion N/A 09/06/2013    Procedure: DIAGNOSTIC LAPAROSCOPY WITH  REPOSITIONING OF CONTINUOUS AMBULATORY PERITONEAL DIALYSIS  (CAPD) CATHETER ;  Surgeon: Adin Hector, MD;  Location: [place];  Service: General;  Laterality: N/A;  . Capd removal N/A 02/08/2014    Procedure: REMOVAL OF PERITONEAL DIALYSIS CATHETER;  Surgeon: Michael Boston, MD;  Location: [place];  Service: General;   Laterality: N/A;    Current Outpatient Prescriptions  Medication Sig Dispense Refill  . amLODipine (NORVASC) 10 MG tablet 1 tablet on non dialysis days  5  . calcium carbonate (OS-CAL) 600 MG TABS tablet Take 600 mg by mouth 3 (three) times daily with meals as needed.    . SENSIPAR 60 MG tablet Take 1 tablet by mouth daily.  6  . sevelamer carbonate (RENVELA) 800 MG tablet Take 2,400 mg by mouth 3 (three) times daily with meals.      No current facility-administered medications for this visit.    Allergies as of 11/15/2014 - Review Complete 08/31/2014  Allergen Reaction Noted  . Tape Rash 04/20/2012    Vitals: BP 150/101 mmHg  Pulse 78  Temp(Src) 97 F (36.1 C)  Ht 6\' 5"  (1.956 m)  Wt 269 lb (122.018 kg)  BMI 31.89 kg/m2 Last Weight:  Wt Readings from Last 1 Encounters:  11/15/14 269 lb (122.018 kg)   Last Height:   Ht Readings from Last 1 Encounters:  11/15/14 6\' 5"  (1.956 m)   Physical exam: Exam: Gen: NAD, conversant, well nourised, obese          CV: RRR, no MRG. No Carotid Bruits. No peripheral edema, warm, nontender Eyes: Conjunctivae clear without exudates or hemorrhage  Neuro: Detailed Neurologic Exam  Speech:    Speech is normal; fluent and spontaneous with normal comprehension.  Cognition:    The patient is oriented to person, place, and time;     recent and remote memory intact;     language fluent;     normal attention, concentration,     fund of knowledge Cranial Nerves:    The pupils are equal, round, and reactive to light. The fundi are normal and spontaneous venous pulsations are present. Visual fields are full to finger confrontation. Extraocular movements are intact. Trigeminal sensation is intact and the muscles of mastication are normal. The face is symmetric. The palate elevates in the midline. Hearing intact. Voice is normal. Shoulder shrug is normal. The tongue has normal motion without fasciculations.   Coordination:    Normal finger to  nose and heel to shin. Normal rapid alternating movements.   Gait:    Heel-toe and tandem gait are normal.   Motor Observation:    No asymmetry, no atrophy, and no involuntary movements noted. Tone:    Normal muscle tone.    Posture:    Posture is normal. normal erect    Strength:    Strength  is V/V in the upper and lower limbs.      Sensation: Decreased pinprick and temperature distally in the lower extremities to the ankles, absent vibration at the great toes, intact proprioception     Reflex Exam:  DTR's:    Deep tendon reflexes in the upper and lower extremities are normal bilaterally.   Toes:    The toes are downgoing bilaterally.   Clonus:    Clonus is absent.        Assessment/Plan:  54 year old patient with a past medical history of chronic kidney disease who has symmetric numbness in his feet, cramping and some imbalance. This is likely peripheral polyneuropathy. Exam shows large and small fiber nerve distal decrease in the feet. Strength is intact. Hands are unaffected. It is very common to have peripheral polyneuropathy due to chronic kidney disease. The best way to prevent progression is to be compliant with medications and dialysis. But will also perform a thorough serum evaluation for peripheral neuropathy for other etiologies.  Sarina Ill, MD  [facility] [address] [place], [place] [address]  Phone [phone] Fax [phone]

## 2014-11-17 ENCOUNTER — Telehealth: Payer: Self-pay | Admitting: *Deleted

## 2014-11-17 NOTE — Telephone Encounter (Signed)
Spoke with pt to let him know all labs normal and polyneuropathy likely secondary to chronic kidney disease as discussed with Dr. Jaynee Eagles at last appt. Pt verbalized understanding.

## 2014-11-21 LAB — GLIADIN ANTIBODIES, SERUM
Antigliadin Abs, IgA: 5 units (ref 0–19)
Gliadin IgG: 15 units (ref 0–19)

## 2014-11-21 LAB — VITAMIN B1: Thiamine: 165.8 nmol/L (ref 66.5–200.0)

## 2014-11-21 LAB — PAN-ANCA
ANCA Proteinase 3: <3.5 U/mL (ref 0.0–3.5)
Atypical pANCA: <1:20 {titer}
C-ANCA: <1:20 {titer}
Myeloperoxidase Ab: <9 U/mL (ref 0.0–9.0)

## 2014-11-21 LAB — CRYOGLOBULIN

## 2014-11-21 LAB — TSH: TSH: 0.973 u[IU]/mL (ref 0.450–4.500)

## 2014-11-21 LAB — B. BURGDORFI ANTIBODIES

## 2014-11-21 LAB — MULTIPLE MYELOMA PANEL, SERUM
ALBUMIN SERPL ELPH-MCNC: 3.8 g/dL (ref 3.2–5.6)
ALPHA2 GLOB SERPL ELPH-MCNC: 0.7 g/dL (ref 0.4–1.2)
Albumin/Glob SerPl: 1.2 (ref 0.7–2.0)
Alpha 1: 0.2 g/dL (ref 0.1–0.4)
B-GLOBULIN SERPL ELPH-MCNC: 1 g/dL (ref 0.6–1.3)
Gamma Glob SerPl Elph-Mcnc: 1.4 g/dL (ref 0.5–1.6)
Globulin, Total: 3.3 g/dL (ref 2.0–4.5)
IgA/Immunoglobulin A, Serum: 181 mg/dL (ref 90–386)
IgG (Immunoglobin G), Serum: 1360 mg/dL (ref 700–1600)
IgM (Immunoglobulin M), Srm: 143 mg/dL (ref 20–172)
Total Protein: 7.1 g/dL (ref 6.0–8.5)

## 2014-11-21 LAB — HEPATITIS C ANTIBODY: Hep C Virus Ab: <0.1 {s_co_ratio} (ref 0.0–0.9)

## 2014-11-21 LAB — ANGIOTENSIN CONVERTING ENZYME: Angio Convert Enzyme: 42 U/L (ref 14–82)

## 2014-11-21 LAB — HEAVY METALS, BLOOD
Arsenic: 7 ug/L (ref 2–23)
Lead, Blood: 3 ug/dL (ref 0–19)
Mercury: 1.2 ug/L (ref 0.0–14.9)

## 2014-11-21 LAB — HEMOGLOBIN A1C
Est. average glucose Bld gHb Est-mCnc: 103 mg/dL
Hgb A1c MFr Bld: 5.2 % (ref 4.8–5.6)

## 2014-11-21 LAB — COPPER, SERUM: COPPER: 119 ug/dL (ref 72–166)

## 2014-11-21 LAB — SJOGREN'S SYNDROME ANTIBODS(SSA + SSB): ENA SSA (RO) Ab: <0.2 AI (ref 0.0–0.9)

## 2014-11-21 LAB — B12 AND FOLATE PANEL
Folate: 7.7 ng/mL (ref >3.0)
Vitamin B-12: 335 pg/mL (ref 211–946)

## 2014-11-21 LAB — TISSUE TRANSGLUTAMINASE, IGA: Transglutaminase IgA: <2 U/mL (ref 0–3)

## 2014-11-21 LAB — VITAMIN B6: Vitamin B6: 4 ug/L — ABNORMAL LOW (ref 5.3–46.7)

## 2014-11-21 LAB — SYPHILIS: RPR W/REFLEX TO RPR TITER AND TREPONEMAL ANTIBODIES, TRADITIONAL SCREENING AND DIAGNOSIS ALGORITHM: RPR Ser Ql: NONREACTIVE

## 2014-11-21 LAB — HIV ANTIBODY (ROUTINE TESTING W REFLEX): HIV Screen 4th Generation wRfx: NONREACTIVE

## 2014-11-21 LAB — ANA W/REFLEX: Anti Nuclear Antibody(ANA): NEGATIVE

## 2014-11-22 ENCOUNTER — Telehealth: Payer: Self-pay | Admitting: *Deleted

## 2014-11-22 NOTE — Telephone Encounter (Signed)
Left VM to let pt know I was calling about results. Gave [facility] phone number and office hours.

## 2014-11-28 ENCOUNTER — Ambulatory Visit (AMBULATORY_SURGERY_CENTER): Payer: Self-pay | Admitting: *Deleted

## 2014-11-28 VITALS — Ht 77.0 in | Wt 269.0 lb

## 2014-11-28 DIAGNOSIS — Z1211 Encounter for screening for malignant neoplasm of colon: Secondary | ICD-10-CM

## 2014-11-28 MED ORDER — NA SULFATE-K SULFATE-MG SULF 17.5-3.13-1.6 GM/177ML PO SOLN: 1.0000 | Freq: Once | ORAL | Status: DC

## 2014-11-28 NOTE — Telephone Encounter (Signed)
Spoke with wife, Mario Woods about lab results. Told her I called and left message for pt last week. Told her  "B6 is a little low. The best way is to increase his intake of B-6 rich foods. Meats, whole grains, vegetables, and nuts are the best sources. Cooking, food processing, and storage can reduce vitamin B6 availability by 10 to 50 percent. Warned against taking OTC supplements as taking too much B6 can actually cause toxicity. Told her to let him know to adjust his diet and follow up with repeat testing in 6 months to a year with his pcp" Wife verbalized understanding. I told her to call back if she had any more questions.

## 2014-11-28 NOTE — Progress Notes (Signed)
No home 0 use No diet pills No egg or soy allergy No issues with past sedation emmi declined

## 2014-12-08 ENCOUNTER — Ambulatory Visit (AMBULATORY_SURGERY_CENTER): Payer: 59 | Admitting: Internal Medicine

## 2014-12-08 ENCOUNTER — Encounter: Payer: Self-pay | Admitting: Internal Medicine

## 2014-12-08 VITALS — BP 150/88 | HR 59 | Temp 97.7°F | Resp 20 | Ht 77.0 in | Wt 269.0 lb

## 2014-12-08 DIAGNOSIS — K635 Polyp of colon: Secondary | ICD-10-CM

## 2014-12-08 DIAGNOSIS — Z1211 Encounter for screening for malignant neoplasm of colon: Secondary | ICD-10-CM | POA: Diagnosis present

## 2014-12-08 DIAGNOSIS — D125 Benign neoplasm of sigmoid colon: Secondary | ICD-10-CM

## 2014-12-08 DIAGNOSIS — D123 Benign neoplasm of transverse colon: Secondary | ICD-10-CM | POA: Diagnosis not present

## 2014-12-08 MED ORDER — SODIUM CHLORIDE 0.9 % IV SOLN: 500.0000 mL | INTRAVENOUS | Status: DC

## 2014-12-08 NOTE — Op Note (Signed)
[place]  [address]. [place], [address]   COLONOSCOPY PROCEDURE REPORT  PATIENT: Mario Woods, Mario Woods  MR#: [id number] BIRTHDATE: 19-Jul-1960 , 53  yrs. old GENDER: male ENDOSCOPIST: Jerene Bears, MD REFERRED [id number] [name], M.D. PROCEDURE DATE:  12/08/2014 PROCEDURE:   Colonoscopy with snare polypectomy and Colonoscopy, screening First Screening Colonoscopy - Avg.  risk and is 50 yrs.  old or older Yes.  Prior Negative Screening - Now for repeat screening. N/A  History of Adenoma - Now for follow-up colonoscopy & has been > or = to 3 yrs.  N/A  Polyps removed today? Yes ASA CLASS:   Class III INDICATIONS:Screening for colonic neoplasia and Colorectal Neoplasm Risk Assessment for this procedure is average risk. MEDICATIONS: Monitored anesthesia care and Propofol 500 mg IV  DESCRIPTION OF PROCEDURE:   After the risks benefits and alternatives of the procedure were thoroughly explained, informed consent was obtained.  The digital rectal exam revealed no rectal mass.   The LB [id number]  endoscope was introduced through the anus and advanced to the ascending colon. No adverse events experienced.   The quality of the prep was (Suprep was used) fair. The instrument was then slowly withdrawn as the colon was fully examined. Estimated blood loss is zero unless otherwise noted in this procedure report.   COLON FINDINGS: Despite manual counter pressure and position change, the scope was  unable to be advanced into the cecum. Ileocecal valve was well seen, but the cecal base was not visualized today. A sessile polyp measuring 6 mm in size was found in the transverse colon.  A polypectomy was performed with a cold snare.  The resection was complete, the polyp tissue was completely retrieved and sent to histology.   A pedunculated polyp measuring 12 mm in size was found in the sigmoid colon.  A polypectomy was performed using snare cautery.  The resection was  complete, the polyp tissue was completely retrieved and sent to histology.   Two sessile polyps ranging from 5 to 31mm in size were found in the distal sigmoid colon.  Polypectomies were performed with a cold snare. The resection was complete, the polyp tissue was completely retrieved and sent to histology.   There was moderate diverticulosis noted in the transverse colon and left colon with associated tortuosity.  Retroflexed views revealed internal hemorrhoids.         The scope was withdrawn and the procedure completed.  COMPLICATIONS: There were no immediate complications.  ENDOSCOPIC IMPRESSION: 1.   Sessile polyp was found in the transverse colon; polypectomy was performed with a cold snare 2.   Pedunculated polyp was found in the sigmoid colon; polypectomy was performed using snare cautery 3.   Two sessile polyps ranging from 5 to 56mm in size were found in the distal sigmoid colon; polypectomies were performed with a cold snare 4.   There was moderate diverticulosis noted in the transverse colon and left colon 5.   Incomplete colonoscopy, cecum not visualized today due to tortuous and redundant colon  RECOMMENDATIONS: 1.  Await pathology results 2.  Hold Aspirin and all other NSAIDs for 2 weeks. 3.  High fiber diet 4.  Virtual colonoscopy versus barium enema recommended to visualize the cecum given incomplete colonoscopy today 5.  Timing of repeat colonoscopy will be determined by pathology findings. 6.  You will receive a letter within 1-2 weeks with the results of your biopsy as well as final recommendations.  Please call my office if  you have not received a letter after 3 weeks.  eSigned:  Jerene Bears, MD 12/08/2014 3:35 PM   cc: Robyn Haber, MD and The Patient   PATIENT NAME:  Mario Woods, Mario Woods MR#: [id number]

## 2014-12-08 NOTE — Progress Notes (Signed)
A/ox3 pleased with MAC, report to Celia RN 

## 2014-12-08 NOTE — Patient Instructions (Signed)
Discharge instructions given. Handouts on polyps and diverticulosis. Hold aspirin and all other NSAIDS for 2 weeks. Resume previous medications. YOU HAD AN ENDOSCOPIC PROCEDURE TODAY AT [facility]:   Refer to the procedure report that was given to you for any specific questions about what was found during the examination.  If the procedure report does not answer your questions, please call your gastroenterologist to clarify.  If you requested that your care partner not be given the details of your procedure findings, then the procedure report has been included in a sealed envelope for you to review at your convenience later.  YOU SHOULD EXPECT: Some feelings of bloating in the abdomen. Passage of more gas than usual.  Walking can help get rid of the air that was put into your GI tract during the procedure and reduce the bloating. If you had a lower endoscopy (such as a colonoscopy or flexible sigmoidoscopy) you may notice spotting of blood in your stool or on the toilet paper. If you underwent a bowel prep for your procedure, you may not have a normal bowel movement for a few days.  Please Note:  You might notice some irritation and congestion in your nose or some drainage.  This is from the oxygen used during your procedure.  There is no need for concern and it should clear up in a day or so.  SYMPTOMS TO REPORT IMMEDIATELY:   Following lower endoscopy (colonoscopy or flexible sigmoidoscopy):  Excessive amounts of blood in the stool  Significant tenderness or worsening of abdominal pains  Swelling of the abdomen that is new, acute  Fever of 100F or higher   For urgent or emergent issues, a gastroenterologist can be reached at any hour by calling [phone].   DIET: Your first meal following the procedure should be a small meal and then it is ok to progress to your normal diet. Heavy or fried foods are harder to digest and may make you feel nauseous or bloated.   Likewise, meals heavy in dairy and vegetables can increase bloating.  Drink plenty of fluids but you should avoid alcoholic beverages for 24 hours.  ACTIVITY:  You should plan to take it easy for the rest of today and you should NOT DRIVE or use heavy machinery until tomorrow (because of the sedation medicines used during the test).    FOLLOW UP: Our staff will call the number listed on your records the next business day following your procedure to check on you and address any questions or concerns that you may have regarding the information given to you following your procedure. If we do not reach you, we will leave a message.  However, if you are feeling well and you are not experiencing any problems, there is no need to return our call.  We will assume that you have returned to your regular daily activities without incident.  If any biopsies were taken you will be contacted by phone or by letter within the next 1-3 weeks.  Please call us at [phone] if you have not heard about the biopsies in 3 weeks.    SIGNATURES/CONFIDENTIALITY: You and/or your care partner have signed paperwork which will be entered into your electronic medical record.  These signatures attest to the fact that that the information above on your After Visit Summary has been reviewed and is understood.  Full responsibility of the confidentiality of this discharge information lies with you and/or your care-partner.

## 2014-12-08 NOTE — Progress Notes (Signed)
Fistula left arm ,no sicks or BP on left sign placed on stretcher ,bp cuff on leg

## 2014-12-08 NOTE — Progress Notes (Signed)
Called to room to assist during endoscopic procedure.  Patient ID and intended procedure confirmed with present staff. Received instructions for my participation in the procedure from the performing physician.  

## 2014-12-09 ENCOUNTER — Telehealth: Payer: Self-pay | Admitting: Emergency Medicine

## 2014-12-09 ENCOUNTER — Other Ambulatory Visit: Payer: Self-pay

## 2014-12-09 DIAGNOSIS — Q438 Other specified congenital malformations of intestine: Secondary | ICD-10-CM

## 2014-12-09 NOTE — Telephone Encounter (Signed)
  Follow up Call-  Call back number 12/08/2014  Post procedure Call Back phone  # [phone]  Permission to leave phone message Yes     Patient questions:  Do you have a fever, pain , or abdominal swelling? No. Pain Score  0 *  Have you tolerated food without any problems? Yes.    Have you been able to return to your normal activities? Yes.    Do you have any questions about your discharge instructions: Diet   No. Medications  No. Follow up visit  No.  Do you have questions or concerns about your Care? No.  Actions: * If pain score is 4 or above: No action needed, pain <4.

## 2014-12-15 ENCOUNTER — Encounter: Payer: Self-pay | Admitting: *Deleted

## 2014-12-15 ENCOUNTER — Encounter: Payer: Self-pay | Admitting: Internal Medicine

## 2014-12-15 NOTE — Telephone Encounter (Signed)
Opened in error 7/7/16js

## 2014-12-16 NOTE — Telephone Encounter (Signed)
Phone note opened in error.  

## 2014-12-21 ENCOUNTER — Telehealth: Payer: Self-pay | Admitting: Family Medicine

## 2014-12-21 ENCOUNTER — Other Ambulatory Visit: Payer: 59

## 2014-12-21 NOTE — Telephone Encounter (Signed)
Disability requires ov.

## 2014-12-21 NOTE — Telephone Encounter (Signed)
Patient dropped off a disability form to be completed by Dr. Joseph Art. The patient has not been seen here since 2014. He states that [name] is his PCP. Can this be completed or does he need an OV? This form is in the nurse's box at 102. [name] will be back in office on 12/24/14 and 12/25/14  [phone]

## 2014-12-22 NOTE — Telephone Encounter (Signed)
Left detailed message for pt to RTC to fill out paperwork.

## 2014-12-25 ENCOUNTER — Ambulatory Visit (INDEPENDENT_AMBULATORY_CARE_PROVIDER_SITE_OTHER): Payer: 59 | Admitting: Family Medicine

## 2014-12-25 VITALS — BP 144/86 | HR 72 | Temp 97.7°F | Resp 16 | Ht 76.0 in | Wt 281.4 lb

## 2014-12-25 DIAGNOSIS — N186 End stage renal disease: Secondary | ICD-10-CM

## 2014-12-25 DIAGNOSIS — Z992 Dependence on renal dialysis: Secondary | ICD-10-CM

## 2014-12-25 DIAGNOSIS — N184 Chronic kidney disease, stage 4 (severe): Secondary | ICD-10-CM | POA: Diagnosis not present

## 2014-12-25 NOTE — Progress Notes (Signed)
Patient ID: Mario Woods, male   DOB: 08-02-1960, 54 y.o.   MRN: [id number]   This chart was scribed for Robyn Haber, MD by [facility], medical scribe at [facility].The patient was seen in exam room 11 and the patient's care was started at 3:29 PM.  Patient ID: Mario Woods MRN: [id number], DOB: [birth year], 54 y.o. Date of Encounter: 12/25/2014  Primary Physician: Robyn Haber, MD  Chief Complaint:  Chief Complaint  Patient presents with  . Follow-up    paperwork Dr. Carlean Jews has for pt.    HPI:  Mario Woods is a 54 y.o. male who presents to [facility] for a follow up for a disability form update that has been requested. 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 He becomes very short winded with activity. 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  Past Medical History  Diagnosis Date  . H/O kidney transplant     takes Cellcept and Prograf daily-off as transplant failed   . Gout due to renal impairment     history (04/28/2012)  . Hyperlipidemia     borderline but no meds  . Pneumonia     hx of;last time 2010  . History of bronchitis     unsure of how long ago  . Tingling     fingers r/t meds he takes  . History of gout     but no meds required  . Diarrhea     med  . Anemia   . History of blood transfusion     no abnormal reaction noted  . Hypertension     but doesn't take any meds  . Shortness of breath     With exertion  . Other back pain     Disc problems - age 84 y/o (88)  . Chronic kidney disease     [facility]   . Dialysis AV fistula infection     left forearm  . Failed kidney transplant     started dialysis 02-2013 after failure    Home Meds: Prior to Admission medications   Medication Sig Start Date End Date Taking? Authorizing Provider  amLODipine (NORVASC) 10 MG tablet 1 tablet on non dialysis days 08/01/14  Yes Historical Provider, MD  calcium carbonate (OS-CAL) 600 MG  TABS tablet Take 600 mg by mouth 3 (three) times daily with meals as needed.   Yes Historical Provider, MD  SENSIPAR 60 MG tablet Take 1 tablet by mouth daily. 07/07/14  Yes Historical Provider, MD  sevelamer carbonate (RENVELA) 800 MG tablet Take 2,400 mg by mouth 3 (three) times daily with meals.    Yes Historical Provider, MD   Allergies:  Allergies  Allergen Reactions  . Tape Rash    Paper tape ok   History   Social History  . Marital Status: Married    Spouse Name: Katy Apo  . Number of Children: 5  . Years of Education: BS   Occupational History  . [employer]    Social History Main Topics  . Smoking status: Former Smoker -- 0.25 packs/day for 20 years    Types: Cigarettes  . Smokeless tobacco: Never Used     Comment: social smoker   . Alcohol Use: 0.0 oz/week    0 Standard drinks or equivalent per week     Comment: minimal-rare   . Drug Use: No  . Sexual Activity: Yes    Birth Control/ Protection:  Inserts   Other Topics Concern  . Not on file   Social History Narrative   Lives at home with wife and children.   Caffeine use: occassional     Review of Systems: Constitutional: negative for chills, fever, night sweats, weight changes, or fatigue  HEENT: negative for vision changes, hearing loss, congestion, rhinorrhea, ST, epistaxis, or sinus pressure Cardiovascular: negative for chest pain or palpitations Respiratory: negative for hemoptysis, wheezing, shortness of breath, or cough Abdominal: negative for abdominal pain, nausea, vomiting, diarrhea, or constipation Dermatological: negative for rash Neurologic: negative for headache, dizziness, or syncope All other systems reviewed and are otherwise negative with the exception to those above and in the HPI.  Physical Exam: Blood pressure 144/86, pulse 72, temperature 97.7 F (36.5 C), temperature source Oral, resp. rate 16, height 6\' 4"  (1.93 m), weight 281 lb 6.4 oz (127.642 kg), SpO2 98 %., Body mass index is  34.27 kg/(m^2). General: Well developed, well nourished, in no acute distress. Head: Normocephalic, atraumatic, eyes without discharge, sclera non-icteric, nares are without discharge. Bilateral auditory canals clear, TM's are without perforation, pearly grey and translucent with reflective cone of light bilaterally. Oral cavity moist, posterior pharynx without exudate, erythema, peritonsillar abscess, or post nasal drip.  Neck: Supple. No thyromegaly. Full ROM. No lymphadenopathy. Lungs: few expiratory wheezes Heart: RRR with S1 S2. No murmurs, rubs, or gallops appreciated. Abdomen: Soft, non-tender, non-distended with normoactive bowel sounds. No hepatomegaly. No rebound/guarding. No obvious abdominal masses. Msk:  Strength and tone normal for age. Extremities/Skin: Warm and dry. No clubbing or cyanosis. No edema. No rashes or suspicious lesions. Thrill in his left wrist. Neuro: Alert and oriented X 3. Moves all extremities spontaneously. Gait is normal. CNII-XII grossly in tact. Psych:  Responds to questions appropriately with a normal affect.   Disability forms filled out  ASSESSMENT AND PLAN:  54 y.o. year old male with   This chart was scribed in my presence and reviewed by me personally.    ICD-9-CM ICD-10-CM   1. Chronic kidney disease (CKD), stage IV (severe) 585.4 N18.4   2. ESRD on dialysis 585.6 N18.6    V45.11 Z99.2      Signed, Robyn Haber, MD  Signed, Robyn Haber, MD 12/25/2014 3:26 PM

## 2014-12-27 ENCOUNTER — Ambulatory Visit
Admission: RE | Admit: 2014-12-27 | Discharge: 2014-12-27 | Disposition: A | Discharge status: Home / Self Care | Payer: Medicare Other | Source: Ambulatory Visit | Attending: Internal Medicine | Admitting: Internal Medicine

## 2014-12-27 DIAGNOSIS — Q438 Other specified congenital malformations of intestine: Secondary | ICD-10-CM

## 2015-04-18 ENCOUNTER — Ambulatory Visit: Payer: Medicare Other | Admitting: Neurology

## 2015-05-01 ENCOUNTER — Ambulatory Visit (INDEPENDENT_AMBULATORY_CARE_PROVIDER_SITE_OTHER): Payer: 59 | Admitting: Neurology

## 2015-05-01 ENCOUNTER — Encounter: Payer: Self-pay | Admitting: Neurology

## 2015-05-01 VITALS — BP 123/78 | HR 101 | Ht 76.0 in | Wt 282.4 lb

## 2015-05-01 DIAGNOSIS — M5416 Radiculopathy, lumbar region: Secondary | ICD-10-CM | POA: Diagnosis not present

## 2015-05-01 DIAGNOSIS — R29898 Other symptoms and signs involving the musculoskeletal system: Secondary | ICD-10-CM | POA: Diagnosis not present

## 2015-05-01 DIAGNOSIS — M5417 Radiculopathy, lumbosacral region: Secondary | ICD-10-CM

## 2015-05-01 MED ORDER — TRAMADOL HCL 50 MG PO TABS: 50.0000 mg | ORAL_TABLET | Freq: Two times a day (BID) | ORAL | Status: DC | PRN

## 2015-05-01 NOTE — Patient Instructions (Signed)
Remember to drink plenty of fluid, eat healthy meals and do not skip any meals. Try to eat protein with a every meal and eat a healthy snack such as fruit or nuts in between meals. Try to keep a regular sleep-wake schedule and try to exercise daily, particularly in the form of walking, 20-30 minutes a day, if you can.   As far as your medications are concerned, I would like to suggest: tramadol as prescribed  As far as diagnostic testing: MRi of the lumbar spine  I would like to see you back in 4-6 months, sooner if we need to. Please call us with any interim questions, concerns, problems, updates or refill requests.   Our phone number is [phone]. We also have an after hours call service for urgent matters and there is a physician on-call for urgent questions. For any emergencies you know to call 911 or go to the nearest emergency room

## 2015-05-01 NOTE — Progress Notes (Signed)
[facility]    Provider:  Dr Jaynee Eagles Referring Provider: Robyn Haber, MD Primary Care Physician:  Robyn Haber, MD  CC: numbness  Interval history 11/21: he is having bad sciatica. The more he walks or stands. He has a sharp pain in the middle of his butt cheeks and radiates into the groin and all the way down the leg and down to the feet. He has weakness in the right leg. Worsening. Gets so bad he can't even walk in the house. He is taking Alleve which seems to help but doesn't get rid of it. He has heel pain in the right leg and is tender to the touch. The back pain started 6 weeks ago and getting worse. He has LBP. Sharp in the buttocks. If he stretches forward it helps a lot. Can be extremely bad, daily. When he has the pain he has to sit which makes it better. No bowel or bladder changes. Interfering with walking and exercising. He still has sensory changes in the feet due to the polyneuropathy.  HPI: Mario Woods is a 54 y.o. male here as a referral from Dr. Joseph Art for numbness in the balls of his feet and the toes. 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 Started 4-6 months ago. He first noticed it when he felt cold in the winter. Symptoms are continuous. He notices it more now, doesn't prevent him from doing anything, his feet are starting to hurt more when exercising, has cramps in his feet. Balance is poor but no falls, he feels less balanced. He has to focus more on walking than he used to. No low back pain or radicular symptoms. Symptoms are symmetric in the feet and the hands or not affected. His left buttocks and back of the thigh are numb which is chronic from teenage years. No bowel or bladder issues.   Review of Systems: Patient complains of symptoms per HPI as well as the following symptoms: cough. No CP, no SOB. Pertinent negatives per HPI. All others negative.   Social History   Social History  . Marital Status: Married    Spouse Name: Katy Apo  . Number of Children: 5  . Years of Education: BS   Occupational History  . [employer]    Social History Main Topics  . Smoking status: Former Smoker -- 0.25 packs/day for 20 years    Types: Cigarettes  . Smokeless tobacco: Never Used     Comment: social smoker   . Alcohol Use: 0.0 oz/week    0 Standard drinks or equivalent per week     Comment: minimal-rare   . Drug Use: No  . Sexual Activity: Yes    Birth Control/ Protection: Inserts   Other Topics Concern  . Not on file   Social History Narrative   Lives at home with wife and children.   Caffeine use: occassional     Family History  Problem Relation Age of Onset  . Pancreatic cancer Maternal Grandmother   . Colon cancer Neg Hx   . Colon polyps Neg Hx   . Rectal cancer Neg Hx   . Stomach cancer Neg Hx     Past Medical History  Diagnosis Date  . H/O kidney transplant     takes Cellcept and Prograf daily-off as transplant failed   . Gout due to renal impairment     history (04/28/2012)  . Hyperlipidemia     borderline but no meds  . Pneumonia  hx of;last time 2010  . History of bronchitis     unsure of how long ago  . Tingling     fingers r/t meds he takes  . History of gout     but no meds required  . Diarrhea     med  . Anemia   . History of blood transfusion     no abnormal reaction noted  . Hypertension     but doesn't take any meds  . Shortness of breath     With exertion  . Other back pain     Disc problems - age 65 y/o (89)  . Chronic kidney disease     dialysis [facility]   . Dialysis AV fistula infection     left forearm  . Failed kidney transplant     started dialysis 02-2013 after failure    Past Surgical History  Procedure Laterality Date  . Kidney transplant  04/2009  . Av fistula placement      Left  . Incisional hernia repair  04/27/2012    Procedure: LAPAROSCOPIC INCISIONAL HERNIA;  Surgeon: [name], MD;  Location:  [facility];  Service: General;  Laterality: N/A;  . Insertion of mesh  04/27/2012    Procedure: INSERTION OF MESH;  Surgeon: [name], MD;  Location: [place];  Service: General;  Laterality: N/A;  . Hernia repair  01/2009    primary ventral hernia repair x2   . Appendectomy  01/2009  . Capd insertion N/A 08/03/2013    Procedure: LAPAROSCOPIC EXPLORATION OF THE ABDOMEN WITH PLACEMENT OF PERITONEAL DIALYSIS CATHETER;  Surgeon: Adin Hector, MD;  Location: [place];  Service: General;  Laterality: N/A;  . Capd insertion N/A 09/06/2013    Procedure: DIAGNOSTIC LAPAROSCOPY WITH  REPOSITIONING OF CONTINUOUS AMBULATORY PERITONEAL DIALYSIS  (CAPD) CATHETER ;  Surgeon: Adin Hector, MD;  Location: [place];  Service: General;  Laterality: N/A;  . Capd removal N/A 02/08/2014    Procedure: REMOVAL OF PERITONEAL DIALYSIS CATHETER;  Surgeon: Michael Boston, MD;  Location: [place];  Service: General;  Laterality: N/A;    Current Outpatient Prescriptions  Medication Sig Dispense Refill  . amLODipine (NORVASC) 10 MG tablet 1 tablet on non dialysis days  5  . calcium carbonate (CALCIUM ANTACID EXTRA STRENGTH) 750 MG chewable tablet Chew 750 mg by mouth.    . calcium carbonate (OS-CAL) 600 MG TABS tablet Take 600 mg by mouth 3 (three) times daily with meals as needed.    . SENSIPAR 60 MG tablet Take 1 tablet by mouth daily.  6  . sevelamer carbonate (RENVELA) 800 MG tablet Take 2,400 mg by mouth 3 (three) times daily with meals.      No current facility-administered medications for this visit.    Allergies as of 05/01/2015 - Review Complete 05/01/2015  Allergen Reaction Noted  . Tape Rash 04/20/2012    Vitals: Ht 6\' 4"  (1.93 m)  Wt 282 lb 6.4 oz (128.096 kg)  BMI 34.39 kg/m2 Last Weight:  Wt Readings from Last 1 Encounters:  05/01/15 282 lb 6.4 oz (128.096 kg)   Last Height:   Ht Readings from Last 1 Encounters:  05/01/15 6\' 4"  (1.93 m)   Cranial Nerves:  The pupils are equal, round, and reactive  to light. Visual fields are full to finger confrontation. Extraocular movements are intact. Trigeminal sensation is intact and the muscles of mastication are normal. The face is symmetric. The palate elevates in the midline. Hearing intact.  Voice is normal. Shoulder shrug is normal. The tongue has normal motion without fasciculations.   Tone:  Normal muscle tone.   Posture:  Posture is normal. normal erect   Strength:  Strength is V/V in the upper and lower limbs.    Sensation: Decreased pinprick and temperature distally in the lower extremities to the ankles, absent vibration at the great toes, intact proprioception   Reflex Exam:  DTR's: absent AJs. Deep tendon reflexes in the upper and lower extremities are hyporeflexic bilaterally.    Assessment/Plan: 54 year old patient with a past medical history of chronic kidney disease who has symmetric numbness in his feet, cramping and some imbalance. This is likely peripheral polyneuropathy. Exam shows large and small fiber nerve distal decrease in the feet. Strength is intact. Hands are unaffected. It is very common to have peripheral polyneuropathy due to chronic kidney disease. The best way to prevent progression is to be compliant with medications and dialysis. A thorough serum evaluation for peripheral neuropathy for other etiologies was negative. Will follow clinically.  Also with worsening lumbosacral radiculitis. Will order MRi of the lumbar spine   Sarina Ill, MD  [facility] [address] [place], [place] [address]  Phone [phone] Fax [phone]  A total of 30 minutes was spent face-to-face with this patient. Over half this time was spent on counseling patient on the neuropathy and lumbosacral radiculopathy diagnosis and different diagnostic and therapeutic options available.

## 2015-05-16 ENCOUNTER — Ambulatory Visit
Admission: RE | Admit: 2015-05-16 | Discharge: 2015-05-16 | Disposition: A | Discharge status: Home / Self Care | Payer: Medicare Other | Source: Ambulatory Visit | Attending: Neurology | Admitting: Neurology

## 2015-05-16 DIAGNOSIS — R29898 Other symptoms and signs involving the musculoskeletal system: Secondary | ICD-10-CM

## 2015-05-16 DIAGNOSIS — M5416 Radiculopathy, lumbar region: Secondary | ICD-10-CM | POA: Diagnosis not present

## 2015-05-16 DIAGNOSIS — M5417 Radiculopathy, lumbosacral region: Secondary | ICD-10-CM

## 2015-05-18 ENCOUNTER — Telehealth: Payer: Self-pay | Admitting: Neurology

## 2015-05-18 NOTE — Telephone Encounter (Signed)
Please call patient. MRi of the back showed degenerative arthritic changes but not a pinched nerve. We cna refer him to pain management where they can evaluate him for procedures such as facet blocks or otherwise into the back to see if that will help his back pain. Let me know and I can place the order. He will have to get a copy of the MRI of the low back and bring it with him thanks.

## 2015-05-22 NOTE — Telephone Encounter (Signed)
Left message for patient to call back  

## 2015-05-24 NOTE — Telephone Encounter (Signed)
LVM for pt to call about MRI results. Gave [facility] phone number and hours.

## 2015-05-25 NOTE — Telephone Encounter (Signed)
Spoke to pt about results per Dr Jaynee Eagles notes below. He would like to hold off on referral to pain clinic. Mario Woods he has been able to control pain right now. He will call back if it gets worse/decides he wants referral.

## 2015-10-12 ENCOUNTER — Other Ambulatory Visit: Payer: Self-pay | Admitting: Neurology

## 2015-10-12 NOTE — Telephone Encounter (Signed)
Dr Jaynee Eagles- Please advise. Pt has f/u appt already scheduled for 5/23. Do you want to fit him in sooner or would you want me to offer referral to pain clinic again? He declined back in December, stating he was able to control sx at that time.

## 2015-10-12 NOTE — Telephone Encounter (Signed)
I tried to call tonight, answering machine picked up. I need to see patient in clinic to discuss, the MRI of his lumbar spine did not show any pinched nerves so his pain may be more related to his hip or other cause. We probably need to schedule an emg/ncs as well. If this is more hip pain, maybe he can see his primary care who may consider imaging the hip. In the meantime he can take some Tramadol. Happy to give him a temporary refill if he has run out.Thanks

## 2015-10-12 NOTE — Telephone Encounter (Signed)
Patient is calling. He has been having left side hip and leg pain for about a week. He has taken OTC pain medication but it has not helped. Please call to discuss.

## 2015-10-13 ENCOUNTER — Ambulatory Visit (INDEPENDENT_AMBULATORY_CARE_PROVIDER_SITE_OTHER): Payer: Medicare Other | Admitting: Family Medicine

## 2015-10-13 VITALS — BP 105/68 | HR 107 | Temp 97.8°F | Resp 18 | Ht 77.0 in | Wt 282.0 lb

## 2015-10-13 DIAGNOSIS — B029 Zoster without complications: Secondary | ICD-10-CM

## 2015-10-13 MED ORDER — VALACYCLOVIR HCL 1 G PO TABS: 1000.0000 mg | ORAL_TABLET | Freq: Three times a day (TID) | ORAL | Status: DC

## 2015-10-13 MED ORDER — TRAMADOL HCL 50 MG PO TABS: 50.0000 mg | ORAL_TABLET | Freq: Two times a day (BID) | ORAL | Status: DC | PRN

## 2015-10-13 NOTE — Telephone Encounter (Signed)
Faxed printed/signed rx tramadol to pt pharmacy. Received confirmation.

## 2015-10-13 NOTE — Patient Instructions (Addendum)
Shingles Shingles, which is also known as herpes zoster, is an infection that causes a painful skin rash and fluid-filled blisters. Shingles is not related to genital herpes, which is a sexually transmitted infection.   Shingles only develops in people who:  Have had chickenpox.  Have received the chickenpox vaccine. (This is rare.) CAUSES Shingles is caused by varicella-zoster virus (VZV). This is the same virus that causes chickenpox. After exposure to VZV, the virus stays in the body in an inactive (dormant) state. Shingles develops if the virus reactivates. This can happen many years after the initial exposure to VZV. It is not known what causes this virus to reactivate. RISK FACTORS People who have had chickenpox or received the chickenpox vaccine are at risk for shingles. Infection is more common in people who:  Are older than age 50.  Have a weakened defense (immune) system, such as those with HIV, AIDS, or cancer.  Are taking medicines that weaken the immune system, such as transplant medicines.  Are under great stress. SYMPTOMS Early symptoms of this condition include itching, tingling, and pain in an area on your skin. Pain may be described as burning, stabbing, or throbbing. A few days or weeks after symptoms start, a painful red rash appears, usually on one side of the body in a bandlike or beltlike pattern. The rash eventually turns into fluid-filled blisters that break open, scab over, and dry up in about 2-3 weeks. At any time during the infection, you may also develop:  A fever.  Chills.  A headache.  An upset stomach. DIAGNOSIS This condition is diagnosed with a skin exam. Sometimes, skin or fluid samples are taken from the blisters before a diagnosis is made. These samples are examined under a microscope or sent to a lab for testing. TREATMENT There is no specific cure for this condition. Your health care provider will probably prescribe medicines to help you  manage pain, recover more quickly, and avoid long-term problems. Medicines may include:  Antiviral drugs.  Anti-inflammatory drugs.  Pain medicines. If the area involved is on your face, you may be referred to a specialist, such as an eye doctor (ophthalmologist) or an ear, nose, and throat (ENT) doctor to help you avoid eye problems, chronic pain, or disability. HOME CARE INSTRUCTIONS Medicines  Take medicines only as directed by your health care provider.  Apply an anti-itch or numbing cream to the affected area as directed by your health care provider. Blister and Rash Care  Take a cool bath or apply cool compresses to the area of the rash or blisters as directed by your health care provider. This may help with pain and itching.  Keep your rash covered with a loose bandage (dressing). Wear loose-fitting clothing to help ease the pain of material rubbing against the rash.  Keep your rash and blisters clean with mild soap and cool water or as directed by your health care provider.  Check your rash every day for signs of infection. These include redness, swelling, and pain that lasts or increases.  Do not pick your blisters.  Do not scratch your rash. General Instructions  Rest as directed by your health care provider.  Keep all follow-up visits as directed by your health care provider. This is important.  Until your blisters scab over, your infection can cause chickenpox in people who have never had it or been vaccinated against it. To prevent this from happening, avoid contact with other people, especially:  Babies.  Pregnant women.  Children   who have eczema.  Elderly people who have transplants.  People who have chronic illnesses, such as leukemia or AIDS. SEEK MEDICAL CARE IF:  Your pain is not relieved with prescribed medicines.  Your pain does not get better after the rash heals.  Your rash looks infected. Signs of infection include redness, swelling, and pain  that lasts or increases. SEEK IMMEDIATE MEDICAL CARE IF:  The rash is on your face or nose.  You have facial pain, pain around your eye area, or loss of feeling on one side of your face.  You have ear pain or you have ringing in your ear.  You have loss of taste.  Your condition gets worse.   This information is not intended to replace advice given to you by your health care provider. Make sure you discuss any questions you have with your health care provider.   Document Released: 05/27/2005 Document Revised: 06/17/2014 Document Reviewed: 04/07/2014 Elsevier Interactive Patient Education 2016 [employer].     IF you received an x-ray today, you will receive an invoice from [facility]. Please contact [facility] at [phone] with questions or concerns regarding your invoice.   IF you received labwork today, you will receive an invoice from [employer]. Please contact [facility] at [phone] with questions or concerns regarding your invoice.   Our billing staff will not be able to assist you with questions regarding bills from these companies.  You will be contacted with the lab results as soon as they are available. The fastest way to get your results is to activate your My Chart account. Instructions are located on the last page of this paperwork. If you have not heard from [place] regarding the results in 2 weeks, please contact this office.

## 2015-10-13 NOTE — Telephone Encounter (Signed)
Pt called office back. I relayed Dr Cathren Laine message below. May f/u for 10/18/15 at 4pm, check in 345 pm. Pt never filled rx tramadol from November. He does not have printed rx anymore. He would like new rx sent to pharmacy. I verified pharmacy with him over the phone. He states he is having left leg weakness and it is hard to raise his leg. He is having hip pain in lower back on left side. His groin muscle is affected. He states the pain "moves all around". The pain radiates everywhere per pt. He lays awake at night in pain. The last two days he has developed a rash. He declines starting any new medications recently. I advised he needs to f/u with pcp about rash. He verbalized understanding. Patient wanted to be seen in office today. I advised we are only open 8-12pm on Friday's. He stated this would not work anyway because he is in dialysis until 12 pm today anyway.  I advised Dr Jaynee Eagles may want to get him scheduled for EMG/NCS. I advised I will have Larene Beach call and get him set up if Dr Jaynee Eagles wants to move forward with this. He verbalized understanding.

## 2015-10-13 NOTE — Progress Notes (Signed)
This is a 55 year old gentleman who was on hemodialysis 3 times a week. 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 He continues to have dry skin and aching joints.  Several days ago patient developed sharp radiating pain down his left leg. Over the last 24 hours he's developed a rash on the inside of his thigh with tingling in the skin.  Patient has a neuropathy and is scheduled to see his neurologist next week.  Patient denies any fever, rash on the right leg (except a crusting lesion that he's had for years and is been told by his dermatologist is nothing serious)  Is a positive family history for shingles.  Objective: Papulovesicular rash running linearly down the inside of his left thigh into the proximal calf on the left. The skin is hypersensitive  BP 105/68 mmHg  Pulse 107  Temp(Src) 97.8 F (36.6 C)  Resp 18  Ht 6\' 5"  (1.956 m)  Wt 282 lb (127.914 kg)  BMI 33.43 kg/m2  SpO2 99% Assessment: Shingles  Shingles - Plan: valACYclovir (VALTREX) 1000 MG tablet  Signed, Carola Frost.D.

## 2015-10-13 NOTE — Telephone Encounter (Signed)
Per Dr Jaynee Eagles- okay to send new rx tramadol to pt pharmacy. She wants to see pt in office first before scheduling EMG/NCS.

## 2015-10-15 ENCOUNTER — Emergency Department (HOSPITAL_COMMUNITY): Payer: Medicare Other

## 2015-10-15 ENCOUNTER — Observation Stay (HOSPITAL_COMMUNITY)
Admission: EM | Admit: 2015-10-15 | Discharge: 2015-10-17 | Disposition: A | Discharge status: Home / Self Care | Payer: Medicare Other | Attending: Family Medicine | Admitting: Family Medicine

## 2015-10-15 ENCOUNTER — Encounter (HOSPITAL_COMMUNITY): Payer: Self-pay | Admitting: Emergency Medicine

## 2015-10-15 DIAGNOSIS — G92 Toxic encephalopathy: Principal | ICD-10-CM | POA: Insufficient documentation

## 2015-10-15 DIAGNOSIS — Z87891 Personal history of nicotine dependence: Secondary | ICD-10-CM | POA: Insufficient documentation

## 2015-10-15 DIAGNOSIS — R4182 Altered mental status, unspecified: Secondary | ICD-10-CM | POA: Diagnosis present

## 2015-10-15 DIAGNOSIS — B009 Herpesviral infection, unspecified: Secondary | ICD-10-CM | POA: Diagnosis not present

## 2015-10-15 DIAGNOSIS — E875 Hyperkalemia: Secondary | ICD-10-CM | POA: Diagnosis not present

## 2015-10-15 DIAGNOSIS — R260 Ataxic gait: Secondary | ICD-10-CM | POA: Insufficient documentation

## 2015-10-15 DIAGNOSIS — G609 Hereditary and idiopathic neuropathy, unspecified: Secondary | ICD-10-CM | POA: Insufficient documentation

## 2015-10-15 DIAGNOSIS — M47817 Spondylosis without myelopathy or radiculopathy, lumbosacral region: Secondary | ICD-10-CM | POA: Diagnosis not present

## 2015-10-15 DIAGNOSIS — I120 Hypertensive chronic kidney disease with stage 5 chronic kidney disease or end stage renal disease: Secondary | ICD-10-CM | POA: Diagnosis not present

## 2015-10-15 DIAGNOSIS — T8612 Kidney transplant failure: Secondary | ICD-10-CM | POA: Insufficient documentation

## 2015-10-15 DIAGNOSIS — X58XXXA Exposure to other specified factors, initial encounter: Secondary | ICD-10-CM | POA: Diagnosis not present

## 2015-10-15 DIAGNOSIS — D631 Anemia in chronic kidney disease: Secondary | ICD-10-CM | POA: Diagnosis not present

## 2015-10-15 DIAGNOSIS — Z6833 Body mass index (BMI) 33.0-33.9, adult: Secondary | ICD-10-CM | POA: Diagnosis not present

## 2015-10-15 DIAGNOSIS — Z992 Dependence on renal dialysis: Secondary | ICD-10-CM | POA: Diagnosis not present

## 2015-10-15 DIAGNOSIS — M1030 Gout due to renal impairment, unspecified site: Secondary | ICD-10-CM | POA: Insufficient documentation

## 2015-10-15 DIAGNOSIS — T375X5A Adverse effect of antiviral drugs, initial encounter: Secondary | ICD-10-CM

## 2015-10-15 DIAGNOSIS — G929 Unspecified toxic encephalopathy: Secondary | ICD-10-CM | POA: Diagnosis present

## 2015-10-15 DIAGNOSIS — E669 Obesity, unspecified: Secondary | ICD-10-CM | POA: Diagnosis not present

## 2015-10-15 DIAGNOSIS — D539 Nutritional anemia, unspecified: Secondary | ICD-10-CM | POA: Diagnosis not present

## 2015-10-15 DIAGNOSIS — N186 End stage renal disease: Secondary | ICD-10-CM | POA: Diagnosis not present

## 2015-10-15 LAB — COMPREHENSIVE METABOLIC PANEL
ALBUMIN: 3.2 g/dL — AB (ref 3.5–5.0)
ALK PHOS: 108 U/L (ref 38–126)
ALT: 22 U/L (ref 17–63)
AST: 16 U/L (ref 15–41)
Anion gap: 22 — ABNORMAL HIGH (ref 5–15)
BUN: 88 mg/dL — ABNORMAL HIGH (ref 6–20)
CALCIUM: 10 mg/dL (ref 8.9–10.3)
CHLORIDE: 95 mmol/L — AB (ref 101–111)
CO2: 21 mmol/L — AB (ref 22–32)
CREATININE: 12.72 mg/dL — AB (ref 0.61–1.24)
GFR calc non Af Amer: 4 mL/min — ABNORMAL LOW (ref >60)
GFR, EST AFRICAN AMERICAN: 5 mL/min — AB (ref >60)
GLUCOSE: 94 mg/dL (ref 65–99)
Potassium: 6.6 mmol/L (ref 3.5–5.1)
SODIUM: 138 mmol/L (ref 135–145)
Total Bilirubin: 0.8 mg/dL (ref 0.3–1.2)
Total Protein: 6.7 g/dL (ref 6.5–8.1)

## 2015-10-15 LAB — CBC
HCT: 33.9 % — ABNORMAL LOW (ref 39.0–52.0)
HCT: 32.7 % — ABNORMAL LOW (ref 39.0–52.0)
Hemoglobin: 11.1 g/dL — ABNORMAL LOW (ref 13.0–17.0)
Hemoglobin: 11 g/dL — ABNORMAL LOW (ref 13.0–17.0)
MCH: 34.5 pg — ABNORMAL HIGH (ref 26.0–34.0)
MCH: 35.6 pg — AB (ref 26.0–34.0)
MCHC: 32.7 g/dL (ref 30.0–36.0)
MCHC: 33.6 g/dL (ref 30.0–36.0)
MCV: 105.3 fL — AB (ref 78.0–100.0)
MCV: 105.8 fL — ABNORMAL HIGH (ref 78.0–100.0)
PLATELETS: 180 10*3/uL (ref 150–400)
PLATELETS: 206 10*3/uL (ref 150–400)
RBC: 3.22 MIL/uL — AB (ref 4.22–5.81)
RBC: 3.09 MIL/uL — AB (ref 4.22–5.81)
RDW: 14.6 % (ref 11.5–15.5)
RDW: 14.6 % (ref 11.5–15.5)
WBC: 6.4 10*3/uL (ref 4.0–10.5)
WBC: 7.4 10*3/uL (ref 4.0–10.5)

## 2015-10-15 LAB — DIFFERENTIAL
BASOS ABS: 0 10*3/uL (ref 0.0–0.1)
BASOS PCT: 1 %
Eosinophils Absolute: 0.4 10*3/uL (ref 0.0–0.7)
Eosinophils Relative: 6 %
LYMPHS PCT: 10 %
Lymphs Abs: 0.6 10*3/uL — ABNORMAL LOW (ref 0.7–4.0)
MONO ABS: 0.6 10*3/uL (ref 0.1–1.0)
Monocytes Relative: 10 %
NEUTROS ABS: 4.8 10*3/uL (ref 1.7–7.7)
NEUTROS PCT: 75 %

## 2015-10-15 LAB — I-STAT CHEM 8, ED
BUN: 94 mg/dL — AB (ref 6–20)
CALCIUM ION: 1.18 mmol/L (ref 1.12–1.23)
CHLORIDE: 99 mmol/L — AB (ref 101–111)
Creatinine, Ser: 12.4 mg/dL — ABNORMAL HIGH (ref 0.61–1.24)
Glucose, Bld: 91 mg/dL (ref 65–99)
HEMATOCRIT: 35 % — AB (ref 39.0–52.0)
Hemoglobin: 11.9 g/dL — ABNORMAL LOW (ref 13.0–17.0)
Potassium: 6.4 mmol/L (ref 3.5–5.1)
SODIUM: 135 mmol/L (ref 135–145)
TCO2: 26 mmol/L (ref 0–100)

## 2015-10-15 LAB — ETHANOL

## 2015-10-15 LAB — CREATININE, SERUM
CREATININE: 13.35 mg/dL — AB (ref 0.61–1.24)
GFR calc Af Amer: 4 mL/min — ABNORMAL LOW (ref >60)
GFR calc non Af Amer: 4 mL/min — ABNORMAL LOW (ref >60)

## 2015-10-15 LAB — FOLATE: FOLATE: 14.9 ng/mL (ref >5.9)

## 2015-10-15 LAB — PROTIME-INR
INR: 0.98 (ref 0.00–1.49)
PROTHROMBIN TIME: 13.2 s (ref 11.6–15.2)

## 2015-10-15 LAB — VITAMIN B12: Vitamin B-12: 445 pg/mL (ref 180–914)

## 2015-10-15 LAB — I-STAT TROPONIN, ED: Troponin i, poc: 0 ng/mL (ref 0.00–0.08)

## 2015-10-15 LAB — TSH: TSH: 0.739 u[IU]/mL (ref 0.350–4.500)

## 2015-10-15 LAB — PHOSPHORUS: Phosphorus: 6.9 mg/dL — ABNORMAL HIGH (ref 2.5–4.6)

## 2015-10-15 LAB — APTT: APTT: 31 s (ref 24–37)

## 2015-10-15 LAB — MAGNESIUM: Magnesium: 2.8 mg/dL — ABNORMAL HIGH (ref 1.7–2.4)

## 2015-10-15 MED ORDER — ACETAMINOPHEN 325 MG PO TABS: 650.0000 mg | ORAL_TABLET | Freq: Four times a day (QID) | ORAL | Status: DC | PRN

## 2015-10-15 MED ORDER — SODIUM CHLORIDE 0.9% FLUSH
3.0000 mL | Freq: Two times a day (BID) | INTRAVENOUS | Status: DC
  Administered 2015-10-16 – 2015-10-17 (×3): 3 mL via INTRAVENOUS

## 2015-10-15 MED ORDER — ACETAMINOPHEN 650 MG RE SUPP: 650.0000 mg | Freq: Four times a day (QID) | RECTAL | Status: DC | PRN

## 2015-10-15 MED ORDER — SEVELAMER CARBONATE 800 MG PO TABS
2400.0000 mg | ORAL_TABLET | Freq: Three times a day (TID) | ORAL | Status: DC
  Administered 2015-10-16 – 2015-10-17 (×5): 2400 mg via ORAL
  Filled 2015-10-15 (×5): qty 3

## 2015-10-15 MED ORDER — CALCIUM GLUCONATE 10 % IV SOLN
1.0000 g | Freq: Once | INTRAVENOUS | Status: AC
  Administered 2015-10-15: 1 g via INTRAVENOUS
  Filled 2015-10-15: qty 10

## 2015-10-15 MED ORDER — SODIUM POLYSTYRENE SULFONATE 15 GM/60ML PO SUSP
30.0000 g | Freq: Once | ORAL | Status: AC
  Administered 2015-10-15: 30 g via ORAL
  Filled 2015-10-15: qty 120

## 2015-10-15 MED ORDER — LABETALOL HCL 5 MG/ML IV SOLN
10.0000 mg | INTRAVENOUS | Status: DC | PRN
  Administered 2015-10-15 – 2015-10-16 (×2): 10 mg via INTRAVENOUS
  Filled 2015-10-15 (×2): qty 4

## 2015-10-15 MED ORDER — PANTOPRAZOLE SODIUM 20 MG PO TBEC
20.0000 mg | DELAYED_RELEASE_TABLET | Freq: Every day | ORAL | Status: DC
  Administered 2015-10-15 – 2015-10-17 (×3): 20 mg via ORAL
  Filled 2015-10-15 (×3): qty 1

## 2015-10-15 MED ORDER — INSULIN ASPART 100 UNIT/ML IV SOLN: 5.0000 [IU] | Freq: Once | INTRAVENOUS | Status: DC

## 2015-10-15 MED ORDER — CALCIUM CARBONATE ANTACID 500 MG PO CHEW
1.0000 | CHEWABLE_TABLET | Freq: Three times a day (TID) | ORAL | Status: DC
  Administered 2015-10-16 – 2015-10-17 (×5): 200 mg via ORAL
  Filled 2015-10-15 (×5): qty 1

## 2015-10-15 MED ORDER — SODIUM CHLORIDE 0.9% FLUSH: 3.0000 mL | INTRAVENOUS | Status: DC | PRN

## 2015-10-15 MED ORDER — CINACALCET HCL 30 MG PO TABS
60.0000 mg | ORAL_TABLET | Freq: Every day | ORAL | Status: DC
  Administered 2015-10-15 – 2015-10-16 (×2): 60 mg via ORAL
  Filled 2015-10-15 (×3): qty 2

## 2015-10-15 MED ORDER — HEPARIN SODIUM (PORCINE) 5000 UNIT/ML IJ SOLN
5000.0000 [IU] | Freq: Three times a day (TID) | INTRAMUSCULAR | Status: DC
  Administered 2015-10-15 – 2015-10-17 (×4): 5000 [IU] via SUBCUTANEOUS
  Filled 2015-10-15 (×4): qty 1

## 2015-10-15 MED ORDER — LANTHANUM CARBONATE 500 MG PO CHEW: 2000.0000 mg | CHEWABLE_TABLET | Freq: Three times a day (TID) | ORAL | Status: DC

## 2015-10-15 MED ORDER — SODIUM CHLORIDE 0.9 % IV SOLN: 250.0000 mL | INTRAVENOUS | Status: DC | PRN

## 2015-10-15 MED ORDER — SODIUM CHLORIDE 0.9% FLUSH
3.0000 mL | Freq: Two times a day (BID) | INTRAVENOUS | Status: DC
  Administered 2015-10-15: 3 mL via INTRAVENOUS

## 2015-10-15 MED ORDER — CALCIUM CARBONATE 1500 (600 CA) MG PO TABS
600.0000 mg | ORAL_TABLET | Freq: Three times a day (TID) | ORAL | Status: DC
  Filled 2015-10-15: qty 1

## 2015-10-15 MED ORDER — DEXTROSE 10 % IV SOLN: Freq: Once | INTRAVENOUS | Status: DC

## 2015-10-15 NOTE — ED Notes (Signed)
MD at bedside. 

## 2015-10-15 NOTE — Consult Note (Signed)
Renal Service Consult Note [facility]  Mario Woods 10/15/2015 [place] Requesting Physician:  Dr. Nori Riis  Reason for Consult:  ESRD pt w shingles and  HPI: The patient is a 55 y.o. 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  He was diagnosed with herpes zoster L leg recently and was pot on Rx with valacyclovir at 1gm tid 2 days ago.  Yesterday he developed confusion, hallucinations, slurred speech and labile moods.  Came to ED and admitted.  He had taken about 5-6 gm total of the valacyclovir from Friday to Sat and his last dose was about 8 pm on Sat. Today is Sunday.    Does HD with left forearm AV fistula at [facility] on MWF schedule. Started dialysis about 2010, had renal transplant then which lasted until 2014 and has been back on HD since.  Has done PD in the past as well.    No cough, CP, visual change.  NO abd pain, n/v/d, no fevers or chills.     ROS  denies CP  no joint pain   no HA  no blurry vision  no rash  no diarrhea  no nausea/ vomiting  no dysuria  no difficulty voiding  no change in urine color    Past Medical History  Past Medical History  Diagnosis Date  . H/O kidney transplant     takes Cellcept and Prograf daily-off as transplant failed   . Gout due to renal impairment     history (04/28/2012)  . Hyperlipidemia     borderline but no meds  . Pneumonia     hx of;last time 2010  . History of bronchitis     unsure of how long ago  . Tingling     fingers r/t meds he takes  . History of gout     but no meds required  . Diarrhea     med  . Anemia   . History of blood transfusion     no abnormal reaction noted  . Hypertension     but doesn't take any meds  . Shortness of breath     With exertion  . Other back pain     Disc problems - age 33 y/o (26)  . Chronic kidney disease     dialysis M<W<F-[facility]   . Dialysis AV fistula infection ([place])      left forearm  . Failed kidney transplant     started dialysis 02-2013 after failure   Past Surgical History  Past Surgical History  Procedure Laterality Date  . Kidney transplant  04/2009  . Av fistula placement      Left  . Incisional hernia repair  04/27/2012    Procedure: LAPAROSCOPIC INCISIONAL HERNIA;  Surgeon: [name], MD;  Location: [place];  Service: General;  Laterality: N/A;  . Insertion of mesh  04/27/2012    Procedure: INSERTION OF MESH;  Surgeon: [name], MD;  Location: [place];  Service: General;  Laterality: N/A;  . Hernia repair  01/2009    primary ventral hernia repair x2   . Appendectomy  01/2009  . Capd insertion N/A 08/03/2013    Procedure: LAPAROSCOPIC EXPLORATION OF THE ABDOMEN WITH PLACEMENT OF PERITONEAL DIALYSIS CATHETER;  Surgeon: Adin Hector, MD;  Location: [place];  Service: General;  Laterality: N/A;  . Capd insertion N/A 09/06/2013    Procedure: DIAGNOSTIC LAPAROSCOPY WITH  REPOSITIONING OF CONTINUOUS AMBULATORY PERITONEAL DIALYSIS  (CAPD) CATHETER ;  Surgeon: Adin Hector, MD;  Location: [place];  Service: General;  Laterality: N/A;  . Capd removal N/A 02/08/2014    Procedure: REMOVAL OF PERITONEAL DIALYSIS CATHETER;  Surgeon: Michael Boston, MD;  Location: [place];  Service: General;  Laterality: N/A;   Family History  Family History  Problem Relation Age of Onset  . Pancreatic cancer Maternal Grandmother   . Colon cancer Neg Hx   . Colon polyps Neg Hx   . Rectal cancer Neg Hx   . Stomach cancer Neg Hx    Social History  reports that he has quit smoking. His smoking use included Cigarettes. He has a 5 pack-year smoking history. He has never used smokeless tobacco. He reports that he drinks alcohol. He reports that he does not use illicit drugs. Allergies  Allergies  Allergen Reactions  . Tape Rash    The patient stated that he really doesn't think this is a problem now. Paper tape ok   Home medications Prior to Admission medications    Medication Sig Start Date End Date Taking? Authorizing Provider  calcium carbonate (OS-CAL) 600 MG TABS tablet Take 600 mg by mouth 3 (three) times daily with meals as needed. Reported on 10/13/2015   Yes Historical Provider, MD  lanthanum (FOSRENOL) 1000 MG chewable tablet Chew 2,000 mg by mouth 3 (three) times daily with meals.   Yes Historical Provider, MD  SENSIPAR 60 MG tablet Take 1 tablet by mouth daily. 07/07/14  Yes Historical Provider, MD  traMADol (ULTRAM) 50 MG tablet Take 1 tablet (50 mg total) by mouth every 12 (twelve) hours as needed. 10/13/15  Yes Melvenia Beam, MD  valACYclovir (VALTREX) 1000 MG tablet Take 1 tablet (1,000 mg total) by mouth 3 (three) times daily. 10/13/15  Yes Robyn Haber, MD  calcium carbonate (CALCIUM ANTACID EXTRA STRENGTH) 750 MG chewable tablet Chew 750 mg by mouth. Reported on 10/13/2015    Historical Provider, MD  sevelamer carbonate (RENVELA) 800 MG tablet Take 2,400 mg by mouth 3 (three) times daily with meals. Reported on 10/13/2015    Historical Provider, MD   Liver Function Tests  Recent Labs Lab 10/15/15 1151  AST 16  ALT 22  ALKPHOS 108  BILITOT 0.8  PROT 6.7  ALBUMIN 3.2*   No results for input(s): LIPASE, AMYLASE in the last 168 hours. CBC  Recent Labs Lab 10/15/15 1151 10/15/15 1215  WBC 6.4  --   NEUTROABS 4.8  --   HGB 11.1* 11.9*  HCT 33.9* 35.0*  MCV 105.3*  --   PLT 180  --    Basic Metabolic Panel  Recent Labs Lab 10/15/15 1151 10/15/15 1215  NA 138 135  K 6.6* 6.4*  CL 95* 99*  CO2 21*  --   GLUCOSE 94 91  BUN 88* 94*  CREATININE 12.72* 12.40*  CALCIUM 10.0  --     Filed Vitals:   10/15/15 1200 10/15/15 1500 10/15/15 1530 10/15/15 1545  BP: 182/108 183/124 185/108 178/107  Pulse: 65 72 64 68  Resp: 16   14  Height:      Weight:      SpO2: 100% 100% 98% 97%   Exam Awake, sleepy, responds to commands, slurred speech and unsteady when trying to get up No rash, cyanosis or gangrene Sclera anicteric,  throat clear  No jvd or bruits Chest clear bilat RRR soft SEM no RG Abd soft ntnd no mass or  ascites +bs  , scar RLQ GU normal male MS no joint effusions or deformity Ext no LE edema / no wounds or ulcers Neuro is alert, Ox 3 , nf, slurred speech, Ox 3    Dialysis: [facility] MWF  Dry wt about 120-124kg  4.5h  Assessment: 1. Altered mental status/ ataxic gait/ hallucinations - likely due to overRx with valacyclovir which i known to cause these symptoms in the elderly and those with ESRD if not dose properly.  Proper dosing for ESRD is 500 mg once daily.   2. ESRD on HD MWF 3. Volume no gross vol overload 4. 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   6. Hyperkalemia - given kayexalate in ED   Plan - HD first shift in am tomorrow, get vol down and help to get rid of residual acyclovir in his system.    Kelly Splinter MD [employer] pager [phone]    cell [phone] 10/15/2015, 7:02 PM

## 2015-10-15 NOTE — H&P (Signed)
[facility] Admission History and Physical Service Pager: [phone]  Patient name: Mario Woods Medical record number: [id number] Date of birth: [birth year] Age: 55 y.o. Gender: male  Primary Care Provider: Robyn Haber, MD Consultants: Nephrology Code Status: Full  Chief Complaint: Altered Mental Status  Assessment and Plan: Mario Woods is a 55 y.o. male presenting with Toxic Metabolic Encephalopathy . 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   # Toxic Metabolic Encephalopathy - Most likely 2/2 high dose valacyclovir in ESRD patient. He has been taking 1000mg  TID. Renally adjusted should be 500mg  qd. Known to cause hallucinations, drowsiness, blurry vision, nausea, when overdosed. Neuro exam is nonfocal. He is more alert since being down in the ED. Able to give basic chronology of symptoms and knows, person, location, time. HTN in the ED, but was initially normotensive on admission. Wife says he is looking better.  - Admit Obs / Tele, Dr. Nori Riis Attending - Monitor overnight for improvement.  - Unable to dialyze Valtrex well per ED attending discussion with nephro.  - Neuro checks q4 hours.  - CT head in the ED normal, consider MRI if not clearing and especially if focal neurological deficits develop.  - TSH, Serum Drug Screen, B12, Folate.  - Mg, Phos - Hold home tramadol.   # ESRD / Hyperkalemia - pt. With known ESRD 2/2 IGA nephropathy. S/p failed txp. Followed by Dr. Lorrene Reid. K - 6.4 in the ED. Dialysis schedule MWF. EKG with notable t-wave changes.  - Dr. Melvia Heaps to see per ED physician.  - Dialysis tomorrow.  - Kayexalate today.  - Given Ca Gluconate, Insulin in the ED for K - repeat BMET tonight at 8.  - Continue home Renvela, Sensipar, Os-Cal  # HTN - pt. Usually normotensive. Likely related to agitation /  confusion. Developed HTN to 123XX123 - 123XX123 systolic in the ED.  - Tx with labetalol 10mg  prn SBP > 170 / 110.  - Continue to follow.   # Zoster - atypical dermotomal distribution. Has had chronic radicular pain in this area, but developed L3/L4 dermotomal rash similar to zoster. Had pain > itching and then rash. It is now crusted over reducing infectious risk. Itching / Pain is mostly gone now. He is high risk given ESRD.  - Holding valacyclovir at this point.  - consider PCR Zoster.   # Macrocytic Anemia - Macrocytosis at last check 2015.  - Check B12, Folate.   # Peripheral Neuropathy - related to ESRD. Uses tramadol for this.  - Holding tramadol for now.   # Lumbosacral Arthrosis - some mild degenerative changes on MRI 05/2015. No nerve root compression. Had been bothering him lately, however he then developed shingles in the affected area as noted above.  - Holding tramadol.   FEN/GI: Protonix  Prophylaxis: Heparin  Disposition: pending improvement in mental status.   History of Present Illness:  Mario Woods is a 55 y.o. male presenting with here with altered mental status for the past 2 days. He says that he had developed worsening left leg pain radiating down the front of his left leg and then developed a vesicular rash / itching 4 days ago. When the rash developed he went to see his PCP 3 days ago. He was diagnosed with Zoster infection at that time and prescribed Valacyclovir, however this was not renally adjusted. He was taking 1000mg TID.  He began to feel very foggy and confused on Saturday at work. By Saturday night he was unable to sleep and noted that he had visual disturbances / was hallucinating whenever he closed his eyes. He says this progressed to mumbling / slurred speech and stumbling around the house. His wife became worried today, and brought him to the ED. He had not had any nauasea, vomiting, weakness, visual deficits, fever, chills, diarrhea, or constipation. He has not  taken any other medicines and knows of no other ingestions. He is only on tramadol and his renal medicines otherwise. He did not have any facial droop, numbness, tingling, or weakness in any particular extremity.   In the ED, initial workup was unrevealing as to cause of his altered mental status. CT scan was negative, BAL was negative. He was mildly anemic with mild macrocytosis. CMET was notable for potassium of 6.6 with BUN 88, and mild AG acidosis. EKG with notable T wave changes. Nephrology was consulted from the ED and gave kayexalate. Ca Gluconate, and Insulin were also given in the ED.   While the patient was in the ED, he began to clear, and his speech improved significantly per his wife.     Review Of Systems: Per HPI with the following additions: none.  Otherwise the remainder of the systems were negative.  Patient Active Problem List   Diagnosis Date Noted  . Hyperkalemia 10/15/2015  . Hereditary and idiopathic peripheral neuropathy 11/15/2014  . Cough 08/31/2014  . Achilles tendonitis 11/26/2013  . Peritoneal dialysis catheter dysfunction s/p lap repositioning 09/06/2013 09/01/2013  . Obesity (BMI 30-39.9) 06/29/2013  . CKD (chronic kidney disease) stage V requiring chronic dialysis ([place]) 06/29/2013  . H/O primary IgA nephropathy 02/16/2013  . H/O kidney transplant 02/16/2013    Past Medical History: Past Medical History  Diagnosis Date  . H/O kidney transplant     takes Cellcept and Prograf daily-off as transplant failed   . Gout due to renal impairment     history (04/28/2012)  . Hyperlipidemia     borderline but no meds  . Pneumonia     hx of;last time 2010  . History of bronchitis     unsure of how long ago  . Tingling     fingers r/t meds he takes  . History of gout     but no meds required  . Diarrhea     med  . Anemia   . History of blood transfusion     no abnormal reaction noted  . Hypertension     but doesn't take any meds  . Shortness of breath      With exertion  . Other back pain     Disc problems - age 13 y/o (85)  . Chronic kidney disease     dialysis [facility]W<F[facility]   . Dialysis AV fistula infection ([place])     left forearm  . Failed kidney transplant     started dialysis 02-2013 after failure    Past Surgical History: Past Surgical History  Procedure Laterality Date  . Kidney transplant  04/2009  . Av fistula placement      Left  . Incisional hernia repair  04/27/2012    Procedure: LAPAROSCOPIC INCISIONAL HERNIA;  Surgeon: [name], MD;  Location: [place];  Service: General;  Laterality: N/A;  . Insertion of mesh  04/27/2012    Procedure: INSERTION OF MESH;  Surgeon: [name], MD;  Location: [place];  Service: General;  Laterality:  N/A;  . Hernia repair  01/2009    primary ventral hernia repair x2   . Appendectomy  01/2009  . Capd insertion N/A 08/03/2013    Procedure: LAPAROSCOPIC EXPLORATION OF THE ABDOMEN WITH PLACEMENT OF PERITONEAL DIALYSIS CATHETER;  Surgeon: Adin Hector, MD;  Location: [place];  Service: General;  Laterality: N/A;  . Capd insertion N/A 09/06/2013    Procedure: DIAGNOSTIC LAPAROSCOPY WITH  REPOSITIONING OF CONTINUOUS AMBULATORY PERITONEAL DIALYSIS  (CAPD) CATHETER ;  Surgeon: Adin Hector, MD;  Location: [place];  Service: General;  Laterality: N/A;  . Capd removal N/A 02/08/2014    Procedure: REMOVAL OF PERITONEAL DIALYSIS CATHETER;  Surgeon: Michael Boston, MD;  Location: [place];  Service: General;  Laterality: N/A;    Social History: Social History  Substance Use Topics  . Smoking status: Former Smoker -- 0.25 packs/day for 20 years    Types: Cigarettes  . Smokeless tobacco: Never Used     Comment: social smoker   . Alcohol Use: 0.0 oz/week    0 Standard drinks or equivalent per week     Comment: minimal-rare    Additional social history: Smokes 1 cig weekly.   Please also refer to relevant sections of EMR.  Family History: Family History  Problem Relation Age  of Onset  . Pancreatic cancer Maternal Grandmother   . Colon cancer Neg Hx   . Colon polyps Neg Hx   . Rectal cancer Neg Hx   . Stomach cancer Neg Hx    (If not completed, MUST add something in)  Allergies and Medications: Allergies  Allergen Reactions  . Tape Rash    The patient stated that he really doesn't think this is a problem now. Paper tape ok   No current facility-administered medications on file prior to encounter.   Current Outpatient Prescriptions on File Prior to Encounter  Medication Sig Dispense Refill  . calcium carbonate (OS-CAL) 600 MG TABS tablet Take 600 mg by mouth 3 (three) times daily with meals as needed. Reported on 10/13/2015    . SENSIPAR 60 MG tablet Take 1 tablet by mouth daily.  6  . traMADol (ULTRAM) 50 MG tablet Take 1 tablet (50 mg total) by mouth every 12 (twelve) hours as needed. 60 tablet 3  . valACYclovir (VALTREX) 1000 MG tablet Take 1 tablet (1,000 mg total) by mouth 3 (three) times daily. 21 tablet 0  . calcium carbonate (CALCIUM ANTACID EXTRA STRENGTH) 750 MG chewable tablet Chew 750 mg by mouth. Reported on 10/13/2015    . sevelamer carbonate (RENVELA) 800 MG tablet Take 2,400 mg by mouth 3 (three) times daily with meals. Reported on 10/13/2015      Objective: BP 183/124 mmHg  Pulse 72  Resp 16  Ht 6\' 5"  (1.956 m)  Wt 280 lb (127.007 kg)  BMI 33.20 kg/m2  SpO2 100% Exam: General: NAD, resting comfortably in bed.  Eyes: EOMI, PERRLA.  ENTM: Nares patent, O/P clear.  Neck: FROM, supple Cardiovascular: RRR, no MGR, normal S1/S2, 2+ distal pulses.  Respiratory: CTA Bilaterally, appropriate rate, unlabored.  Abdomen: S, NT, ND, +BS, no organomegally palpable.  MSK: No edema, nontender, Moves all extremities well.  Skin: Noted red vesicular rash on erythematous base over L3,4 dermatomes.  Neuro: CN II-XII tested and in tact, AAOx3, 5/5 motor in all major muscle groups, No sensory deficits, no cerebellar deficits. Mild asterixis on exam. Gait  deferred.  Psych: appropriate mood / affect.   Labs and Imaging: CBC BMET  Recent Labs Lab 10/15/15 1151 10/15/15 1215  WBC 6.4  --   HGB 11.1* 11.9*  HCT 33.9* 35.0*  PLT 180  --     Recent Labs Lab 10/15/15 1151 10/15/15 1215  NA 138 135  K 6.6* 6.4*  CL 95* 99*  CO2 21*  --   BUN 88* 94*  CREATININE 12.72* 12.40*  GLUCOSE 94 91  CALCIUM 10.0  --      CT HEad 5/7 normal.   Troponin 0.00.   EKG - diffuse T-wave peaking. No ST segment changes.   Aquilla Hacker, MD 10/15/2015, 3:11 PM PGY-2, [place] Intern pager: [phone], text pages welcome

## 2015-10-15 NOTE — ED Notes (Signed)
Critical Lab:  6.6 Potassium Dr. Regenia Skeeter made aware

## 2015-10-15 NOTE — ED Provider Notes (Signed)
CSN: [id number]     Arrival date & time 10/15/15  1041 History   First MD Initiated Contact with Patient 10/15/15 1110     Chief Complaint  Patient presents with  . Aphasia  . Difficulty Walking  . Herpes Zoster     (Consider location/radiation/quality/duration/timing/severity/associated sxs/prior Treatment) HPI  55 year old male presents with slurred speech, confusion, hallucinations, and trouble walking. Started last night around 10:00. Patient was diagnosed with shingles and his left lower extremity 2 days ago. Placed on tramadol 50 mg twice a day and valacyclovir 1 g 3 times a day. Has been taking these as instructed. No headaches. Some occasional blurry vision. The symptoms wax and wane. Significant other the bedside states that yesterday he was seeing certain things like a tail on a star. No fevers or rash on. Patient is a dialysis patient and last received dialysis on Friday (2 days ago).  Past Medical History  Diagnosis Date  . H/O kidney transplant     takes Cellcept and Prograf daily-off as transplant failed   . Gout due to renal impairment     history (04/28/2012)  . Hyperlipidemia     borderline but no meds  . Pneumonia     hx of;last time 2010  . History of bronchitis     unsure of how long ago  . Tingling     fingers r/t meds he takes  . History of gout     but no meds required  . Diarrhea     med  . Anemia   . History of blood transfusion     no abnormal reaction noted  . Hypertension     but doesn't take any meds  . Shortness of breath     With exertion  . Other back pain     Disc problems - age 60 y/o (49)  . Chronic kidney disease     d[facility]   . Dialysis AV fistula infection ([place])     left forearm  . Failed kidney transplant     started dialysis 02-2013 after failure   Past Surgical History  Procedure Laterality Date  . Kidney transplant  04/2009  . Av fistula placement      Left  . Incisional hernia repair   04/27/2012    Procedure: LAPAROSCOPIC INCISIONAL HERNIA;  Surgeon: [name], MD;  Location: [place];  Service: General;  Laterality: N/A;  . Insertion of mesh  04/27/2012    Procedure: INSERTION OF MESH;  Surgeon: [name], MD;  Location: [place];  Service: General;  Laterality: N/A;  . Hernia repair  01/2009    primary ventral hernia repair x2   . Appendectomy  01/2009  . Capd insertion N/A 08/03/2013    Procedure: LAPAROSCOPIC EXPLORATION OF THE ABDOMEN WITH PLACEMENT OF PERITONEAL DIALYSIS CATHETER;  Surgeon: Adin Hector, MD;  Location: [place];  Service: General;  Laterality: N/A;  . Capd insertion N/A 09/06/2013    Procedure: DIAGNOSTIC LAPAROSCOPY WITH  REPOSITIONING OF CONTINUOUS AMBULATORY PERITONEAL DIALYSIS  (CAPD) CATHETER ;  Surgeon: Adin Hector, MD;  Location: [place];  Service: General;  Laterality: N/A;  . Capd removal N/A 02/08/2014    Procedure: REMOVAL OF PERITONEAL DIALYSIS CATHETER;  Surgeon: Michael Boston, MD;  [facility]: [facility];  Service: General;  Laterality: N/A;   Family History  Problem Relation Age of Onset  . Pancreatic cancer Maternal Grandmother   . Colon cancer Neg Hx   . Colon polyps Neg  Hx   . Rectal cancer Neg Hx   . Stomach cancer Neg Hx    Social History  Substance Use Topics  . Smoking status: Former Smoker -- 0.25 packs/day for 20 years    Types: Cigarettes  . Smokeless tobacco: Never Used     Comment: social smoker   . Alcohol Use: 0.0 oz/week    0 Standard drinks or equivalent per week     Comment: minimal-rare     Review of Systems  Constitutional: Negative for fever.  Respiratory: Negative for shortness of breath.   Cardiovascular: Negative for chest pain.  Gastrointestinal: Negative for vomiting.  Musculoskeletal: Negative for neck pain.  Skin: Positive for rash.  Neurological: Positive for speech difficulty. Negative for dizziness, weakness, numbness and headaches.  Psychiatric/Behavioral: Positive for hallucinations and  confusion.  All other systems reviewed and are negative.     Allergies  Tape  Home Medications   Prior to Admission medications   Medication Sig Start Date End Date Taking? Authorizing Provider  calcium carbonate (CALCIUM ANTACID EXTRA STRENGTH) 750 MG chewable tablet Chew 750 mg by mouth. Reported on 10/13/2015    Historical Provider, MD  calcium carbonate (OS-CAL) 600 MG TABS tablet Take 600 mg by mouth 3 (three) times daily with meals as needed. Reported on 10/13/2015    Historical Provider, MD  SENSIPAR 60 MG tablet Take 1 tablet by mouth daily. 07/07/14   Historical Provider, MD  sevelamer carbonate (RENVELA) 800 MG tablet Take 2,400 mg by mouth 3 (three) times daily with meals. Reported on 10/13/2015    Historical Provider, MD  traMADol (ULTRAM) 50 MG tablet Take 1 tablet (50 mg total) by mouth every 12 (twelve) hours as needed. 10/13/15   Melvenia Beam, MD  valACYclovir (VALTREX) 1000 MG tablet Take 1 tablet (1,000 mg total) by mouth 3 (three) times daily. 10/13/15   Robyn Haber, MD   BP 175/99 mmHg  Pulse 69  Resp 18  Ht 6\' 5"  (1.956 m)  Wt 280 lb (127.007 kg)  BMI 33.20 kg/m2  SpO2 99% Physical Exam  Constitutional: He is oriented to person, place, and time. He appears well-developed and well-nourished.  HENT:  Head: Normocephalic and atraumatic.  Right Ear: External ear normal.  Left Ear: External ear normal.  Nose: Nose normal.  Eyes: EOM are normal. Pupils are equal, round, and reactive to light. Right eye exhibits no discharge. Left eye exhibits no discharge.  Neck: Neck supple.  Cardiovascular: Normal rate, regular rhythm, normal heart sounds and intact distal pulses.   Pulmonary/Chest: Effort normal and breath sounds normal.  Abdominal: Soft. There is no tenderness.  Musculoskeletal: He exhibits no edema.  Neurological: He is alert and oriented to person, place, and time.  Awake, alert and oriented. Intermittent slurred speech. CN 2-12 grossly intact. 5/5 strength  in all 4 extremities. Grossly normal sensation. Normal finger to nose. Able to ambulate but states he feels unsteady.  Skin: Skin is warm and dry. Rash noted. Rash is vesicular.     Nursing note and vitals reviewed.   ED Course  Procedures (including critical care time) Labs Review Labs Reviewed  CBC - Abnormal; Notable for the following:    RBC 3.22 (*)    Hemoglobin 11.1 (*)    HCT 33.9 (*)    MCV 105.3 (*)    MCH 34.5 (*)    All other components within normal limits  DIFFERENTIAL - Abnormal; Notable for the following:    Lymphs Abs 0.6 (*)  All other components within normal limits  COMPREHENSIVE METABOLIC PANEL - Abnormal; Notable for the following:    Potassium 6.6 (*)    Chloride 95 (*)    CO2 21 (*)    BUN 88 (*)    Creatinine, Ser 12.72 (*)    Albumin 3.2 (*)    GFR calc non Af Amer 4 (*)    GFR calc Af Amer 5 (*)    Anion gap 22 (*)    All other components within normal limits  I-STAT CHEM 8, ED - Abnormal; Notable for the following:    Potassium 6.4 (*)    Chloride 99 (*)    BUN 94 (*)    Creatinine, Ser 12.40 (*)    Hemoglobin 11.9 (*)    HCT 35.0 (*)    All other components within normal limits  ETHANOL  PROTIME-INR  APTT  URINE RAPID DRUG SCREEN, HOSP PERFORMED  URINALYSIS, ROUTINE W REFLEX MICROSCOPIC (NOT AT [facility])  I-STAT TROPOININ, ED    Imaging Review Ct Head Wo Contrast  10/15/2015  CLINICAL DATA:  Patient started medications for shingles 2 days ago. Difficulty walking and left leg weakness. EXAM: CT HEAD WITHOUT CONTRAST TECHNIQUE: Contiguous axial images were obtained from the base of the skull through the vertex without intravenous contrast. COMPARISON:  None. FINDINGS: Brain: No evidence of acute infarction, hemorrhage, extra-axial collection, ventriculomegaly, or mass effect. Vascular: No hyperdense vessel or unexpected calcification. Skull: Negative for fracture or focal lesion. Sinuses/Orbits: No acute findings. Other: None. IMPRESSION: No  acute abnormalities. Electronically Signed   By: Dorise Bullion III M.D   On: 10/15/2015 13:00   I have personally reviewed and evaluated these images and lab results as part of my medical decision-making.   EKG Interpretation   Date/Time:  Sunday Oct 15 2015 12:00:40 EDT Ventricular Rate:  60 PR Interval:  191 QRS Duration: 104 QT Interval:  440 QTC Calculation: 440 R Axis:   24 Text Interpretation:  Sinus rhythm Low voltage, precordial leads  Anteroseptal infarct, old T waves a little more peaked comapred to 2015  Confirmed by Amiaya Mcneeley MD, Mcclain Shall [id number]) on 10/15/2015 1:19:04 PM      MDM   Final diagnoses:  Adverse reaction to antiviral drugs, initial encounter  Hyperkalemia    Patient's symptoms started last night. No focal symptoms and his symptoms seem to be waxing and waning. Discussed with neurology and this seems consistent with too high a dosage of his valacyclovir. Discussed with nephrology, Dr. Jonnie Finner, no indication for emergent dialysis to remove the valacyclovir. He is noted to have a potassium of 6.6 with mildly peaked T waves. Dr. Jonnie Finner will come see patient but not dialyze today. Asks for 30 g kayexalate PO. Will need to be observed in hospital. Will admit to family practice.    Sherwood Gambler, MD 10/15/15 [phone]

## 2015-10-15 NOTE — ED Notes (Signed)
Attempted to call report

## 2015-10-15 NOTE — ED Notes (Signed)
Pt reports difficulty walking with slurred speech and some mild hallucinations beginning yesterday and worsening overnight.  Pt's spouse reports labile moods.  Pt reports beginning tramadol and anti-viral Friday afternoon for shingles.  Rash noted to LLE.    Pt reports taking last dose 0030 today. No focal deficits noted, slurred speech noted.

## 2015-10-15 NOTE — ED Notes (Signed)
Pt given a walker per request due to difficulty walking to ambulate to restroom. Pt also requested something to eat. OK per RN Becky. Pt given a [place] sandwich, apple sauce and cranberry juice per request.

## 2015-10-16 DIAGNOSIS — N186 End stage renal disease: Secondary | ICD-10-CM | POA: Diagnosis not present

## 2015-10-16 DIAGNOSIS — T375X5A Adverse effect of antiviral drugs, initial encounter: Secondary | ICD-10-CM | POA: Diagnosis not present

## 2015-10-16 DIAGNOSIS — E875 Hyperkalemia: Secondary | ICD-10-CM | POA: Diagnosis not present

## 2015-10-16 DIAGNOSIS — G92 Toxic encephalopathy: Secondary | ICD-10-CM | POA: Diagnosis not present

## 2015-10-16 LAB — RENAL FUNCTION PANEL
ANION GAP: 22 — AB (ref 5–15)
Albumin: 3.2 g/dL — ABNORMAL LOW (ref 3.5–5.0)
BUN: 101 mg/dL — AB (ref 6–20)
CHLORIDE: 95 mmol/L — AB (ref 101–111)
CO2: 20 mmol/L — ABNORMAL LOW (ref 22–32)
Calcium: 9.1 mg/dL (ref 8.9–10.3)
Creatinine, Ser: 14.56 mg/dL — ABNORMAL HIGH (ref 0.61–1.24)
GFR calc Af Amer: 4 mL/min — ABNORMAL LOW (ref >60)
GFR calc non Af Amer: 3 mL/min — ABNORMAL LOW (ref >60)
GLUCOSE: 88 mg/dL (ref 65–99)
POTASSIUM: 5.6 mmol/L — AB (ref 3.5–5.1)
Phosphorus: 8.2 mg/dL — ABNORMAL HIGH (ref 2.5–4.6)
Sodium: 137 mmol/L (ref 135–145)

## 2015-10-16 LAB — CBC
HEMATOCRIT: 29.9 % — AB (ref 39.0–52.0)
HEMOGLOBIN: 9.7 g/dL — AB (ref 13.0–17.0)
MCH: 33.7 pg (ref 26.0–34.0)
MCHC: 32.4 g/dL (ref 30.0–36.0)
MCV: 103.8 fL — AB (ref 78.0–100.0)
Platelets: 192 10*3/uL (ref 150–400)
RBC: 2.88 MIL/uL — ABNORMAL LOW (ref 4.22–5.81)
RDW: 14.4 % (ref 11.5–15.5)
WBC: 7.2 10*3/uL (ref 4.0–10.5)

## 2015-10-16 LAB — GLUCOSE, CAPILLARY: Glucose-Capillary: 87 mg/dL (ref 65–99)

## 2015-10-16 MED ORDER — DARBEPOETIN ALFA 60 MCG/0.3ML IJ SOSY: 60.0000 ug | PREFILLED_SYRINGE | INTRAMUSCULAR | Status: DC

## 2015-10-16 MED ORDER — DOXERCALCIFEROL 4 MCG/2ML IV SOLN: 11.0000 ug | INTRAVENOUS | Status: DC

## 2015-10-16 NOTE — Progress Notes (Signed)
Family Medicine Teaching Service Daily Progress Note Intern Pager: [phone]  Patient name: Mario Woods Medical record number: [id number] Date of birth: [birth year] Age: 55 y.o. Gender: male  Primary Care Provider: Robyn Haber, MD Consultants: Nephro Code Status: Full   Pt Overview and Major Events to Date:  5/7: Admitted to [facility] with TME  Assessment and Plan: Mario Woods is a 55 y.o. male presenting with Toxic Metabolic Encephalopathy . 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   # Toxic Metabolic Encephalopathy - Most likely 2/2 high dose valacyclovir in ESRD patient. He has been taking 1000mg  TID. Renally adjusted should be 500mg  qd. Has been off the medication since admission. Still having hallucinations this morning but AO x 3. TSH, B12, Folate WNL. Magnesium up at 2.8, phos up at 8.2.  - Neuro checks q4 hours.  - CT head in the ED normal, consider MRI if not clearing and especially if focal neurological deficits develop.  - Serum Drug Screen - Hold home tramadol  # ESRD / Hyperkalemia - pt. With known ESRD 2/2 IGA nephropathy. S/p failed txp. Followed by Dr. Lorrene Reid. K - 6.4 in the E, given Ca Gluconate and Insulin in the ED.  K now 5.6. Dialysis schedule MWF. EKG with notable t-wave changes on admission.  - Dr. Melvia Heaps to see per ED physician.  - Dialysis today - renal function panel tomorrow - Continue home Renvela, Sensipar, Os-Cal  # HTN - pt. Usually normotensive. Likely related to agitation / confusion. Developed HTN to 123XX123 - 123XX123 systolic in the ED. Now normotensive - Tx with labetalol 10mg  prn SBP > 170 / 110.  - Continue to follow.   # Zoster - atypical dermotomal distribution. Has had chronic radicular pain in this area, but developed L3/L4 dermotomal rash similar to zoster. Had pain > itching and then rash. It is now crusted over  reducing infectious risk. Itching / Pain is mostly gone now. He is high risk given ESRD.  - Holding valacyclovir at this point.  - consider PCR Zoster.   # Macrocytic Anemia - Hgb 9.7. Macrocytosis at last check 2015.  B12, Folate WNL.  - Monitor   # Peripheral Neuropathy - related to ESRD. Uses tramadol for this.  - Holding tramadol for now.   # Lumbosacral Arthrosis - some mild degenerative changes on MRI 05/2015. No nerve root compression. Had been bothering him lately, however he then developed shingles in the affected area as noted above.  - Holding tramadol.   FEN/GI: Protonix  Prophylaxis: Heparin  Disposition: Continue to monitor until mental status improves  Subjective:  Patient still hallucinating this morning. States he sees "rainbows flashing around".   Objective: Temp:  [97.7 F (36.5 C)-98 F (36.7 C)] 97.9 F (36.6 C) (05/08 0651) Pulse Rate:  [59-86] 86 (05/08 0651) Resp:  [14-20] 18 (05/08 0651) BP: (150-185)/(98-124) 166/100 mmHg (05/08 0651) SpO2:  [95 %-100 %] 99 % (05/08 0651) Weight:  [280 lb (127.007 kg)-283 lb 15.2 oz (128.8 kg)] 283 lb 15.2 oz (128.8 kg) (05/08 0343) Physical Exam: General: NAD, sitting up in bed.  Cardiovascular: RRR, no MGR, normal S1/S2, 2+ distal pulses.  Respiratory: CTA Bilaterally, appropriate rate, unlabored Abdomen: soft, non distended, normal bowel sounds Extremities: no edema Neuro: CN II-XII tested and in tact, AAOx3, 5/5 motor in all major muscle groups, No sensory deficits, no cerebellar deficits. Psych: + visual hallucinations  Laboratory:  Recent Labs Lab 10/15/15 1151 10/15/15 1215 10/15/15 1847 10/16/15 0457  WBC 6.4  --  7.4 7.2  HGB 11.1* 11.9* 11.0* 9.7*  HCT 33.9* 35.0* 32.7* 29.9*  PLT 180  --  206 192    Recent Labs Lab 10/15/15 1151 10/15/15 1215 10/15/15 1847 10/16/15 0457  NA 138 135  --  137  K 6.6* 6.4*  --  5.6*  CL 95* 99*  --  95*  CO2 21*  --   --  20*  BUN 88* 94*  --   101*  CREATININE 12.72* 12.40* 13.35* 14.56*  CALCIUM 10.0  --   --  9.1  PROT 6.7  --   --   --   BILITOT 0.8  --   --   --   ALKPHOS 108  --   --   --   ALT 22  --   --   --   AST 16  --   --   --   GLUCOSE 94 91  --  88    Imaging/Diagnostic Tests: No results found.   Carlyle Dolly, MD 10/16/2015, 7:21 AM PGY-1, [facility] Intern pager: [phone], text pages welcome

## 2015-10-16 NOTE — Progress Notes (Signed)
Patient's wife told Probation officer that patient now wants to be dialyzed. Dialysis called to be notified. Patient's wife made aware

## 2015-10-16 NOTE — Progress Notes (Signed)
Called in by the dialysis nurse that patient is agitated and does not want to be dialyzed. Writer went in tried to calm patient down. Patient seemed to be confused and does not know he is in the hospital. Patient does not have any medications ordered for anxiety. MD paged. Awaiting reply.

## 2015-10-16 NOTE — Progress Notes (Signed)
Subjective: " I am not thinking right / will do Hemo"  Objective Vital signs in last 24 hours: Filed Vitals:   10/15/15 2115 10/16/15 0238 10/16/15 0343 10/16/15 0651  BP: 168/109 174/100  166/100  Pulse: 84 86  86  Temp: 98 F (36.7 C) 97.7 F (36.5 C)  97.9 F (36.6 C)  TempSrc: Oral Oral  Oral  Resp: 20 18  18   Height:      Weight:   128.8 kg (283 lb 15.2 oz)   SpO2: 99% 95%  99%   Weight change:   Physical Exam: General:  Adult WM alert /confused pleasantly , NAD Heart: RRR, no rub or Mur  Lungs: CTA  Abdomen:  Soft NT, ND  Extremities: No pedal edema/ R lower leg bandages not removed  Dialysis Access: Pos bruit LFA AVF    OP Dialysis: [facility] MWF  4.5 hrs/ edw 125 kg , 2k, 2 ca bath / hep 12,000 load. 2,00 interm  Mid tx/ Mircera  50mg c q 2weeks last given 10/04/15 / Hec. 11 q hd Problem/Plan: 1. Altered mental status/ ataxic gait/ hallucinations - likely due to overRx with valacyclovir =known to cause these symptoms in  ESRD Patient  if not dose properly. Proper dosing for ESRD is 500 mg once daily.  2. ESRD on HD MWF 3. Hyperkalemia - given kayexalate in ED 6.4 > 5.6 this am  HD pending  4. Volume no gross vol overload 5. HTN off meds now 6. 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 Anemia Of ESRD-  HGb 9.7 esa on hd  Due this wed     Ernest Haber, PA-C [place] [phone] 10/16/2015,10:05 AM    Labs: Basic Metabolic Panel:  Recent Labs Lab 10/15/15 1151 10/15/15 1215 10/15/15 1847 10/16/15 0457  NA 138 135  --  137  K 6.6* 6.4*  --  5.6*  CL 95* 99*  --  95*  CO2 21*  --   --  20*  GLUCOSE 94 91  --  88  BUN 88* 94*  --  101*  CREATININE 12.72* 12.40* 13.35* 14.56*  CALCIUM 10.0  --   --  9.1  PHOS  --   --  6.9* 8.2*   Liver Function Tests:  Recent Labs Lab 10/15/15 1151 10/16/15 0457  AST 16  --   ALT 22  --   ALKPHOS 108  --   BILITOT 0.8  --   PROT 6.7  --   ALBUMIN 3.2* 3.2*    CBC:  Recent Labs Lab 10/15/15 1151 10/15/15 1215 10/15/15 1847 10/16/15 0457  WBC 6.4  --  7.4 7.2  NEUTROABS 4.8  --   --   --   HGB 11.1* 11.9* 11.0* 9.7*  HCT 33.9* 35.0* 32.7* 29.9*  MCV 105.3*  --  105.8* 103.8*  PLT 180  --  206 192   CBG:  Recent Labs Lab 10/16/15 0647  GLUCAP 87  Medications:   . calcium carbonate  1 tablet Oral TID WC  . cinacalcet  60 mg Oral Q supper  . dextrose   Intravenous Once  . heparin  5,000 Units Subcutaneous Q8H  . insulin aspart  5 Units Intravenous Once  . pantoprazole  20 mg Oral Daily  . sevelamer carbonate  2,400 mg Oral TID WC  . sodium chloride flush  3 mL Intravenous Q12H  . sodium chloride flush  3 mL Intravenous Q12H

## 2015-10-16 NOTE — Care Management Note (Signed)
Case Management Note  Patient Details  Name: Mario Woods MRN: [id number] Date of Birth: Jul 26, 1960  Subjective/Objective:     Pt admitted with confusion and hyperkalemia. He recently started on acyclovir for shingles. He is from home with his wife.               Action/Plan: Continuing medical work up. CM following for discharge needs.   Expected Discharge Date:                  Expected Discharge Plan:     In-House Referral:     Discharge planning Services     Post Acute Care Choice:    Choice offered to:     DME Arranged:    DME Agency:     HH Arranged:    HH Agency:     Status of Service:  In process, will continue to follow  Medicare Important Message Given:    Date Medicare IM Given:    Medicare IM give by:    Date Additional Medicare IM Given:    Additional Medicare Important Message give by:     If discussed at [place] of Stay Meetings, dates discussed:    Additional Comments:  Pollie Friar, RN 10/16/2015, 1:57 PM

## 2015-10-16 NOTE — Progress Notes (Signed)
Refused HD tx x3, Pt stated " Not right now, I will tell you later".HD charge nurse aware and nephrologist has been informed about the situation. Pt alert in no apparent distress.

## 2015-10-17 ENCOUNTER — Encounter (HOSPITAL_COMMUNITY): Payer: Self-pay | Admitting: General Practice

## 2015-10-17 DIAGNOSIS — G92 Toxic encephalopathy: Secondary | ICD-10-CM

## 2015-10-17 DIAGNOSIS — N186 End stage renal disease: Secondary | ICD-10-CM

## 2015-10-17 DIAGNOSIS — Z992 Dependence on renal dialysis: Secondary | ICD-10-CM

## 2015-10-17 DIAGNOSIS — T375X5A Adverse effect of antiviral drugs, initial encounter: Secondary | ICD-10-CM | POA: Insufficient documentation

## 2015-10-17 DIAGNOSIS — E875 Hyperkalemia: Secondary | ICD-10-CM

## 2015-10-17 LAB — CBC
HCT: 32.1 % — ABNORMAL LOW (ref 39.0–52.0)
Hemoglobin: 10.6 g/dL — ABNORMAL LOW (ref 13.0–17.0)
MCH: 34.6 pg — ABNORMAL HIGH (ref 26.0–34.0)
MCHC: 33 g/dL (ref 30.0–36.0)
MCV: 104.9 fL — AB (ref 78.0–100.0)
PLATELETS: 218 10*3/uL (ref 150–400)
RBC: 3.06 MIL/uL — ABNORMAL LOW (ref 4.22–5.81)
RDW: 14.7 % (ref 11.5–15.5)
WBC: 6.3 10*3/uL (ref 4.0–10.5)

## 2015-10-17 LAB — RENAL FUNCTION PANEL
ALBUMIN: 3.6 g/dL (ref 3.5–5.0)
Anion gap: 19 — ABNORMAL HIGH (ref 5–15)
BUN: 51 mg/dL — AB (ref 6–20)
CALCIUM: 9.7 mg/dL (ref 8.9–10.3)
CO2: 23 mmol/L (ref 22–32)
CREATININE: 9.91 mg/dL — AB (ref 0.61–1.24)
Chloride: 95 mmol/L — ABNORMAL LOW (ref 101–111)
GFR calc Af Amer: 6 mL/min — ABNORMAL LOW (ref >60)
GFR, EST NON AFRICAN AMERICAN: 5 mL/min — AB (ref >60)
Glucose, Bld: 98 mg/dL (ref 65–99)
PHOSPHORUS: 8.4 mg/dL — AB (ref 2.5–4.6)
Potassium: 5.3 mmol/L — ABNORMAL HIGH (ref 3.5–5.1)
SODIUM: 137 mmol/L (ref 135–145)

## 2015-10-17 NOTE — Progress Notes (Signed)
Patient is being d/c home. D/c instructions given and patient verbalized understanding. 

## 2015-10-17 NOTE — Progress Notes (Signed)
Patient walked out of the unit with family. Refused to be wheeled out by the hospital staff.

## 2015-10-17 NOTE — Discharge Instructions (Signed)
It has been a pleasure taking care of you! You were admitted due to confusion, which was likely due to Valtrex toxicity. You were on higher dose of Valtrex than recommended for you. Your symptoms improved after dialysis. We are discharging you to follow up with your primary care doctor. We have discontinued your Valtrex for now. You may start this in future at low dose after discussing with your primary care doctor. There could be some other changes made to your home medications during this hospitalization. Please, make sure to read the directions before you take them. The names and directions on how to take these medications are found on this discharge paper under medication section.  Please call your primary care doctor office as soon as possible for hospital follow up.  Take care,

## 2015-10-17 NOTE — Care Management Obs Status (Signed)
[facility] NOTIFICATION   Patient Details  Name: LYNNE VALLAS MRN: [id number] Date of Birth: 07-11-1960   Medicare Observation Status Notification Given:  Yes    Pollie Friar, RN 10/17/2015, 10:29 AM

## 2015-10-17 NOTE — Progress Notes (Signed)
Patient's wife wanted to speak with the MD. MD paged to notify. Received a call back from the MD, who said will be in to see her as soon as he can. Patient made aware

## 2015-10-17 NOTE — Evaluation (Signed)
Physical Therapy Evaluation and DISCHARGE Patient Details Name: Mario Woods MRN: [id number] DOB: Feb 06, 1961 Today's Date: 10/17/2015   History of Present Illness  Mario Woods is a 55 y.o. male presenting with Toxic Metabolic Encephalopathy . 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  Clinical Impression  Pt functioning at baseline. No apparent balance deficits and functioning at mod I. Pt with no further acute PT needs at this time. Please re-consult if needed in future. PT SIGNING OFF.    Follow Up Recommendations No PT follow up;Supervision - Intermittent    Equipment Recommendations  None recommended by PT    Recommendations for Other Services       Precautions / Restrictions Precautions Precautions: None Precaution Comments: airborne for precautions Restrictions Weight Bearing Restrictions: No      Mobility  Bed Mobility Overal bed mobility: Modified Independent                Transfers Overall transfer level: Modified independent               General transfer comment: modified indep with sit to stand, no episodes of LOB  Ambulation/Gait Ambulation/Gait assistance: Modified independent (Device/Increase time) Ambulation Distance (Feet): 20 Feet (limited to in room due to airborne precautions for shingles) Assistive device: None Gait Pattern/deviations: WFL(Within Functional Limits) Gait velocity: slow Gait velocity interpretation: Below normal speed for age/gender General Gait Details: no episodes of LOB  Stairs            Wheelchair Mobility    Modified Rankin (Stroke Patients Only)       Balance Overall balance assessment: No apparent balance deficits (not formally assessed)                                           Pertinent Vitals/Pain Pain Assessment: No/denies pain    Home  Living Family/patient expects to be discharged to:: Private residence Living Arrangements: Spouse/significant other Available Help at Discharge: Family;Available 24 hours/day Type of Home: House Home Access: Stairs to enter Entrance Stairs-Rails: None Entrance Stairs-Number of Steps: 3 Home Layout: Two level;Able to live on main level with bedroom/bathroom        Prior Function Level of Independence: Independent         Comments: 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[employer], goes to HD MWF     Hand Dominance        Extremity/Trunk Assessment   Upper Extremity Assessment: Overall WFL for tasks assessed           Lower Extremity Assessment: Overall WFL for tasks assessed (but does have history of perpheral neuropathy)      Cervical / Trunk Assessment: Normal  Communication   Communication: No difficulties  Cognition Arousal/Alertness: Awake/alert Behavior During Therapy: WFL for tasks assessed/performed Overall Cognitive Status: Within Functional Limits for tasks assessed                      General Comments General comments (skin integrity, edema, etc.): pt able to squat, perform SLS on R and L >10 sec and march in place without LOB or difficulty    Exercises        Assessment/Plan    PT Assessment Patent does not need any further PT services  PT  Diagnosis Generalized weakness   PT Problem List    PT Treatment Interventions     PT Goals (Current goals can be found in the Care Plan section) Acute Rehab PT Goals Patient Stated Goal: home asap PT Goal Formulation: All assessment and education complete, DC therapy    Frequency     Barriers to discharge        Co-evaluation               End of Session   Activity Tolerance: Patient tolerated treatment well Patient left: in chair;with call bell/phone within reach Nurse Communication: Mobility status    Functional Assessment Tool Used: clinical judgment Functional Limitation:  Mobility: Walking and moving around Mobility: Walking and Moving Around Current Status [id number]): At least 1 percent but less than 20 percent impaired, limited or restricted Mobility: Walking and Moving Around Goal Status [phone]): At least 1 percent but less than 20 percent impaired, limited or restricted Mobility: Walking and Moving Around Discharge Status [phone]): At least 1 percent but less than 20 percent impaired, limited or restricted    Time: 1345-1402 PT Time Calculation (min) (ACUTE ONLY): 17 min   Charges:   PT Evaluation $PT Eval Low Complexity: 1 Procedure     PT G Codes:   PT G-Codes **NOT FOR INPATIENT CLASS** Functional Assessment Tool Used: clinical judgment Functional Limitation: Mobility: Walking and moving around Mobility: Walking and Moving Around Current Status [id number]): At least 1 percent but less than 20 percent impaired, limited or restricted Mobility: Walking and Moving Around Goal Status [phone]): At least 1 percent but less than 20 percent impaired, limited or restricted Mobility: Walking and Moving Around Discharge Status [phone]): At least 1 percent but less than 20 percent impaired, limited or restricted    Mario Woods 10/17/2015, 2:13 PM   Kittie Plater, PT, DPT Pager #: [phone] Office #: [phone]

## 2015-10-17 NOTE — Progress Notes (Signed)
[facility] ROUNDING NOTE   Subjective:   Interval History:  Much better hoping to go home  Objective:  Vital signs in last 24 hours:  Temp:  [97.5 F (36.4 C)-98.9 F (37.2 C)] 98.3 F (36.8 C) (05/09 0959) Pulse Rate:  [69-101] 85 (05/09 0959) Resp:  [18-20] 18 (05/09 0959) BP: (122-203)/(71-107) 132/74 mmHg (05/09 0959) SpO2:  [94 %-99 %] 95 % (05/09 0959) Weight:  [124.2 kg (273 lb 13 oz)-128.1 kg (282 lb 6.6 oz)] 126.8 kg (279 lb 8.7 oz) (05/09 0600)  Weight change: 1.093 kg (2 lb 6.6 oz) Filed Weights   10/16/15 1416 10/16/15 1855 10/17/15 0600  Weight: 128.1 kg (282 lb 6.6 oz) 124.2 kg (273 lb 13 oz) 126.8 kg (279 lb 8.7 oz)    Intake/Output: I/O last 3 completed shifts: In: 120 [P.O.:120] Out: 3781 [Other:3781]   Intake/Output this shift:  Total I/O In: 240 [P.O.:240] Out: -   General: Adult WM alert /confused pleasantly , NAD Heart: RRR, no rub or Mur  Lungs: CTA  Abdomen: Soft NT, ND  Extremities: No pedal edema/ R lower leg bandages not removed Dialysis Access: Pos bruit LFA AVF    Basic Metabolic Panel:  Recent Labs Lab 10/15/15 1151 10/15/15 1215 10/15/15 1847 10/16/15 0457 10/17/15 0517  NA 138 135  --  137 137  K 6.6* 6.4*  --  5.6* 5.3*  CL 95* 99*  --  95* 95*  CO2 21*  --   --  20* 23  GLUCOSE 94 91  --  88 98  BUN 88* 94*  --  101* 51*  CREATININE 12.72* 12.40* 13.35* 14.56* 9.91*  CALCIUM 10.0  --   --  9.1 9.7  MG  --   --  2.8*  --   --   PHOS  --   --  6.9* 8.2* 8.4*    Liver Function Tests:  Recent Labs Lab 10/15/15 1151 10/16/15 0457 10/17/15 0517  AST 16  --   --   ALT 22  --   --   ALKPHOS 108  --   --   BILITOT 0.8  --   --   PROT 6.7  --   --   ALBUMIN 3.2* 3.2* 3.6   No results for input(s): LIPASE, AMYLASE in the last 168 hours. No results for input(s): AMMONIA in the last 168 hours.  CBC:  Recent Labs Lab 10/15/15 1151 10/15/15 1215 10/15/15 1847 10/16/15 0457 10/17/15 0517   WBC 6.4  --  7.4 7.2 6.3  NEUTROABS 4.8  --   --   --   --   HGB 11.1* 11.9* 11.0* 9.7* 10.6*  HCT 33.9* 35.0* 32.7* 29.9* 32.1*  MCV 105.3*  --  105.8* 103.8* 104.9*  PLT 180  --  206 192 218    Cardiac Enzymes: No results for input(s): CKTOTAL, CKMB, CKMBINDEX, TROPONINI in the last 168 hours.  BNP: Invalid input(s): POCBNP  CBG:  Recent Labs Lab 10/16/15 0647  GLUCAP 87    Microbiology: Results for orders placed or performed during the hospital encounter of 09/06/13  Anaerobic culture     Status: None   Collection Time: 09/06/13  8:26 AM  Result Value Ref Range Status   Specimen Description TISSUE  Final   Special Requests PERITONEAL TUBE CLOT  Final   Gram Stain   Final    NO WBC SEEN NO ORGANISMS SEEN Performed at [facility]   Final  NO ANAEROBES ISOLATED Performed at [facility]   Report Status 09/11/2013 FINAL  Final  Tissue culture     Status: None   Collection Time: 09/06/13  8:26 AM  Result Value Ref Range Status   Specimen Description TISSUE  Final   Special Requests PERITONEAL CLOT TUBE  Final   Gram Stain   Final    NO WBC SEEN NO ORGANISMS SEEN Performed at [facility]   Culture   Final    NO GROWTH 3 DAYS Performed at [facility]   Report Status 09/09/2013 FINAL  Final    Coagulation Studies:  Recent Labs  10/15/15 1151  LABPROT 13.2  INR 0.98    Urinalysis: No results for input(s): COLORURINE, LABSPEC, PHURINE, GLUCOSEU, HGBUR, BILIRUBINUR, KETONESUR, PROTEINUR, UROBILINOGEN, NITRITE, LEUKOCYTESUR in the last 72 hours.  Invalid input(s): APPERANCEUR    Imaging: Ct Head Wo Contrast  10/15/2015  CLINICAL DATA:  Patient started medications for shingles 2 days ago. Difficulty walking and left leg weakness. EXAM: CT HEAD WITHOUT CONTRAST TECHNIQUE: Contiguous axial images were obtained from the base of the skull through the vertex without intravenous contrast. COMPARISON:  None. FINDINGS:  Brain: No evidence of acute infarction, hemorrhage, extra-axial collection, ventriculomegaly, or mass effect. Vascular: No hyperdense vessel or unexpected calcification. Skull: Negative for fracture or focal lesion. Sinuses/Orbits: No acute findings. Other: None. IMPRESSION: No acute abnormalities. Electronically Signed   By: Dorise Bullion III M.D   On: 10/15/2015 13:00     Medications:     . calcium carbonate  1 tablet Oral TID WC  . cinacalcet  60 mg Oral Q supper  . [START ON 10/18/2015] darbepoetin (ARANESP) injection - DIALYSIS  60 mcg Intravenous Q Wed-HD  . dextrose   Intravenous Once  . [START ON 10/18/2015] doxercalciferol  11 mcg Intravenous Q M,W,F-HD  . heparin  5,000 Units Subcutaneous Q8H  . insulin aspart  5 Units Intravenous Once  . pantoprazole  20 mg Oral Daily  . sevelamer carbonate  2,400 mg Oral TID WC  . sodium chloride flush  3 mL Intravenous Q12H  . sodium chloride flush  3 mL Intravenous Q12H   sodium chloride, acetaminophen **OR** acetaminophen, labetalol, sodium chloride flush  Assessment/ Plan:  1. Altered mental status/ ataxic gait/ hallucinations - likely due to overRx with valacyclovir =known to cause these symptoms in ESRD Patient if not dose properly. Proper dosing for ESRD is 500 mg once daily.  2. ESRD on HD MWF 3. Hyperkalemia -  K 5.3 4. Volume no gross vol overload 5. 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 Anemia Of ESRD- HGb 9.7 esa on hd Due this wed  Hoping to go home today and I  See no reason for him not to get outpatient dialysis in AM    LOS:  Bryan Goin W @TODAY @10 :44 AM

## 2015-10-17 NOTE — Progress Notes (Signed)
Family Medicine Teaching Service Daily Progress Note Intern Pager: [phone]  Patient name: Mario Woods Medical record number: [id number] Date of birth: [birth year] Age: 55 y.o. Gender: male  Primary Care Provider: Robyn Haber, MD Consultants: Nephro Code Status: Full   Pt Overview and Major Events to Date:  5/7: Admitted to [facility] with TME  Assessment and Plan: Mario Woods is a 55 y.o. male presenting with Toxic Metabolic Encephalopathy . 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   # Toxic Metabolic Encephalopathy - resolved. Likely 2/2 high dose valacyclovir in ESRD patient. He has been taking 1000mg  TID. Renally adjusted should be 500mg  qd. Has been off the medication since admission. TSH, B12, folate WNL. Mg 2.8, phos up at 8.2. BUN 101>51. CT head normal. He is alert and oriented today -Serum drug screen in process - Hold home tramadol  # ESRD - pt. With known ESRD 2/2 IGA nephropathy. S/p failed txp. Followed by Dr. Lorrene Reid. Dialysis schedule MWF. EKG with notable t-wave changes on admission. Had HD on 5/8. Renal function improved with this. - Continue home Renvela, Sensipar, Os-Cal  # Hyperkalemia: improved. K - 6.4 in the E, given Ca Gluconate and Insulin in the ED. K 5.3 this morning. -Renal function  # Hyperphosphatemia: P elevated to 8.4. Ca 9.7 -continue home sevelmer.   # HTN -normotensive. Hypertensive to 123XX123 - 123XX123 systolic in the ED. - Tx with labetalol 10mg  prn SBP > 170 / 110.   # Zoster - crusted scattered lesions with erythematous base in L3/L4 dermotome on left leg likely zoster - Holding valacyclovir at this point.   # Macrocytic Anemia - stable. Hgb 9.7>10.6. Macrocytosis at last check 2015. B12, Folate WNL.  - Monitor   # Peripheral Neuropathy - related to ESRD. Uses tramadol for this.  - Holding tramadol for now.    FEN/GI: Protonix   Prophylaxis: Heparin  Disposition: discharge home today  Subjective:  Reports having a good night sleep last night. No complaint today. Confusion resolved. He is alert and oriented and ready to go home.  Objective: Temp:  [97.5 F (36.4 C)-98.9 F (37.2 C)] 98.9 F (37.2 C) (05/09 0540) Pulse Rate:  [69-101] 69 (05/09 0540) Resp:  [18-20] 18 (05/09 0540) BP: (122-203)/(71-107) 137/82 mmHg (05/09 0540) SpO2:  [94 %-99 %] 98 % (05/09 0540) Weight:  [273 lb 13 oz (124.2 kg)-282 lb 6.6 oz (128.1 kg)] 279 lb 8.7 oz (126.8 kg) (05/09 0600) Physical Exam: GEN: lying in bed, appears well, NAD CVS: regular rate and rythm RESP: no increased work of breathing, GI: normal bowel sound, soft, non-tender,non-distended NEURO: alert and oriented x4, no gross defecits  Skin: crusted scattered lesions with erythematous base in L3/L4 dermotome on left leg likely zoster. Scaly dry raised lesion on his right thigh about a size of a nickel that has been there for long time per patient. PSYCH: good affect Laboratory:  Recent Labs Lab 10/15/15 1847 10/16/15 0457 10/17/15 0517  WBC 7.4 7.2 6.3  HGB 11.0* 9.7* 10.6*  HCT 32.7* 29.9* 32.1*  PLT 206 192 218    Recent Labs Lab 10/15/15 1151 10/15/15 1215 10/15/15 1847 10/16/15 0457 10/17/15 0517  NA 138 135  --  137 137  K 6.6* 6.4*  --  5.6* 5.3*  CL 95* 99*  --  95* 95*  CO2 21*  --   --  20* 23  BUN 88* 94*  --  101* 51*  CREATININE 12.72* 12.40* 13.35* 14.56* 9.91*  CALCIUM 10.0  --   --  9.1 9.7  PROT 6.7  --   --   --   --   BILITOT 0.8  --   --   --   --   ALKPHOS 108  --   --   --   --   ALT 22  --   --   --   --   AST 16  --   --   --   --   GLUCOSE 94 91  --  88 98    Imaging/Diagnostic Tests: No results found.  Mercy Riding, MD 10/17/2015, 8:32 AM PGY-1, [place] Intern pager: [phone], text pages welcome

## 2015-10-17 NOTE — Care Management Important Message (Signed)
Important Message  Patient Details  Name: Mario Woods MRN: [id number] Date of Birth: Jun 07, 1961   Medicare Important Message Given:  Yes    Pollie Friar, RN 10/17/2015, 11:44 AM

## 2015-10-17 NOTE — Care Management Note (Signed)
Case Management Note  Patient Details  Name: [name] CRISANTO MRN: [id number] Date of Birth: 08-Nov-1960  Subjective/Objective:                    Action/Plan: Pt discharging home with self care. No further needs per CM.   Expected Discharge Date:  10/17/15               Expected Discharge Plan:  Home/Self Care  In-House Referral:     Discharge planning Services  CM Consult  Post Acute Care Choice:    Choice offered to:     DME Arranged:    DME Agency:     HH Arranged:    HH Agency:     Status of Service:  Completed, signed off  Medicare Important Message Given:  Yes Date Medicare IM Given:    Medicare IM give by:    Date Additional Medicare IM Given:    Additional Medicare Important Message give by:     If discussed at [place] of Stay Meetings, dates discussed:    Additional Comments:  Pollie Friar, RN 10/17/2015, 2:33 PM

## 2015-10-18 ENCOUNTER — Ambulatory Visit: Payer: Self-pay | Admitting: Neurology

## 2015-10-18 LAB — DRUG SCREEN 10 W/CONF, SERUM
Amphetamines, IA: NEGATIVE ng/mL
BARBITURATES, IA: NEGATIVE ug/mL
BENZODIAZEPINES, IA: NEGATIVE ng/mL
COCAINE & METABOLITE, IA: NEGATIVE ng/mL
Methadone, IA: NEGATIVE ng/mL
OPIATES, IA: NEGATIVE ng/mL
Oxycodones, IA: NEGATIVE ng/mL
Phencyclidine, IA: NEGATIVE ng/mL
Propoxyphene, IA: NEGATIVE ng/mL
THC(MARIJUANA) METABOLITE, IA: NEGATIVE ng/mL

## 2015-10-18 NOTE — Discharge Summary (Signed)
[facility] Discharge Summary  Patient name: Mario Woods Medical record number: [id number] Date of birth: 28-Nov-1960 Age: 55 y.o. Gender: male Date of Admission: 10/15/2015  Date of Discharge: 10/17/2015 Admitting Physician: Dickie La, MD  Primary Care Provider: Robyn Haber, MD Consultants: nephrology  Indication for Hospitalization: AMS  Discharge Diagnoses/Problem List:  AMS ESRD HSV Disposition: home  Discharge Condition: stable  Discharge Exam:  Filed Vitals:   10/17/15 0600 10/17/15 0959 10/17/15 1122 10/17/15 1419  BP:  132/74  133/76  Pulse:  85  77  Temp:  98.3 F (36.8 C)  98.2 F (36.8 C)  TempSrc:  Oral  Oral  Resp:  18 16 18   Height:      Weight: 279 lb 8.7 oz (126.8 kg)     SpO2:  95% 97% 99%   GEN: lying in bed, appears well, NAD CVS: regular rate and rythm RESP: no increased work of breathing, GI: normal bowel sound, soft, non-tender,non-distended NEURO: alert and oriented x4, no gross defecits  Skin: crusted scattered lesions with erythematous base in L3/L4 dermotome on left leg likely zoster. Scaly dry raised lesion on his right thigh about a size of a nickel that has been there for long time per patient. PSYCH: good affect  Brief Hospital Course:  Mario Woods is a 55 y.o. male with history of recent dx of Shingles, ESRD secondary to IgA nephropathy s/p failed renal transplant, peripheral neuropathy who was admitted with acute encephalopathy likely due to Valtrex toxicity.   Toxic Metabolic Encephalopathy - resolved. Likely 2/2 high dose valacyclovir in ESRD patient. He was recentely started on 1000mg  three times a day. Renally adjusted should be 500mg  daily. Valtrex was discontinued on admission. CT without acute bleeding. TSH, B12, folate WNL. Mg 2.8, phos up at 8.2. BUN elevated to 101, and trended down to 51 after HD.  At the time of discharge, patient alert and oriented x4. He was evaluated by PT and didn't need  follow up PT. Serum drug screen was pending. Valtrex was discontinued.  ESRD secondary to IGA nephropathy. Failed transplant in the past. Followed by Dr. Lorrene Reid. Dialysis schedule MWF. EKG with notable t-wave changes on admission. Had HD on 5/8 with subsequent improvement in his renal function and electrolytes.  Hyperkalemia: improved. K - 6.4 in ED. Recieved Ca Gluconate and Insulin in the ED. K 5.3 after hemodialysis  Hyperphosphatemia: P elevated to 8.4. Ca 9.7. Discharged on home home Sevelamer and Lanthanum  Zoster - crusted scattered lesions with erythematous base in L3/L4 dermotome on left leg suggestive for zoster Started on Valtrex 1000 mg three times a day which led to encephalopathy. Discontinued Valtrex on admission and didn't start on discharge. The lesions appear crusted. Can resume if needed at renal dose.   Other chronic medical conditions were stable.  Issues for Follow Up:  1. Encephalopathy: resolved at discharge. 2. ESRD: HD MWF 3. HSV: discontinued Valtrex due to AMS. Can resume at renal dose if needed. Lesions have crusted at discharge.  Significant Procedures: HD  Significant Labs and Imaging:   Recent Labs Lab 10/15/15 1847 10/16/15 0457 10/17/15 0517  WBC 7.4 7.2 6.3  HGB 11.0* 9.7* 10.6*  HCT 32.7* 29.9* 32.1*  PLT 206 192 218    Recent Labs Lab 10/15/15 1151 10/15/15 1215 10/15/15 1847 10/16/15 0457 10/17/15 0517  NA 138 135  --  137 137  K 6.6* 6.4*  --  5.6* 5.3*  CL 95* 99*  --  95* 95*  CO2 21*  --   --  20* 23  GLUCOSE 94 91  --  88 98  BUN 88* 94*  --  101* 51*  CREATININE 12.72* 12.40* 13.35* 14.56* 9.91*  CALCIUM 10.0  --   --  9.1 9.7  MG  --   --  2.8*  --   --   PHOS  --   --  6.9* 8.2* 8.4*  ALKPHOS 108  --   --   --   --   AST 16  --   --   --   --   ALT 22  --   --   --   --   ALBUMIN 3.2*  --   --  3.2* 3.6    Results/Tests Pending at Time of Discharge: none  Discharge Medications:    Medication List    STOP  taking these medications        CALCIUM ANTACID EXTRA STRENGTH 750 MG chewable tablet  Generic drug:  calcium carbonate     valACYclovir 1000 MG tablet  Commonly known as:  VALTREX      TAKE these medications        calcium carbonate 600 MG Tabs tablet  Commonly known as:  OS-CAL  Take 600 mg by mouth 3 (three) times daily with meals as needed. Reported on 10/13/2015     lanthanum 1000 MG chewable tablet  Commonly known as:  FOSRENOL  Chew 2,000 mg by mouth 3 (three) times daily with meals.     SENSIPAR 60 MG tablet  Generic drug:  cinacalcet  Take 1 tablet by mouth daily.     sevelamer carbonate 800 MG tablet  Commonly known as:  RENVELA  Take 2,400 mg by mouth 3 (three) times daily with meals. Reported on 10/13/2015     traMADol 50 MG tablet  Commonly known as:  ULTRAM  Take 1 tablet (50 mg total) by mouth every 12 (twelve) hours as needed.        Discharge Instructions: Please refer to Patient Instructions section of EMR for full details.  Patient was counseled important signs and symptoms that should prompt return to medical care, changes in medications, dietary instructions, activity restrictions, and follow up appointments.   Follow-Up Appointments: Follow-up Information    Schedule an appointment as soon as possible for a visit with Robyn Haber, MD.   Specialty:  Family Medicine   Why:  hospital follow up   Contact information:   [place] [id number]       Mercy Riding, MD 10/18/2015, 1:32 AM PGY-1, [place]

## 2015-10-24 NOTE — Progress Notes (Signed)
Done

## 2015-10-31 ENCOUNTER — Encounter: Payer: Self-pay | Admitting: Neurology

## 2015-10-31 ENCOUNTER — Ambulatory Visit (INDEPENDENT_AMBULATORY_CARE_PROVIDER_SITE_OTHER): Payer: Medicare Other | Admitting: Neurology

## 2015-10-31 VITALS — BP 157/82 | HR 86 | Ht 77.0 in | Wt 287.6 lb

## 2015-10-31 DIAGNOSIS — B0229 Other postherpetic nervous system involvement: Secondary | ICD-10-CM

## 2015-10-31 MED ORDER — GABAPENTIN 100 MG PO CAPS: 100.0000 mg | ORAL_CAPSULE | Freq: Every day | ORAL | Status: DC

## 2015-10-31 NOTE — Progress Notes (Signed)
[id number]    Provider:  Dr Jaynee Eagles Referring Provider: Robyn Haber, MD Primary Care Physician:  Robyn Haber, MD  [place]    Provider: Dr Jaynee Eagles Referring Provider: Robyn Haber, MD Primary Care Physician: Robyn Haber, MD  CC: numbness  Interval history 10/31/2015:: He had shingles in his legs. He had Valtrex toxicity. This is a 55 year old gentleman who was on hemodialysis 3 times a week. 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 He has peripheral neuropathy in his feet and chronic back pain. Numbness in the balls of his feet and the toes, more numbness and feeling cold. Not burning or painful. We can watch it clinically, reviewed testing to date. He has lower back and hip pain. He has days that hurt and other days that are better. Alleve helps. Discussed MRI of the lumbar spine below. He is having post-herpatic neuralgia can start soe neurontin at ngiht.   Labs checked include: b12, tsh, shogrens, rpr, hep c, heavy metals, cryoglobulins, ana, hgba1c, B6 (lmildly low), pan-anca, lyme, IFE, hiv.   MRI of the lumbar spine: The lumbar vertebrae demonstrate abnormal alignment with 5 mm anterior subluxation of L5 over S1 but no pars defect. The vertebral body heights appear normal. There are endplate degenerative changes noted throughout most prominent at L4-5 and L5-S1. There is abnormal fatty signal in the vertebral bodies likely indicative of chronic disease likely anemia.  L1-L2 there are no significant abnormalities noted. L2-3 shows minor disc degenerative changes and asymmetric facet hypertrophy left more than right but without significant stenosis. L3-4 also shows similar changes with mild lateral recess narrowing on the left but without significant compression. L4-5 also shows similar changes with prominent central disc bulge but no significant compression. L5-S1 shows loss of disc height and  spoke spondylolisthesis with dominant facet hypertrophy but no significant compression. The conus medullaris terminates at L1. The visualized paraspinal soft tissue appear unremarkable.   IMPRESSION: Abnormal MRI scan lumbar spine showing mild disc and facet degenerative changes but without significant compression.   Interval history 11/21: he is having bad sciatica. The more he walks or stands. He has a sharp pain in the middle of his butt cheeks and radiates into the groin and all the way down the leg and down to the feet. He has weakness in the right leg. Worsening. Gets so bad he can't even walk in the house. He is taking Alleve which seems to help but doesn't get rid of it. He has heel pain in the right leg and is tender to the touch. The back pain started 6 weeks ago and getting worse. He has LBP. Sharp in the buttocks. If he stretches forward it helps a lot. Can be extremely bad, daily. When he has the pain he has to sit which makes it better. No bowel or bladder changes. Interfering with walking and exercising. He still has sensory changes in the feet due to the polyneuropathy.  HPI: Mario Woods is a 55 y.o. male here as a referral from Dr. Joseph Art for numbness in the balls of his feet and the toes. 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 Started 4-6 months ago. He first noticed it when he felt cold in the winter. Symptoms are continuous. He notices it more now, doesn't prevent him from doing anything, his feet are starting to hurt more when exercising, has cramps in his feet. Balance is poor but no falls, he feels less  balanced. He has to focus more on walking than he used to. No low back pain or radicular symptoms. Symptoms are symmetric in the feet and the hands or not affected. His left buttocks and back of the thigh are numb which is chronic from teenage years. No bowel or bladder issues.   Review of Systems: Patient complains of symptoms per HPI as well as the  following symptoms: cough. No CP, no SOB. Pertinent negatives per HPI. All others negative.   Social History   Social History  . Marital Status: Married    Spouse Name: Katy Apo  . Number of Children: 5  . Years of Education: BS   Occupational History  . [employer]    Social History Main Topics  . Smoking status: Former Smoker -- 0.25 packs/day for 20 years    Types: Cigarettes  . Smokeless tobacco: Never Used     Comment: social smoker   . Alcohol Use: 0.0 oz/week    0 Standard drinks or equivalent per week     Comment: minimal-rare   . Drug Use: No  . Sexual Activity: Yes    Birth Control/ Protection: Inserts   Other Topics Concern  . Not on file   Social History Narrative   Lives at home with wife and children.   Caffeine use: occassional     Family History  Problem Relation Age of Onset  . Pancreatic cancer Maternal Grandmother   . Colon cancer Neg Hx   . Colon polyps Neg Hx   . Rectal cancer Neg Hx   . Stomach cancer Neg Hx     Past Medical History  Diagnosis Date  . H/O kidney transplant     takes Cellcept and Prograf daily-off as transplant failed   . Gout due to renal impairment     history (04/28/2012)  . Hyperlipidemia     borderline but no meds  . Pneumonia     hx of;last time 2010  . History of bronchitis     unsure of how long ago  . Tingling     fingers r/t meds he takes  . History of gout     but no meds required  . Diarrhea     med  . Anemia   . History of blood transfusion     no abnormal reaction noted  . Hypertension     but doesn't take any meds  . Shortness of breath     With exertion  . Other back pain     Disc problems - age 35 y/o (60)  . Chronic kidney disease     dialysis M<W<F-[facility]   . Dialysis AV fistula infection ([place])     left forearm  . Failed kidney transplant     started dialysis 02-2013 after failure    Past Surgical History  Procedure Laterality Date  . Kidney transplant  04/2009    . Av fistula placement      Left  . Incisional hernia repair  04/27/2012    Procedure: LAPAROSCOPIC INCISIONAL HERNIA;  Surgeon: [name], MD;  Location: [facility];  Service: General;  Laterality: N/A;  . Insertion of mesh  04/27/2012    Procedure: INSERTION OF MESH;  Surgeon: [name], MD;  Location: [facility];  Service: General;  Laterality: N/A;  . Hernia repair  01/2009    primary ventral hernia repair x2   . Appendectomy  01/2009  . Capd insertion N/A 08/03/2013    Procedure: LAPAROSCOPIC  EXPLORATION OF THE ABDOMEN WITH PLACEMENT OF PERITONEAL DIALYSIS CATHETER;  Surgeon: Adin Hector, MD;  Location: [facility];  Service: General;  Laterality: N/A;  . Capd insertion N/A 09/06/2013    Procedure: DIAGNOSTIC LAPAROSCOPY WITH  REPOSITIONING OF CONTINUOUS AMBULATORY PERITONEAL DIALYSIS  (CAPD) CATHETER ;  Surgeon: Adin Hector, MD;  Location: [facility];  Service: General;  Laterality: N/A;  . Capd removal N/A 02/08/2014    Procedure: REMOVAL OF PERITONEAL DIALYSIS CATHETER;  Surgeon: Michael Boston, MD;  Location: [facility];  Service: General;  Laterality: N/A;    Current Outpatient Prescriptions  Medication Sig Dispense Refill  . calcium carbonate (OS-CAL) 600 MG TABS tablet Take 600 mg by mouth 3 (three) times daily with meals as needed. Reported on 10/13/2015    . lanthanum (FOSRENOL) 1000 MG chewable tablet Chew 2,000 mg by mouth 3 (three) times daily with meals.    . SENSIPAR 60 MG tablet Take 1 tablet by mouth daily.  6  . sevelamer carbonate (RENVELA) 800 MG tablet Take 2,400 mg by mouth 3 (three) times daily with meals. Reported on 10/13/2015    . traMADol (ULTRAM) 50 MG tablet Take 1 tablet (50 mg total) by mouth every 12 (twelve) hours as needed. 60 tablet 3   No current facility-administered medications for this visit.    Allergies as of 10/31/2015 - Review Complete 10/31/2015  Allergen Reaction Noted  . Tape Rash 04/20/2012    Vitals: BP 157/82 mmHg  Pulse 86  Ht 6\' 5"  (1.956  m)  Wt 287 lb 9.6 oz (130.455 kg)  BMI 34.10 kg/m2 Last Weight:  Wt Readings from Last 1 Encounters:  10/31/15 287 lb 9.6 oz (130.455 kg)   Last Height:   Ht Readings from Last 1 Encounters:  10/31/15 6\' 5"  (1.956 m)     Cranial Nerves:  The pupils are equal, round, and reactive to light. Visual fields are full to finger confrontation. Extraocular movements are intact. Trigeminal sensation is intact and the muscles of mastication are normal. The face is symmetric. The palate elevates in the midline. Hearing intact. Voice is normal. Shoulder shrug is normal. The tongue has normal motion without fasciculations.   Tone:  Normal muscle tone.   Posture:  Posture is normal. normal erect   Strength:  Strength is V/V in the upper and lower limbs.    Sensation: Decreased pinprick and temperature distally in the lower extremities to the ankles, absent vibration at the great toes, intact proprioception   Reflex Exam:  DTR's: absent AJs. Deep tendon reflexes in the upper and lower extremities are hyporeflexic bilaterally.    Assessment/Plan: 55 year old patient with a past medical history of chronic kidney disease who has symmetric numbness in his feet, cramping and some imbalance. This is likely peripheral polyneuropathy. Exam shows large and small fiber nerve distal decrease in the feet. Strength is intact. Hands are unaffected. It is very common to have peripheral polyneuropathy due to chronic kidney disease. The best way to prevent progression is to be compliant with medications and dialysis. A thorough serum evaluation for peripheral neuropathy for other etiologies was negative. Will follow clinically.  Also with worsening lumbosacral radiculitis. MRI of the lumbar spine showed degenerative changes without significant stenosis or nerve impingement.   Creatinine clearance is 16. Ok for tramadol low dose 50mg  twice daily Can try 100mg  neurontin once daily at night.     Sarina Ill, MD  [facility] [address] [place], [place] [address]  Phone [phone] Fax [phone]  A total of 30 minutes was spent face-to-face with this patient. Over half this time was spent on counseling patient on the neuropathy and lumbosacral radiculopathy diagnosis and different diagnostic and therapeutic options available.

## 2015-10-31 NOTE — Patient Instructions (Addendum)
Remember to drink plenty of fluid, eat healthy meals and do not skip any meals. Try to eat protein with a every meal and eat a healthy snack such as fruit or nuts in between meals. Try to keep a regular sleep-wake schedule and try to exercise daily, particularly in the form of walking, 20-30 minutes a day, if you can.   As far as your medications are concerned, I would like to suggest: Neurontin 100mg  at night, Tramadol 50mg  twice daily as needed  I would like to see you back as needed, sooner if we need to. Please call us with any interim questions, concerns, problems, updates or refill requests.   Our phone number is [phone]. We also have an after hours call service for urgent matters and there is a physician on-call for urgent questions. For any emergencies you know to call 911 or go to the nearest emergency room

## 2016-06-18 ENCOUNTER — Ambulatory Visit (INDEPENDENT_AMBULATORY_CARE_PROVIDER_SITE_OTHER): Payer: Medicare Other | Admitting: Neurology

## 2016-06-18 ENCOUNTER — Encounter: Payer: Self-pay | Admitting: Neurology

## 2016-06-18 VITALS — BP 145/85 | HR 72 | Wt 268.4 lb

## 2016-06-18 DIAGNOSIS — G629 Polyneuropathy, unspecified: Secondary | ICD-10-CM

## 2016-06-18 NOTE — Patient Instructions (Addendum)
Remember to drink plenty of fluid, eat healthy meals and do not skip any meals. Try to eat protein with a every meal and eat a healthy snack such as fruit or nuts in between meals. Try to keep a regular sleep-wake schedule and try to exercise daily, particularly in the form of walking, 20-30 minutes a day, if you can.   As far as your medications are concerned, I would like to suggest: low-dose Lyrica, Cymbalta, Nortriptyline - please discuss with nephrologist.   I would like to see you back as needed, sooner if we need to. Please call us with any interim questions, concerns, problems, updates or refill requests.   Our phone number is [phone]. We also have an after hours call service for urgent matters and there is a physician on-call for urgent questions. For any emergencies you know to call 911 or go to the nearest emergency room

## 2016-06-18 NOTE — Progress Notes (Signed)
[facility]    Provider:  Dr Jaynee Eagles Referring Provider: Robyn Haber, MD Primary Care Physician:  Robyn Haber, MD  CC: numbness  Interval history 06/18/2016: He had a kidney transplant. He still has pain in the feet from his neuropathy, numbness int he toes, doesn't really affect him at night just walking on it hurts, no tingling, no burning, just some mild discomfort walking on it. Creatinine is 1.94 and BUN 36 Crcl as of 06/12/2016 is 76. We discussed several drugs that patient could use, amitriptyline or nortriptyline, Cymbalta, Lyrica are all good drugs for neuropathy in the feet. Also discussed obesity and loss of weight could help with pain in the feet. Discussed medications as far as renal clearance. I would like him to discuss medications with his nephrologist before starting him on anything considering he just had renal transplant.  Interval history 10/31/2015:: He had shingles in his legs. He had Valtrex toxicity. This is a 56 year old gentleman who was on hemodialysis 3 times a week. 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 He has peripheral neuropathy in his feet and chronic back pain. Numbness in the balls of his feet and the toes, more numbness and feeling cold. Not burning or painful. We can watch it clinically, reviewed testing to date. He has lower back and hip pain. He has days that hurt and other days that are better. Alleve helps. Discussed MRI of the lumbar spine below. He is having post-herpatic neuralgia can start soe neurontin at ngiht.   Labs checked include: b12, tsh, shogrens, rpr, hep c, heavy metals, cryoglobulins, ana, hgba1c, B6 (lmildly low), pan-anca, lyme, IFE, hiv.   MRI of the lumbar spine: The lumbar vertebrae demonstrate abnormal alignment with 5 mm anterior subluxation of L5 over S1 but no pars defect. The vertebral body heights appear normal. There are endplate degenerative changes noted throughout  most prominent at L4-5 and L5-S1. There is abnormal fatty signal in the vertebral bodies likely indicative of chronic disease likely anemia.  L1-L2 there are no significant abnormalities noted. L2-3 shows minor disc degenerative changes and asymmetric facet hypertrophy left more than right but without significant stenosis. L3-4 also shows similar changes with mild lateral recess narrowing on the left but without significant compression. L4-5 also shows similar changes with prominent central disc bulge but no significant compression. L5-S1 shows loss of disc height and spoke spondylolisthesis with dominant facet hypertrophy but no significant compression. The conus medullaris terminates at L1. The visualized paraspinal soft tissue appear unremarkable.   IMPRESSION: Abnormal MRI scan lumbar spine showing mild disc and facet degenerative changes but without significant compression.   Interval history 11/21: he is having bad sciatica. The more he walks or stands. He has a sharp pain in the middle of his butt cheeks and radiates into the groin and all the way down the leg and down to the feet. He has weakness in the right leg. Worsening. Gets so bad he can't even walk in the house. He is taking Alleve which seems to help but doesn't get rid of it. He has heel pain in the right leg and is tender to the touch. The back pain started 6 weeks ago and getting worse. He has LBP. Sharp in the buttocks. If he stretches forward it helps a lot. Can be extremely bad, daily. When he has the pain he has to sit which makes it better. No bowel or bladder changes. Interfering with walking and exercising. He  still has sensory changes in the feet due to the polyneuropathy.  HPI: [facility] is a 56 y.o. male here as a referral from Dr. Joseph Art for numbness in the balls of his feet and the toes. 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 Started 4-6 months ago. He first noticed it when he felt cold  in the winter. Symptoms are continuous. He notices it more now, doesn't prevent him from doing anything, his feet are starting to hurt more when exercising, has cramps in his feet. Balance is poor but no falls, he feels less balanced. He has to focus more on walking than he used to. No low back pain or radicular symptoms. Symptoms are symmetric in the feet and the hands or not affected. His left buttocks and back of the thigh are numb which is chronic from teenage years. No bowel or bladder issues.   Review of Systems: Patient complains of symptoms per HPI as well as the following symptoms: cough. No CP, no SOB. Pertinent negatives per HPI. All others negative.   Social History   Social History  . Marital status: Married    Spouse name: [facility]  . Number of children: 5  . Years of education: BS   Occupational History  . [employer]    Social History Main Topics  . Smoking status: Former Smoker    Packs/day: 0.25    Years: 20.00    Types: Cigarettes  . Smokeless tobacco: Never Used     Comment: social smoker   . Alcohol use 0.0 oz/week     Comment: minimal-rare   . Drug use: No  . Sexual activity: Yes    Birth control/ protection: Inserts   Other Topics Concern  . Not on file   Social History Narrative   Lives at home with wife and children.   Caffeine use: occassional     Family History  Problem Relation Age of Onset  . Pancreatic cancer Maternal Grandmother   . Colon cancer Neg Hx   . Colon polyps Neg Hx   . Rectal cancer Neg Hx   . Stomach cancer Neg Hx     Past Medical History:  Diagnosis Date  . Anemia   . Chronic kidney disease    dialysis [facility]W<F-[facility]   . Dialysis AV fistula infection ([place])    left forearm  . Diarrhea    med  . Failed kidney transplant    started dialysis 02-2013 after failure  . Gout due to renal impairment    history (04/28/2012)  . H/O kidney transplant    takes Cellcept and Prograf daily-off as transplant  failed   . History of blood transfusion    no abnormal reaction noted  . History of bronchitis    unsure of how long ago  . History of gout    but no meds required  . Hyperlipidemia    borderline but no meds  . Hypertension    but doesn't take any meds  . Other back pain    Disc problems - age 72 y/o (29)  . Pneumonia    hx of;last time 2010  . Shortness of breath    With exertion  . Tingling    fingers r/t meds he takes    Past Surgical History:  Procedure Laterality Date  . APPENDECTOMY  01/2009  . AV FISTULA PLACEMENT     Left  . CAPD INSERTION N/A 08/03/2013   Procedure: LAPAROSCOPIC EXPLORATION  OF THE ABDOMEN WITH PLACEMENT OF PERITONEAL DIALYSIS CATHETER;  Surgeon: Adin Hector, MD;  Location: [place];  Service: General;  Laterality: N/A;  . CAPD INSERTION N/A 09/06/2013   Procedure: DIAGNOSTIC LAPAROSCOPY WITH  REPOSITIONING OF CONTINUOUS AMBULATORY PERITONEAL DIALYSIS  (CAPD) CATHETER ;  Surgeon: Adin Hector, MD;  Location: [place];  Service: General;  Laterality: N/A;  . CAPD REMOVAL N/A 02/08/2014   Procedure: REMOVAL OF PERITONEAL DIALYSIS CATHETER;  Surgeon: Michael Boston, MD;  Location: [place];  Service: General;  Laterality: N/A;  . HERNIA REPAIR  01/2009   primary ventral hernia repair x2   . INCISIONAL HERNIA REPAIR  04/27/2012   Procedure: LAPAROSCOPIC INCISIONAL HERNIA;  Surgeon: [name], MD;  Location: [place];  Service: General;  Laterality: N/A;  . INSERTION OF MESH  04/27/2012   Procedure: INSERTION OF MESH;  Surgeon: [name], MD;  Location: [place];  Service: General;  Laterality: N/A;  . KIDNEY TRANSPLANT  04/2009    Current Outpatient Prescriptions  Medication Sig Dispense Refill  . amLODipine (NORVASC) 5 MG tablet Take 5 mg by mouth daily.    Marland Kitchen aspirin EC 81 MG tablet Take 81 mg by mouth daily.    . mycophenolate (MYFORTIC) 180 MG EC tablet Take 4 tablets by mouth 2 (two) times daily.    Marland Kitchen omeprazole (PRILOSEC) 20 MG capsule Take 20  mg by mouth daily.    . predniSONE (DELTASONE) 5 MG tablet Take 10 mg by mouth daily.    . sertraline (ZOLOFT) 50 MG tablet Take 50 mg by mouth daily.    . sodium bicarbonate 650 MG tablet Take 650 mg by mouth 2 (two) times daily.    Marland Kitchen sulfamethoxazole-trimethoprim (BACTRIM,SEPTRA) 400-80 MG tablet Take by mouth. 1 tablet Monday, Wednesday, Friday    . tacrolimus (PROGRAF) 1 MG capsule Take 1 mg by mouth. 4 tablets in the morning 3 tablets at night     No current facility-administered medications for this visit.     Allergies as of 06/18/2016 - Review Complete 06/18/2016  Allergen Reaction Noted  . Tape Rash 04/20/2012    Vitals: BP (!) 145/85 (BP Location: Right Arm, Patient Position: Sitting, Cuff Size: Large)   Pulse 72   Wt 268 lb 6.4 oz (121.7 kg)   SpO2 98%   BMI 31.83 kg/m  Last Weight:  Wt Readings from Last 1 Encounters:  06/18/16 268 lb 6.4 oz (121.7 kg)   Last Height:   Ht Readings from Last 1 Encounters:  10/31/15 6\' 5"  (1.956 m)    Cranial Nerves:  The pupils are equal, round, and reactive to light. Visual fields are full to finger confrontation. Extraocular movements are intact. Trigeminal sensation is intact and the muscles of mastication are normal. The face is symmetric. The palate elevates in the midline. Hearing intact. Voice is normal. Shoulder shrug is normal. The tongue has normal motion without fasciculations.   Tone:  Normal muscle tone.   Posture:  Posture is normal. normal erect   Strength:  Strength is V/V in the upper and lower limbs.    Sensation: Decreased pinprick and temperature distally in the lower extremities to the ankles, absent vibration at the great toes, intact proprioception   Reflex Exam:  DTR's: absent AJs. Deep tendon reflexes in the upper and lower extremities are hyporeflexic bilaterally.    Assessment/Plan: 56 year old patient with a past medical history of chronic kidney disease s/p transplant  who has symmetric numbness in his feet, cramping  and some imbalance c/w peripheral polyneuropathy. Exam shows large and small fiber nerve distal decrease in the feet. Strength is intact. Hands are unaffected. It is very common to have peripheral polyneuropathy due to chronic kidney disease. The best way to prevent progression is to be compliant with medications and dialysis, symptoms may improve s/p transplant. A thorough serum evaluation for peripheral neuropathy for other etiologies was negative. Discussed medications such as Cymbalta, Lyrica and tricyclic antidepressants like nortriptyline or amitriptyline. Patient will discuss with nephrologist if needed. Also with worsening lumbosacral radiculitis. MRI of the lumbar spine showed degenerative changes without significant stenosis or nerve impingement.    Sarina Ill, MD  [facility] [address] [place], [place] [address]  Phone [phone] Fax [phone]  A total of 25 minutes was spent face-to-face with this patient. Over half this time was spent on counseling patient on the axonal polyneuropathy diagnosis and different diagnostic and therapeutic options available.

## 2017-01-01 IMAGING — CR DG CHEST 2V
2 series · 2 of 2 positions shown · non-contrast
Comparison: Chest x-ray from an acute abdominal series of September 06, 2013

CLINICAL DATA: Semi productive cough for 6 months, no fever,
dialysis patient.

EXAM:
CHEST  2 VIEW

[w chest pa]
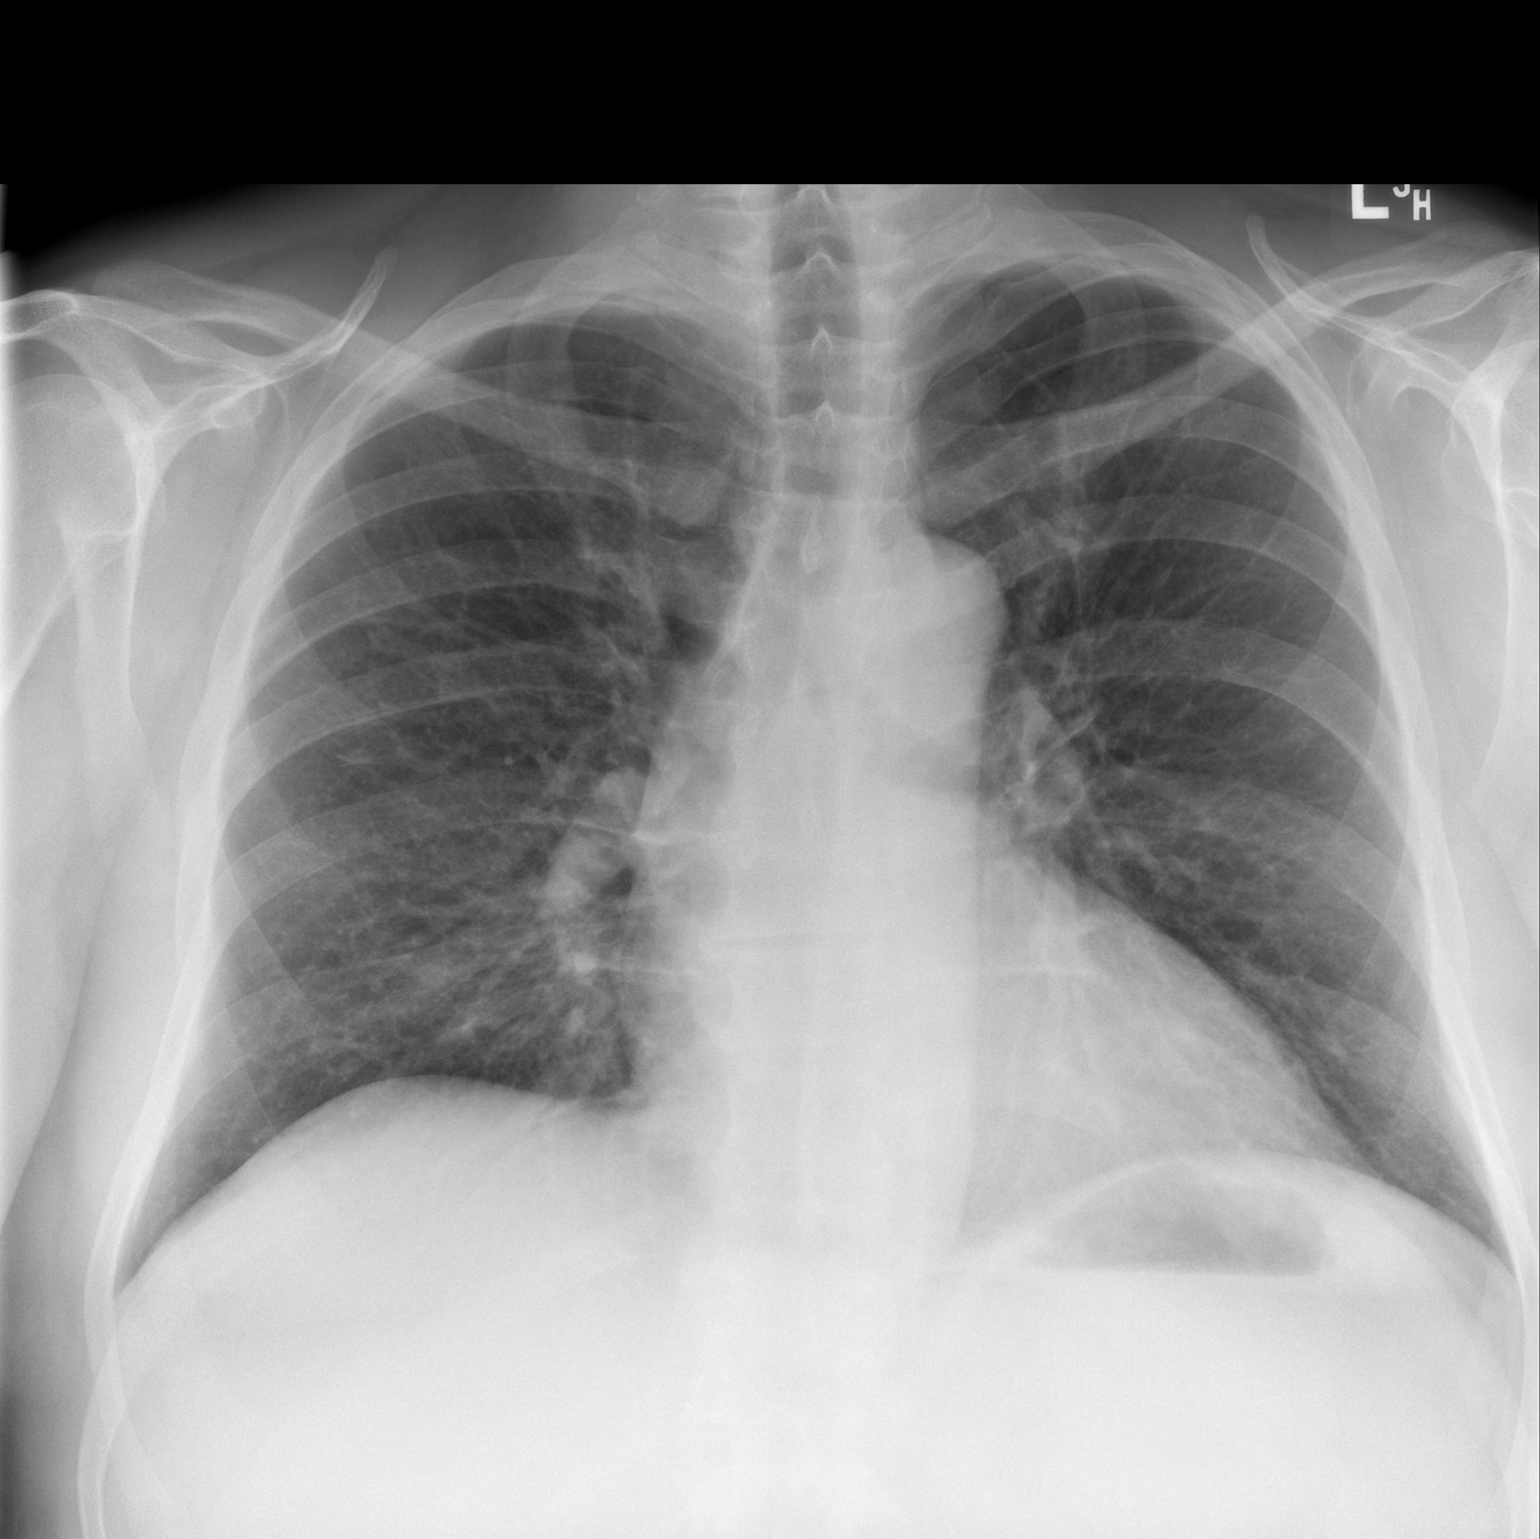

[w chest lat]
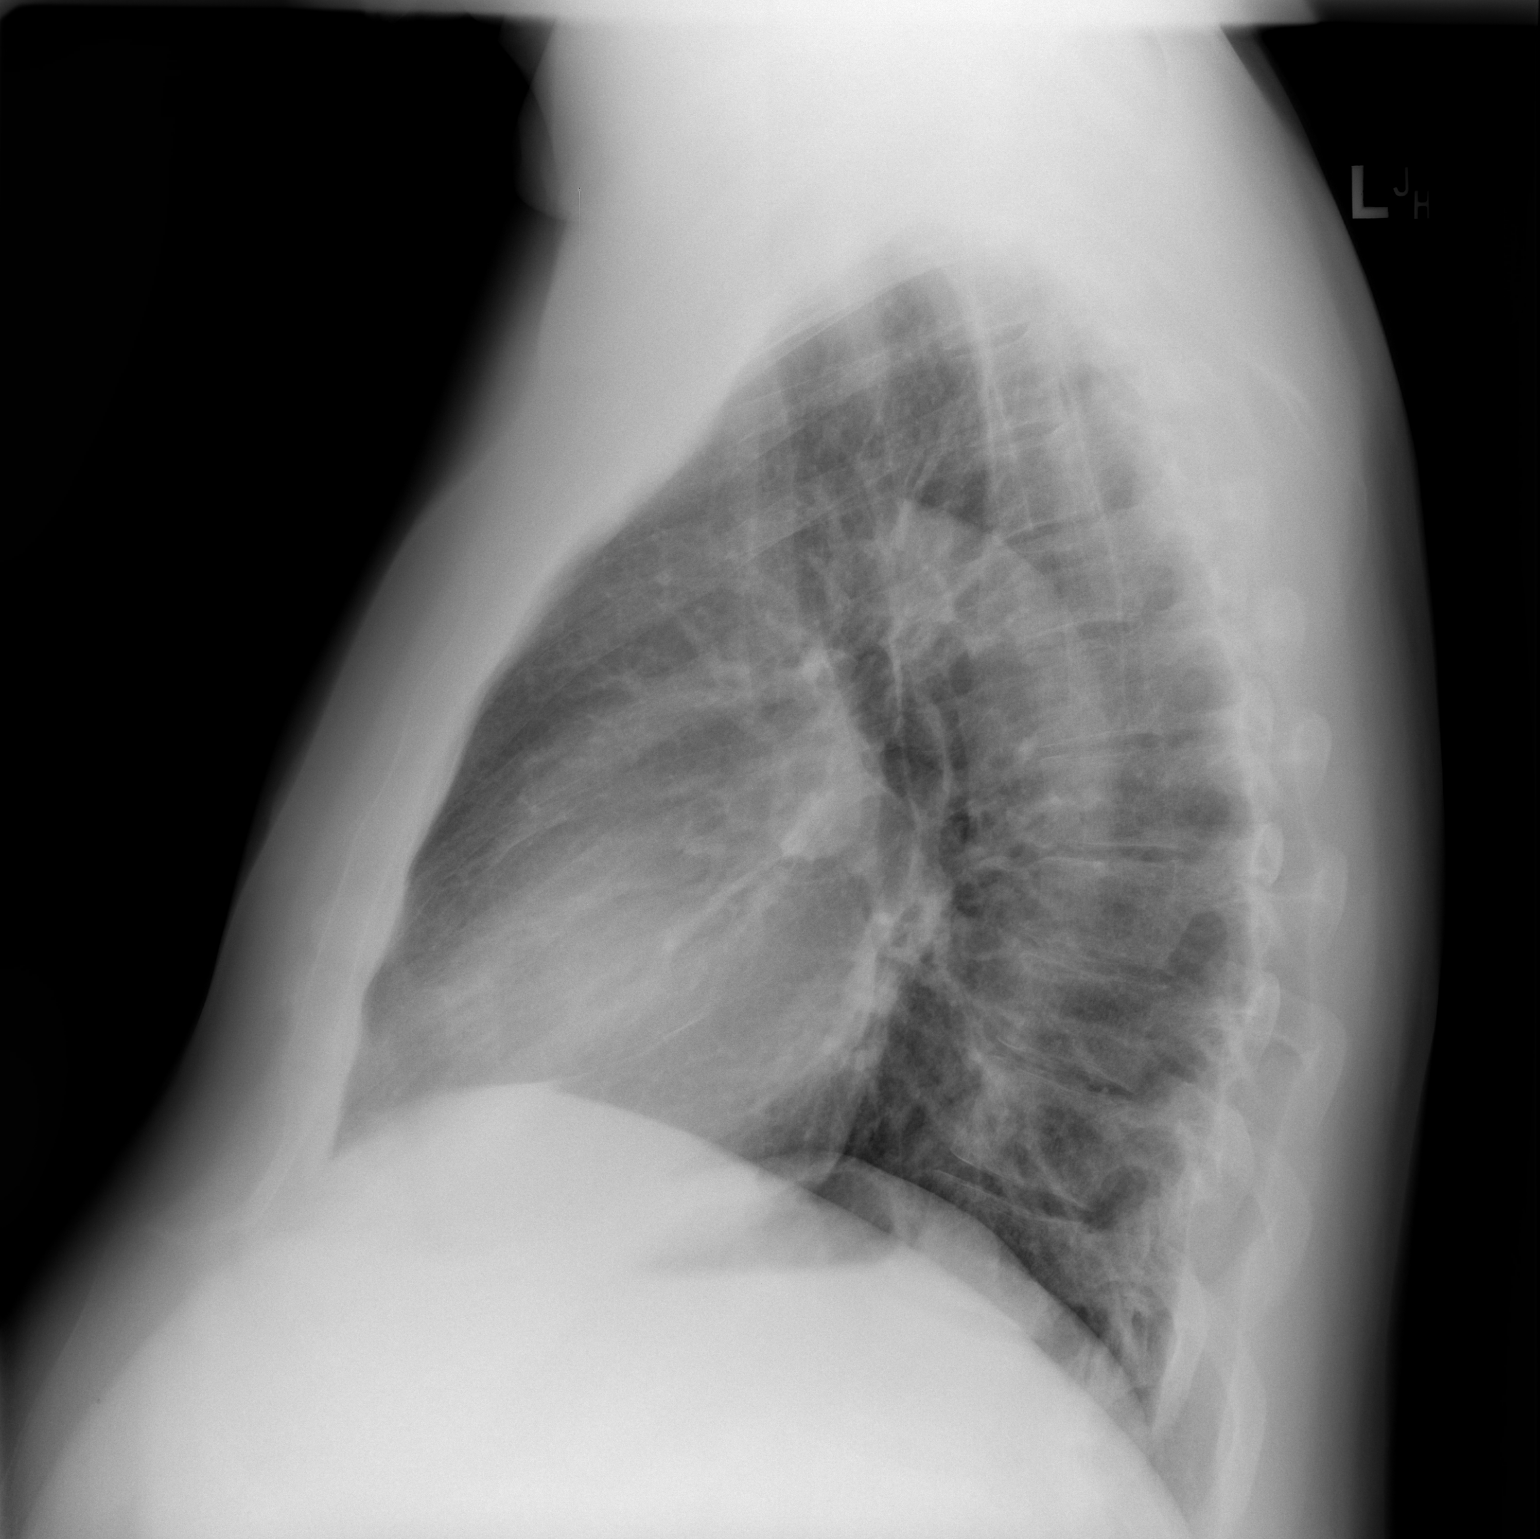

[2 of 2 positions shown; findings below may reference images not displayed]

FINDINGS: The lungs are adequately inflated. The interstitial markings are
coarse but stable. The cardiac silhouette is top-normal in size. The
pulmonary vascularity is not engorged. The mediastinum is normal in
width. There is no pleural effusion. The bony thorax is
unremarkable.
IMPRESSION: Stable coarse interstitial lung markings which may reflect chronic
mild interstitial edema or fibrosis. There is no acute
cardiopulmonary abnormality.

## 2017-10-02 ENCOUNTER — Encounter: Payer: Self-pay | Admitting: *Deleted

## 2017-10-21 ENCOUNTER — Encounter: Payer: Self-pay | Admitting: Internal Medicine

## 2017-10-21 NOTE — Progress Notes (Signed)
After review of records, Dr Hilarie Fredrickson feels it is time for patient's recall colonoscopy (history of colon polyps -- first adenomatous polyp in 2016; previous fair prep--suprep). According to our records, patient's last procedure was 12/08/14.   Patient was seen at [facility] in [place] 01/2014 (after a failed kidney transplant). It appears he may have been trying to get back on the transplant list at that time and was having testing done prior to that so he began having his GI workup at that facility.  In 2016, patient was scheduled to see Dr Deatra Ina for colonoscopy but cancelled. He later was placed on Dr Vena Rua schedule where he actually did have colonoscopy procedure.   Since that time, patient has had additional interaction with [facility] but again appears to have been in the midst of trying for another kidney transplant. Therefore we are unsure if this patient is having GI workup at [facility] as part of his transplant evaluation or if he has intended to change his full GI care to [facility] rather than our care.   I have left a voicemail on 10/09/17 at 3:58 pm for patient to call back and I have left an additional voicemail today, 10/21/17 at 9:06 am for patient to call back so I can hopefully sort out whether he wishes to continue being seen here or at [facility]. At any rate, we will send the patient a recall colonoscopy letter so he is aware that he needs procedure.

## 2018-10-13 ENCOUNTER — Other Ambulatory Visit: Payer: Self-pay | Admitting: Nephrology

## 2018-10-13 DIAGNOSIS — R748 Abnormal levels of other serum enzymes: Secondary | ICD-10-CM

## 2018-10-13 DIAGNOSIS — Z940 Kidney transplant status: Secondary | ICD-10-CM

## 2018-10-20 ENCOUNTER — Ambulatory Visit
Admission: RE | Admit: 2018-10-20 | Discharge: 2018-10-20 | Disposition: A | Discharge status: Home / Self Care | Payer: Managed Care, Other (non HMO) | Source: Ambulatory Visit | Attending: Nephrology | Admitting: Nephrology

## 2018-10-20 DIAGNOSIS — Z940 Kidney transplant status: Secondary | ICD-10-CM

## 2018-10-20 DIAGNOSIS — R748 Abnormal levels of other serum enzymes: Secondary | ICD-10-CM

## 2018-10-23 ENCOUNTER — Other Ambulatory Visit: Payer: Self-pay | Admitting: Nephrology

## 2018-10-23 ENCOUNTER — Other Ambulatory Visit (HOSPITAL_COMMUNITY): Payer: Self-pay | Admitting: Nephrology

## 2018-10-23 DIAGNOSIS — R748 Abnormal levels of other serum enzymes: Secondary | ICD-10-CM

## 2018-10-28 ENCOUNTER — Other Ambulatory Visit: Payer: Self-pay

## 2018-10-28 ENCOUNTER — Ambulatory Visit (HOSPITAL_COMMUNITY)
Admission: RE | Admit: 2018-10-28 | Discharge: 2018-10-28 | Disposition: A | Discharge status: Home / Self Care | Payer: Managed Care, Other (non HMO) | Source: Ambulatory Visit | Attending: Nephrology | Admitting: Nephrology

## 2018-10-28 DIAGNOSIS — R748 Abnormal levels of other serum enzymes: Secondary | ICD-10-CM | POA: Diagnosis present

## 2018-10-29 ENCOUNTER — Encounter (HOSPITAL_COMMUNITY): Payer: Managed Care, Other (non HMO)

## 2020-10-30 ENCOUNTER — Encounter: Payer: Self-pay | Admitting: *Deleted

## 2020-11-09 ENCOUNTER — Ambulatory Visit: Payer: Managed Care, Other (non HMO) | Admitting: Internal Medicine

## 2020-11-09 ENCOUNTER — Encounter: Payer: Self-pay | Admitting: Internal Medicine

## 2020-11-09 ENCOUNTER — Other Ambulatory Visit (INDEPENDENT_AMBULATORY_CARE_PROVIDER_SITE_OTHER): Payer: Managed Care, Other (non HMO)

## 2020-11-09 VITALS — BP 124/82 | HR 82 | Ht 77.0 in | Wt 248.0 lb

## 2020-11-09 DIAGNOSIS — K529 Noninfective gastroenteritis and colitis, unspecified: Secondary | ICD-10-CM | POA: Diagnosis not present

## 2020-11-09 DIAGNOSIS — D849 Immunodeficiency, unspecified: Secondary | ICD-10-CM

## 2020-11-09 DIAGNOSIS — Z86010 Personal history of colonic polyps: Secondary | ICD-10-CM

## 2020-11-09 LAB — TSH: TSH: 1.28 u[IU]/mL (ref 0.35–4.50)

## 2020-11-09 MED ORDER — DIPHENOXYLATE-ATROPINE 2.5-0.025 MG PO TABS: 1.0000 | ORAL_TABLET | Freq: Three times a day (TID) | ORAL | 1 refills | Status: DC | PRN

## 2020-11-09 MED ORDER — PLENVU 140 G PO SOLR: 1.0000 | ORAL | 0 refills | Status: DC

## 2020-11-09 NOTE — Progress Notes (Signed)
Patient ID: Slayden Mennenga., male   DOB: [birth year], 60 y.o.   MRN: [id number] HPI: Alexsis Branscom is a 60 year old male with a past medical history of adenomatous colon polyps, diverticulosis, ESRD status post kidney transplant x2 last in June 2017 on immunosuppressive therapy who is seen in consult at the request of Dr. Buelah Manis to evaluate chronic diarrhea.  He is here alone today.  He is known to me from colonoscopy performed for screening on 12/08/2014. Colonoscopy revealed 4 polyps ranging in size from 5 to 12 mm which were removed.  2 of these polyps were adenomatous, 2 were hyperplastic.  There was moderate diverticulosis in the transverse, descending and sigmoid colon.  The colonoscopy was incomplete due to tortuosity and redundancy.  The cecal base was not fully seen. Virtual colonoscopy was then performed which showed no acute findings and midline ventral hernia containing fat.  He reports that he has been struggling with diarrhea chronically dating back to April 2019.  This started suddenly while he was traveling to [facility].  He was later diagnosed with norovirus.  Symptoms started with nausea vomiting and diarrhea.  The nausea vomiting improved but the diarrhea persisted.  He was then tested again in April 2021 and norovirus was again positive.  He reports his immunosuppressive regimen was decreased and the diarrhea has persisted.  He was even treated with Alinia without improvement.  He describes bowel movements vary but occur at least 2-3 times every morning and are always loose and watery.  He will have occasional bowel movements later in the day and even at night.  He has had accidents at night while sleeping and at other times he does wake up and is able to have a loose stool.  He does have gas and bloating as well as borborygmi.  No blood in stool or melena.  No abdominal pain.  Diarrhea is worse with tomato-based foods such as spaghetti.  Overall he is lost 30 to 40 pounds and reports he  really has no appetite and food is not appetizing.  He does not have nausea, vomiting, early satiety.  He does have fatigue and reports it is very easy for him to nap.  His Prograf was decreased from 4 mg twice daily to 1.5 mg twice daily.  His mycophenolate was also reduced from 4 tablets twice daily to 3 tablets twice daily.  He takes prednisone 10 mg and Bactrim 3 times a week.  He has not used antidiarrheals.  He works as a Surveyor, minerals for [employer].    Past Medical History:  Diagnosis Date  . Anemia   . Chronic kidney disease    dialysis [facility]W<F-[facility]   . Dialysis AV fistula infection ([place])    left forearm  . Diarrhea    med  . Diverticulosis   . Failed kidney transplant    started dialysis 02-2013 after failure  . Gout due to renal impairment    history (04/28/2012)  . H/O kidney transplant    takes Cellcept and Prograf daily-off as transplant failed   . History of blood transfusion    no abnormal reaction noted  . History of bronchitis    unsure of how long ago  . History of gout    but no meds required  . Hyperlipidemia    borderline but no meds  . Hypertension    but doesn't take any meds  . Other back pain    Disc problems - age 30 y/o (  1979)  . Pneumonia    hx of;last time 2010  . Shortness of breath    With exertion  . Skin cancer   . Tingling    fingers r/t meds he takes  . Tubular adenoma of colon     Past Surgical History:  Procedure Laterality Date  . APPENDECTOMY  01/2009  . AV FISTULA PLACEMENT     Left  . CAPD INSERTION N/A 08/03/2013   Procedure: LAPAROSCOPIC EXPLORATION OF THE ABDOMEN WITH PLACEMENT OF PERITONEAL DIALYSIS CATHETER;  Surgeon: Adin Hector, MD;  Location: [place] [facility];  Service: General;  Laterality: N/A;  . CAPD INSERTION N/A 09/06/2013   Procedure: DIAGNOSTIC LAPAROSCOPY WITH  REPOSITIONING OF CONTINUOUS AMBULATORY PERITONEAL DIALYSIS  (CAPD) CATHETER ;  Surgeon: Adin Hector, MD;  Location: [facility];  Service:  General;  Laterality: N/A;  . CAPD REMOVAL N/A 02/08/2014   Procedure: REMOVAL OF PERITONEAL DIALYSIS CATHETER;  Surgeon: Michael Boston, MD;  Location: [facility];  Service: General;  Laterality: N/A;  . HERNIA REPAIR  01/2009   primary ventral hernia repair x2   . INCISIONAL HERNIA REPAIR  04/27/2012   Procedure: LAPAROSCOPIC INCISIONAL HERNIA;  Surgeon: [name], MD;  Location: [place];  Service: General;  Laterality: N/A;  . INSERTION OF MESH  04/27/2012   Procedure: INSERTION OF MESH;  Surgeon: [name], MD;  Location: [facility];  Service: General;  Laterality: N/A;  . KIDNEY TRANSPLANT  04/2009  . KIDNEY TRANSPLANT  2017    Outpatient Medications Prior to Visit  Medication Sig Dispense Refill  . amLODipine (NORVASC) 5 MG tablet Take 5 mg by mouth daily.    Marland Kitchen aspirin EC 81 MG tablet Take 81 mg by mouth daily.    . mycophenolate (MYFORTIC) 180 MG EC tablet Take 3 tablets by mouth 2 (two) times daily.    . predniSONE (DELTASONE) 5 MG tablet Take 10 mg by mouth daily.    . sodium bicarbonate 650 MG tablet Take 650 mg by mouth 2 (two) times daily.    Marland Kitchen sulfamethoxazole-trimethoprim (BACTRIM,SEPTRA) 400-80 MG tablet Take by mouth. 1 tablet Monday, Wednesday, Friday    . tacrolimus (PROGRAF) 1 MG capsule Take 1.5 mg by mouth 2 (two) times daily.    Marland Kitchen VITAMIN A PO Take 1 capsule by mouth daily.    Marland Kitchen omeprazole (PRILOSEC) 20 MG capsule Take 20 mg by mouth daily.    . sertraline (ZOLOFT) 50 MG tablet Take 50 mg by mouth daily.     No facility-administered medications prior to visit.    Allergies  Allergen Reactions  . Tape Rash    The patient stated that he really doesn't think this is a problem now. Paper tape ok    Family History  Problem Relation Age of Onset  . Sjogren's syndrome Mother   . Pancreatic cancer Maternal Grandmother   . Colon cancer Neg Hx   . Colon polyps Neg Hx   . Rectal cancer Neg Hx   . Stomach cancer Neg Hx   . Esophageal cancer Neg Hx   . Liver  disease Neg Hx   . Inflammatory bowel disease Neg Hx     Social History   Tobacco Use  . Smoking status: Former Smoker    Packs/day: 0.25    Years: 20.00    Pack years: 5.00    Types: Cigarettes  . Smokeless tobacco: Never Used  . Tobacco comment: social smoker   Vaping Use  . Vaping Use: Never used  Substance Use Topics  . Alcohol use: Yes    Alcohol/week: 0.0 standard drinks    Comment: minimal-rare   . Drug use: No    ROS: As per history of present illness, otherwise negative  BP 124/82   Pulse 82   Ht 6\' 5"  (1.956 m)   Wt 248 lb (112.5 kg)   SpO2 99%   BMI 29.41 kg/m  Constitutional: Well-developed and well-nourished. No distress. HEENT: Normocephalic and atraumatic. Oropharynx is clear and moist. Conjunctivae are normal.  No scleral icterus. Neck: Neck supple. Trachea midline. Cardiovascular: Normal rate, regular rhythm and intact distal pulses. No M/R/G Pulmonary/chest: Effort normal and breath sounds normal. No wheezing, rales or rhonchi. Abdominal: Soft, nontender, nondistended. Bowel sounds active throughout. There are no masses palpable. No hepatosplenomegaly. Extremities: no clubbing, cyanosis, or edema Neurological: Alert and oriented to person place and time. Skin: Skin is warm and dry.  Psychiatric: Normal mood and affect. Behavior is normal.  RELEVANT LABS AND IMAGING: CBC    Component Value Date/Time   WBC 6.3 10/17/2015 0517   RBC 3.06 (L) 10/17/2015 0517   HGB 10.6 (L) 10/17/2015 0517   HCT 32.1 (L) 10/17/2015 0517   PLT 218 10/17/2015 0517   MCV 104.9 (H) 10/17/2015 0517   MCV 96.5 02/15/2013 1040   MCH 34.6 (H) 10/17/2015 0517   MCHC 33.0 10/17/2015 0517   RDW 14.7 10/17/2015 0517   LYMPHSABS 0.6 (L) 10/15/2015 1151   MONOABS 0.6 10/15/2015 1151   EOSABS 0.4 10/15/2015 1151   BASOSABS 0.0 10/15/2015 1151    CMP     Component Value Date/Time   NA 137 10/17/2015 0517   K 5.3 (H) 10/17/2015 0517   CL 95 (L) 10/17/2015 0517   CO2  23 10/17/2015 0517   GLUCOSE 98 10/17/2015 0517   BUN 51 (H) 10/17/2015 0517   CREATININE 9.91 (H) 10/17/2015 0517   CREATININE 11.86 (HH) 02/15/2013 1124   CALCIUM 9.7 10/17/2015 0517   CALCIUM 8.9 08/10/2008 0955   PROT 6.7 10/15/2015 1151   PROT 7.1 11/15/2014 1554   ALBUMIN 3.6 10/17/2015 0517   AST 16 10/15/2015 1151   ALT 22 10/15/2015 1151   ALKPHOS 108 10/15/2015 1151   BILITOT 0.8 10/15/2015 1151   GFRNONAA 5 (L) 10/17/2015 0517   GFRAA 6 (L) 10/17/2015 0517    ASSESSMENT/PLAN: 60 year old male with a past medical history of adenomatous colon polyps, diverticulosis, ESRD status post kidney transplant x2 last in June 2017 on immunosuppressive therapy who is seen in consult at the request of Dr. Buelah Manis to evaluate chronic diarrhea.   1.  Chronic diarrhea --symptoms started with infectious diarrhea and he did have a pathogen panel revealing norovirus.  Interestingly this showed norovirus again in 2021.  Norovirus should not be a chronic infection even in the setting of immunosuppressed patient.  We discussed his overall symptoms today and I have recommended the following: -- Repeat GI pathogen panel, fecal leukocytes as well as fecal elastase -- Check TSH and celiac panel -- Colonoscopy assuming infectious evaluation negative; we reviewed the risk, benefits and alternatives and he is agreeable and wishes to proceed.  I would not expect him to have microscopic colitis with his current immunosuppression but this needs to be excluded if the above testing unrevealing -- It is possible that he has a postinfectious IBS type diarrhea which we could consider rifaximin for after above work-up.  If rifaximin was not helpful I would consider colestipol or cholestyramine thereafter -- In the  meantime will give prescription for Lomotil 1 tablet 3 times daily as needed  2.  History of adenomatous colon polyps --overdue for surveillance colonoscopy.  Colonoscopy will be scheduled.  We reviewed the  risk, benefits and alternatives and he is agreeable and wishes to proceed.  3.  Kidney transplant --follows with transplant nephrology and currently on mycophenolate, tacrolimus and prednisone.  He uses Bactrim for prophylaxis 3 times a week  TV:[id number], Caren Griffins, [place],  [place] [id number]

## 2020-11-09 NOTE — Patient Instructions (Signed)
You have been scheduled for a colonoscopy. Please follow written instructions given to you at your visit today.  Please pick up your prep supplies at the pharmacy within the next 1-3 days. If you use inhalers (even only as needed), please bring them with you on the day of your procedure.  Your provider has requested that you go to the basement level for lab work before leaving today. Press "B" on the elevator. The lab is located at the first door on the left as you exit the elevator.  We have sent the following medications to your pharmacy for you to pick up at your convenience: Lomotil  If you are age 21 or younger, your body mass index should be between 19-25. Your Body mass index is 29.41 kg/m. If this is out of the aformentioned range listed, please consider follow up with your Primary Care Provider.  ________________________________________________________  The [facility] providers would like to encourage you to use [facility] to communicate with providers for non-urgent requests or questions.  Due to long hold times on the telephone, sending your provider a message by [facility] may be a faster and more efficient way to get a response.  Please allow 48 business hours for a response.  Please remember that this is for non-urgent requests.   Due to recent changes in healthcare laws, you may see the results of your imaging and laboratory studies on MyChart before your provider has had a chance to review them.  We understand that in some cases there may be results that are confusing or concerning to you. Not all laboratory results come back in the same time frame and the provider may be waiting for multiple results in order to interpret others.  Please give [place] 48 hours in order for your provider to thoroughly review all the results before contacting the office for clarification of your results.

## 2020-11-10 LAB — IGA: Immunoglobulin A: 146 mg/dL (ref 47–310)

## 2020-11-10 LAB — TISSUE TRANSGLUTAMINASE, IGA: (tTG) Ab, IgA: <1 U/mL

## 2020-11-13 ENCOUNTER — Telehealth: Payer: Self-pay

## 2020-11-13 NOTE — Telephone Encounter (Signed)
-----   Message from Jerene Bears, MD sent at 11/13/2020 10:39 AM EDT ----- Neg celiac panel Await stools studies

## 2020-11-13 NOTE — Telephone Encounter (Signed)
Left message to please call back. °

## 2020-11-22 ENCOUNTER — Other Ambulatory Visit: Payer: Managed Care, Other (non HMO)

## 2020-11-22 DIAGNOSIS — K529 Noninfective gastroenteritis and colitis, unspecified: Secondary | ICD-10-CM

## 2020-11-26 LAB — GI PROFILE, STOOL, PCR

## 2020-11-27 ENCOUNTER — Other Ambulatory Visit: Payer: Self-pay | Admitting: Internal Medicine

## 2020-11-27 DIAGNOSIS — D849 Immunodeficiency, unspecified: Secondary | ICD-10-CM

## 2020-11-29 LAB — FECAL LACTOFERRIN, QUANT
Fecal Lactoferrin: NEGATIVE
MICRO NUMBER:: 12011038
SPECIMEN QUALITY:: ADEQUATE

## 2020-11-29 LAB — PANCREATIC ELASTASE, FECAL: Pancreatic Elastase-1, Stool: 76 mcg/g — ABNORMAL LOW

## 2020-12-06 ENCOUNTER — Encounter: Payer: Self-pay | Admitting: Internal Medicine

## 2020-12-06 ENCOUNTER — Ambulatory Visit: Payer: Managed Care, Other (non HMO) | Admitting: Internal Medicine

## 2020-12-06 ENCOUNTER — Other Ambulatory Visit: Payer: Self-pay

## 2020-12-06 VITALS — BP 120/81 | HR 71 | Temp 98.2°F | Wt 251.2 lb

## 2020-12-06 DIAGNOSIS — Z940 Kidney transplant status: Secondary | ICD-10-CM

## 2020-12-06 DIAGNOSIS — A0811 Acute gastroenteropathy due to Norwalk agent: Secondary | ICD-10-CM | POA: Diagnosis not present

## 2020-12-06 NOTE — Progress Notes (Signed)
[facility]  Reason for Consult:Norovirus  Referring Provider: Dr Hilarie Fredrickson   HPI:    [name]. is a 60 y.o. male with PMHx as below who presents to the clinic for further evaluation of norovirus.   Patient was referred by his gastroenterologist in the setting of diarrhea and persistent norovirus positivity on GI pathogen panel.  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  He received a second deceased donor kidney transplant in June 2017.  Post transplant course was complicated by norovirus infection in 2019 status post treatment with nitazoxanide and IVIG on about 2 occasions.  His immunosuppression was reduced at that time as well.  He recently saw his gastroenterologist on 11/09/2020 and described loose and watery bowel movements.  He also endorsed weight loss, decreased appetite, and fatigue.  He does not have any nausea, vomiting, or early satiety.  He is currently on Prograf, mycophenolate, and prednisone.  He also takes Bactrim for PCP prophylaxis.  A GI pathogen panel was obtained which was again positive for norovirus.  His transplant nephrologist team at [facility] has arranged for IVIG infusions and nitazoxanide x 21 days.  He presents for follow-up here.  He has been taking the nitazoxanide now for about 1 week and planned to take for 21 days.  He has not yet had an IVIG infusion.  He reports since starting this medication he is still having loose stool but not as frequent of symptoms.  His rumbling in his stomach has subsided some.      Patient's Medications  New Prescriptions   No medications on file  Previous Medications   AMLODIPINE (NORVASC) 5 MG TABLET    Take 10 mg by mouth daily.   ASPIRIN EC 81 MG TABLET     Take 81 mg by mouth daily.   DIPHENOXYLATE-ATROPINE (LOMOTIL) 2.5-0.025 MG TABLET    Take 1 tablet by mouth 3 (three) times daily as needed for diarrhea or loose stools.   MYCOPHENOLATE (MYFORTIC) 180 MG EC TABLET    Take 3 tablets by mouth 2 (two) times daily.   NITAZOXANIDE (ALINIA) 500 MG TABLET    Take by mouth.   PEG-KCL-NACL-NASULF-NA ASC-C (PLENVU) 140 G SOLR    Take 1 kit by mouth as directed. Use coupon: BIN: [id number] PNC: CNRX Group: [id number] ID: [id number]   PREDNISONE (DELTASONE) 5 MG TABLET    Take 10 mg by mouth daily.   SODIUM BICARBONATE 650 MG TABLET    Take 650 mg by mouth 2 (two) times daily.   SULFAMETHOXAZOLE-TRIMETHOPRIM (BACTRIM,SEPTRA) 400-80 MG TABLET    Take by mouth. 1 tablet Monday, Wednesday, Friday   TACROLIMUS (PROGRAF) 0.5 MG CAPSULE    Take 0.5 mg by mouth 2 (two) times daily.   TACROLIMUS (PROGRAF) 1 MG CAPSULE    Take 1.5 mg by mouth 2 (two) times daily.   VITAMIN A PO    Take 1 capsule by mouth daily.  Modified Medications   No medications on file  Discontinued Medications   No medications on file      Past Medical History:  Diagnosis Date   Anemia    Chronic kidney disease    dialysis M<W<F-[facility]    Dialysis AV fistula infection ([place])  left forearm   Diarrhea    med   Diverticulosis    Failed kidney transplant    started dialysis 02-2013 after failure   Gout due to renal impairment    history (04/28/2012)   H/O kidney transplant    takes Cellcept and Prograf daily-off as transplant failed    History of blood transfusion    no abnormal reaction noted   History of bronchitis    unsure of how long ago   History of gout    but no meds required   Hyperlipidemia    borderline but no meds   Hypertension    but doesn't take any meds   Other back pain    Disc problems - age 89 y/o (1981)   Pneumonia    hx of;last time 2010   Shortness of breath    With exertion   Skin cancer    Tingling    fingers r/t meds he takes    Tubular adenoma of colon     Social History   Tobacco Use   Smoking status: Former    Packs/day: 0.25    Years: 20.00    Pack years: 5.00    Types: Cigarettes   Smokeless tobacco: Never   Tobacco comments:    social smoker   Vaping Use   Vaping Use: Never used  Substance Use Topics   Alcohol use: Not Currently    Comment: minimal-rare    Drug use: No    Family History  Problem Relation Age of Onset   Sjogren's syndrome Mother    Pancreatic cancer Maternal Grandmother    Colon cancer Neg Hx    Colon polyps Neg Hx    Rectal cancer Neg Hx    Stomach cancer Neg Hx    Esophageal cancer Neg Hx    Liver disease Neg Hx    Inflammatory bowel disease Neg Hx     Allergies  Allergen Reactions   Tape Rash    The patient stated that he really doesn't think this is a problem now. Paper tape ok    Review of Systems  Constitutional:  Negative for chills and fever.  Gastrointestinal:  Positive for diarrhea. Negative for abdominal pain, nausea and vomiting.     OBJECTIVE:    Vitals:   12/06/20 1516  BP: 120/81  Pulse: 71  Temp: 98.2 F (36.8 C)  TempSrc: Oral  Weight: 251 lb 3.2 oz (113.9 kg)     Body mass index is 29.79 kg/m.  Physical Exam Constitutional:      General: He is not in acute distress.    Appearance: Normal appearance.  HENT:     Head: Normocephalic and atraumatic.  Pulmonary:     Effort: Pulmonary effort is normal. No respiratory distress.  Abdominal:     General: Bowel sounds are normal. There is no distension.     Palpations: Abdomen is soft.     Tenderness: There is no abdominal tenderness.  Neurological:     General: No focal deficit present.     Mental Status: He is alert and oriented to person, place, and time.  Psychiatric:        Mood and Affect: Mood normal.        Behavior: Behavior normal.     Labs and Microbiology:  CBC Latest Ref Rng & Units 10/17/2015 10/16/2015 10/15/2015  WBC 4.0 - 10.5 K/uL 6.3 7.2 7.4  Hemoglobin 13.0 -  17.0 g/dL 10.6(L) 9.7(L) 11.0(L)  Hematocrit 39.0 -  52.0 % 32.1(L) 29.9(L) 32.7(L)  Platelets 150 - 400 K/uL 218 192 206   CMP Latest Ref Rng & Units 10/17/2015 10/16/2015 10/15/2015  Glucose 65 - 99 mg/dL 98 88 -  BUN 6 - 20 mg/dL 51(H) 101(H) -  Creatinine 0.61 - 1.24 mg/dL 9.91(H) 14.56(H) 13.35(H)  Sodium 135 - 145 mmol/L 137 137 -  Potassium 3.5 - 5.1 mmol/L 5.3(H) 5.6(H) -  Chloride 101 - 111 mmol/L 95(L) 95(L) -  CO2 22 - 32 mmol/L 23 20(L) -  Calcium 8.9 - 10.3 mg/dL 9.7 9.1 -  Total Protein 6.5 - 8.1 g/dL - - -  Total Bilirubin 0.3 - 1.2 mg/dL - - -  Alkaline Phos 38 - 126 U/L - - -  AST 15 - 41 U/L - - -  ALT 17 - 63 U/L - - -        ASSESSMENT & PLAN:      1. H/O kidney transplant Follows with transplant nephrology at [facility].  Current immunosuppression is mycophenolate, tacrolimus, prednisone.  He is also on Bactrim for PCP prophylaxis.  2. 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  Challenging situation and would recommend continued follow-up with his transplant providers to see about decreasing his immunosuppression any further if possible.  They have already initiated treatment with nitazoxanide and IVIG.  Unfortunately, I do not have much to offer beyond this in this scenario.    Follow up PRN or with ID at transplant center.   Raynelle [facility] for [facility]ious [facility] 12/06/2020, 3:42 PM

## 2020-12-06 NOTE — Patient Instructions (Signed)
Continue the Nitazoxanide as prescribed by your transplant doctors as well as IVIG.  Talk to them about the possibilty of lowering your immunosuppression and maybe consider seeing transplant ID at [facility].  Follow up with me as needed.

## 2020-12-20 ENCOUNTER — Ambulatory Visit (AMBULATORY_SURGERY_CENTER): Payer: Managed Care, Other (non HMO) | Admitting: Internal Medicine

## 2020-12-20 ENCOUNTER — Encounter: Payer: Self-pay | Admitting: Internal Medicine

## 2020-12-20 ENCOUNTER — Other Ambulatory Visit: Payer: Self-pay

## 2020-12-20 VITALS — BP 189/93 | HR 57 | Temp 97.7°F | Resp 22 | Ht 77.0 in | Wt 248.0 lb

## 2020-12-20 DIAGNOSIS — K529 Noninfective gastroenteritis and colitis, unspecified: Secondary | ICD-10-CM | POA: Diagnosis present

## 2020-12-20 DIAGNOSIS — K5730 Diverticulosis of large intestine without perforation or abscess without bleeding: Secondary | ICD-10-CM | POA: Diagnosis not present

## 2020-12-20 DIAGNOSIS — Z86010 Personal history of colonic polyps: Secondary | ICD-10-CM

## 2020-12-20 MED ORDER — SODIUM CHLORIDE 0.9 % IV SOLN: 500.0000 mL | Freq: Once | INTRAVENOUS | Status: DC

## 2020-12-20 NOTE — Op Note (Signed)
[place] Patient Name: Mario Woods Procedure Date: 12/20/2020 9:09 AM MRN: [id number] Endoscopist: Jerene Bears , MD Age: 60 Referring MD:  Date of Birth: Feb 04, 1961 Gender: Male Account #: [id number] Procedure:                Colonoscopy Indications:              Chronic diarrhea, history of adenomatous polyps in                            the colon at colonoscopy in June 2016, history of                            recurrent stool testing + for Norovirus - treated                            with Alinia and IVIG with some improvement in                            diarrhea Medicines:                Monitored Anesthesia Care Procedure:                Pre-Anesthesia Assessment:                           - Prior to the procedure, a History and Physical                            was performed, and patient medications and                            allergies were reviewed. The patient's tolerance of                            previous anesthesia was also reviewed. The risks                            and benefits of the procedure and the sedation                            options and risks were discussed with the patient.                            All questions were answered, and informed consent                            was obtained. Prior Anticoagulants: The patient has                            taken no previous anticoagulant or antiplatelet                            agents. ASA Grade Assessment: III - A patient with  severe systemic disease. After reviewing the risks                            and benefits, the patient was deemed in                            satisfactory condition to undergo the procedure.                           After obtaining informed consent, the colonoscope                            was passed under direct vision. Throughout the                            procedure, the patient's blood pressure, pulse, and                             oxygen saturations were monitored continuously. The                            CF [id number] #[id number] was introduced through the anus                            and advanced to the hepatic flexure. The                            colonoscopy was performed with difficulty due to                            colonic length, a redundant colon and significant                            looping. The patient tolerated the procedure well.                            The quality of the bowel preparation was good. The                            rectum was photographed. Scope In: 9:19:20 AM Scope Out: 9:48:37 AM Total Procedure Duration: 0 hours 29 minutes 17 seconds  Findings:                 The digital rectal exam was normal.                           The colon was redundant with significant looping.                            Incomplete examination despite counter pressure,                            water immersion. Colon examined to distal ascending  colon/hepatic flexure.                           Multiple small and large-mouthed diverticula were                            found in the sigmoid colon, descending colon and                            transverse colon.                           Normal mucosa was found in the entire examined                            colon. Biopsies for histology were taken with a                            cold forceps from the transverse colon, descending                            colon and sigmoid colon for evaluation of                            microscopic colitis.                           The retroflexed view of the distal rectum and anal                            verge was normal and showed no anal or rectal                            abnormalities. Complications:            No immediate complications. Estimated Blood Loss:     Estimated blood loss was minimal. Impression:               - Redundant colon.  Incomplete exam today as above.                           - Diverticulosis in the sigmoid colon, in the                            descending colon and in the transverse colon.                           - Normal mucosa in the entire examined colon.                            Biopsied.                           - The distal rectum and anal verge are normal on  retroflexion view. Recommendation:           - Patient has a contact number available for                            emergencies. The signs and symptoms of potential                            delayed complications were discussed with the                            patient. Return to normal activities tomorrow.                            Written discharge instructions were provided to the                            patient.                           - Resume previous diet.                           - Continue present medications.                           - Await pathology results.                           - CT colonoscopy recommended given incomplete exam                            today and patient's personal history of adenomatous                            colon polyps. Jerene Bears, MD 12/20/2020 9:58:08 AM This report has been signed electronically.

## 2020-12-20 NOTE — Patient Instructions (Signed)
Thank you for letting us take care of your healthcare needs today. °Please see handouts given to you on Diverticulosis. ° ° ° °YOU HAD AN ENDOSCOPIC PROCEDURE TODAY AT THE [facility]:   Refer to the procedure report that was given to you for any specific questions about what was found during the examination.  If the procedure report does not answer your questions, please call your gastroenterologist to clarify.  If you requested that your care partner not be given the details of your procedure findings, then the procedure report has been included in a sealed envelope for you to review at your convenience later. ° °YOU SHOULD EXPECT: Some feelings of bloating in the abdomen. Passage of more gas than usual.  Walking can help get rid of the air that was put into your GI tract during the procedure and reduce the bloating. If you had a lower endoscopy (such as a colonoscopy or flexible sigmoidoscopy) you may notice spotting of blood in your stool or on the toilet paper. If you underwent a bowel prep for your procedure, you may not have a normal bowel movement for a few days. ° °Please Note:  You might notice some irritation and congestion in your nose or some drainage.  This is from the oxygen used during your procedure.  There is no need for concern and it should clear up in a day or so. ° °SYMPTOMS TO REPORT IMMEDIATELY: ° °Following lower endoscopy (colonoscopy or flexible sigmoidoscopy): ° Excessive amounts of blood in the stool ° Significant tenderness or worsening of abdominal pains ° Swelling of the abdomen that is new, acute ° Fever of 100°F or higher ° °For urgent or emergent issues, a gastroenterologist can be reached at any hour by calling [phone]. °Do not use MyChart messaging for urgent concerns.  ° ° °DIET:  We do recommend a small meal at first, but then you may proceed to your regular diet.  Drink plenty of fluids but you should avoid alcoholic beverages for 24 hours. ° °ACTIVITY:   You should plan to take it easy for the rest of today and you should NOT DRIVE or use heavy machinery until tomorrow (because of the sedation medicines used during the test).   ° °FOLLOW UP: °Our staff will call the number listed on your records 48-72 hours following your procedure to check on you and address any questions or concerns that you may have regarding the information given to you following your procedure. If we do not reach you, we will leave a message.  We will attempt to reach you two times.  During this call, we will ask if you have developed any symptoms of COVID 19. If you develop any symptoms (ie: fever, flu-like symptoms, shortness of breath, cough etc.) before then, please call [phone].  If you test positive for Covid 19 in the 2 weeks post procedure, please call and report this information to us.   ° °If any biopsies were taken you will be contacted by phone or by letter within the next 1-3 weeks.  Please call us at [phone] if you have not heard about the biopsies in 3 weeks.  ° ° °SIGNATURES/CONFIDENTIALITY: °You and/or your care partner have signed paperwork which will be entered into your electronic medical record.  These signatures attest to the fact that that the information above on your After Visit Summary has been reviewed and is understood.  Full responsibility of the confidentiality of this discharge information lies with you   and/or your care-partner.  °

## 2020-12-20 NOTE — Progress Notes (Signed)
Pt's states no medical or surgical changes since previsit or office visit. 

## 2020-12-20 NOTE — Progress Notes (Signed)
Report to PACU, RN, vss, BBS= Clear.  

## 2020-12-20 NOTE — Progress Notes (Signed)
Called to room to assist during endoscopic procedure.  Patient ID and intended procedure confirmed with present staff. Received instructions for my participation in the procedure from the performing physician.  

## 2020-12-20 NOTE — Progress Notes (Signed)
[name] vital signs. 

## 2020-12-21 ENCOUNTER — Telehealth: Payer: Self-pay

## 2020-12-21 ENCOUNTER — Other Ambulatory Visit: Payer: Self-pay

## 2020-12-21 DIAGNOSIS — Z539 Procedure and treatment not carried out, unspecified reason: Secondary | ICD-10-CM

## 2020-12-21 NOTE — Telephone Encounter (Signed)
Pt scheduled for virtual colon at [facility] 01/19/21 at 9:40am, pt to arrive there at 9:20am. Pt to pick up the prep at this same location between now and 01/12/21. Scan scheduled at [place] located at [place].

## 2020-12-22 ENCOUNTER — Telehealth: Payer: Self-pay | Admitting: *Deleted

## 2020-12-22 NOTE — Telephone Encounter (Signed)
  Follow up Call-  Call back number 12/20/2020  Post procedure Call Back phone  # [phone]  Permission to leave phone message Yes  Some recent data might be hidden     Patient questions:  Do you have a fever, pain , or abdominal swelling? No. Pain Score  0 *  Have you tolerated food without any problems? Yes.    Have you been able to return to your normal activities? Yes.    Do you have any questions about your discharge instructions: Diet   No. Medications  No. Follow up visit  No.  Do you have questions or concerns about your Care? No.  Actions: * If pain score is 4 or above: No action needed, pain <4.  Have you developed a fever since your procedure? no  2.   Have you had an respiratory symptoms (SOB or cough) since your procedure? no  3.   Have you tested positive for COVID 19 since your procedure no  4.   Have you had any family members/close contacts diagnosed with the COVID 19 since your procedure?  no   If yes to any of these questions please route to Joylene John, RN and Joella Prince, RN

## 2020-12-31 ENCOUNTER — Encounter: Payer: Self-pay | Admitting: Internal Medicine

## 2021-01-15 ENCOUNTER — Telehealth: Payer: Self-pay

## 2021-01-15 NOTE — Telephone Encounter (Signed)
Pt  calling states he is scheduled for a virtual colon and he has had a kidney transplant. His nephrologist stated he may need IV fluids before and after the scan. Pt given the phone number for [facility] to call and check with them regarding this issue.

## 2021-01-19 ENCOUNTER — Inpatient Hospital Stay: Admission: RE | Admit: 2021-01-19 | Payer: Managed Care, Other (non HMO) | Source: Ambulatory Visit

## 2021-02-15 ENCOUNTER — Ambulatory Visit
Admission: RE | Admit: 2021-02-15 | Discharge: 2021-02-15 | Disposition: A | Discharge status: Home / Self Care | Payer: Managed Care, Other (non HMO) | Source: Ambulatory Visit | Attending: Internal Medicine | Admitting: Internal Medicine

## 2021-02-15 DIAGNOSIS — Z539 Procedure and treatment not carried out, unspecified reason: Secondary | ICD-10-CM

## 2022-05-10 ENCOUNTER — Encounter: Payer: Self-pay | Admitting: Surgery

## 2022-05-14 ENCOUNTER — Other Ambulatory Visit: Payer: Self-pay | Admitting: *Deleted

## 2022-05-14 DIAGNOSIS — N186 End stage renal disease: Secondary | ICD-10-CM

## 2022-05-20 ENCOUNTER — Ambulatory Visit (INDEPENDENT_AMBULATORY_CARE_PROVIDER_SITE_OTHER)
Admission: RE | Admit: 2022-05-20 | Discharge: 2022-05-20 | Disposition: A | Discharge status: Home / Self Care | Payer: Managed Care, Other (non HMO) | Source: Ambulatory Visit | Attending: Vascular Surgery | Admitting: Vascular Surgery

## 2022-05-20 ENCOUNTER — Encounter: Payer: Self-pay | Admitting: Surgery

## 2022-05-20 ENCOUNTER — Ambulatory Visit (INDEPENDENT_AMBULATORY_CARE_PROVIDER_SITE_OTHER): Payer: Managed Care, Other (non HMO) | Admitting: Surgery

## 2022-05-20 ENCOUNTER — Ambulatory Visit (HOSPITAL_COMMUNITY)
Admission: RE | Admit: 2022-05-20 | Discharge: 2022-05-20 | Disposition: A | Discharge status: Home / Self Care | Payer: Managed Care, Other (non HMO) | Source: Ambulatory Visit | Attending: Vascular Surgery | Admitting: Vascular Surgery

## 2022-05-20 VITALS — BP 137/90 | HR 63 | Temp 98.3°F | Resp 20 | Ht 77.0 in | Wt 296.0 lb

## 2022-05-20 DIAGNOSIS — Z992 Dependence on renal dialysis: Secondary | ICD-10-CM | POA: Insufficient documentation

## 2022-05-20 DIAGNOSIS — N186 End stage renal disease: Secondary | ICD-10-CM

## 2022-05-20 DIAGNOSIS — N184 Chronic kidney disease, stage 4 (severe): Secondary | ICD-10-CM | POA: Diagnosis not present

## 2022-05-20 NOTE — H&P (View-Only) (Signed)
[facility] [place]  Patient name: Mario Woods. MRN: [id number] DOB: 28-Aug-1960 Sex: male   REQUESTING PROVIDER:    Dr. Marval Regal   REASON FOR CONSULT:    Dialysis access  HISTORY OF PRESENT ILLNESS:   Mario Stemen. is a 61 y.o. male, who is brought for dialysis access.  He is right-handed.  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  He has had a prior radiocephalic fistula that is now occluded.  He needs new access.  He does not have a defibrillator or pacemaker.  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  He is medically managed for hypertension.  He is a former smoker  PAST MEDICAL HISTORY    Past Medical History:  Diagnosis Date   Anemia    Chronic kidney disease    d[facility]    Dialysis AV fistula infection ([place])    left forearm   Diarrhea    med   Diverticulosis    Failed kidney transplant    started dialysis 02-2013 after failure   Gout due to renal impairment    history (04/28/2012)   H/O kidney transplant    takes Cellcept and Prograf daily-off as transplant failed    History of blood transfusion    no abnormal reaction noted   History of bronchitis    unsure of how long ago   History of gout    but no meds required   Hyperlipidemia    borderline but no meds   Hypertension    but doesn't take any meds   Other back pain    Disc problems - age 80 y/o (56)   Pneumonia    hx of;last time 2010   Shortness of breath    With exertion   Skin cancer    Tingling    fingers r/t meds he takes   Tubular adenoma of colon      FAMILY HISTORY   Family History  Problem Relation Age of Onset   Sjogren's syndrome Mother    Pancreatic cancer Maternal Grandmother    Colon cancer Neg Hx    Colon polyps Neg Hx    Rectal cancer Neg Hx    Stomach cancer Neg Hx    Esophageal  cancer Neg Hx    Liver disease Neg Hx    Inflammatory bowel disease Neg Hx     SOCIAL HISTORY:   Social History   Socioeconomic History   Marital status: Married    Spouse name: Katy Apo   Number of children: 5   Years of education: BS   Highest education level: Not on file  Occupational History   Occupation: [employer]  Tobacco Use   Smoking status: Former    Packs/day: 0.25    Years: 20.00    Total pack years: 5.00    Types: Cigarettes   Smokeless tobacco: Never   Tobacco comments:    social smoker   Media planner   Vaping Use: Never used  Substance and Sexual Activity   Alcohol use: Not Currently    Comment: minimal-rare    Drug use: No   Sexual activity: Yes    Birth control/protection: Inserts  Other Topics Concern   Not on file  Social History Narrative   Lives at home with wife and children.   Caffeine use: occassional    Social Determinants of  Health   Financial Resource Strain: Not on file  Food Insecurity: Not on file  Transportation Needs: Not on file  Physical Activity: Not on file  Stress: Not on file  Social Connections: Not on file  Intimate Partner Violence: Not on file    ALLERGIES:    Allergies  Allergen Reactions   Tape Rash    The patient stated that he really doesn't think this is a problem now. Paper tape ok    CURRENT MEDICATIONS:    Current Outpatient Medications  Medication Sig Dispense Refill   aspirin EC 81 MG tablet Take 81 mg by mouth daily.     calcitRIOL (ROCALTROL) 0.25 MCG capsule Take by mouth.     labetalol (NORMODYNE) 200 MG tablet Take by mouth.     magnesium oxide (MAG-OX) 400 MG tablet Take by mouth.     predniSONE (DELTASONE) 5 MG tablet Take 10 mg by mouth daily.     sodium bicarbonate 650 MG tablet Take 650 mg by mouth 2 (two) times daily.     sodium zirconium cyclosilicate (LOKELMA) 10 g PACK packet Take by mouth.     tacrolimus (PROGRAF) 1 MG capsule Take 2 mg by mouth 2 (two) times daily.     No  current facility-administered medications for this visit.    REVIEW OF SYSTEMS:   '[X]'$  denotes positive finding, '[ ]'$  denotes negative finding Cardiac  Comments:  Chest pain or chest pressure:    Shortness of breath upon exertion:    Short of breath when lying flat:    Irregular heart rhythm:        Vascular    Pain in calf, thigh, or hip brought on by ambulation:    Pain in feet at night that wakes you up from your sleep:     Blood clot in your veins:    Leg swelling:         Pulmonary    Oxygen at home:    Productive cough:     Wheezing:         Neurologic    Sudden weakness in arms or legs:     Sudden numbness in arms or legs:     Sudden onset of difficulty speaking or slurred speech:    Temporary loss of vision in one eye:     Problems with dizziness:         Gastrointestinal    Blood in stool:      Vomited blood:         Genitourinary    Burning when urinating:     Blood in urine:        Psychiatric    Major depression:         Hematologic    Bleeding problems:    Problems with blood clotting too easily:        Skin    Rashes or ulcers:        Constitutional    Fever or chills:     PHYSICAL EXAM:   Vitals:   05/20/22 1330  BP: (!) 137/90  Pulse: 63  Resp: 20  Temp: 98.3 F (36.8 C)  SpO2: 97%  Weight: 296 lb (134.3 kg)  Height: '6\' 5"'$  (1.956 m)    GENERAL: The patient is a well-nourished male, in no acute distress. The vital signs are documented above. CARDIAC: There is a regular rate and rhythm.  VASCULAR: Palpable left brachial pulse PULMONARY: Nonlabored respirations ABDOMEN: Soft and non-tender with normal pitched  bowel sounds.  MUSCULOSKELETAL: There are no major deformities or cyanosis. NEUROLOGIC: No focal weakness or paresthesias are detected. SKIN: There are no ulcers or rashes noted. PSYCHIATRIC: The patient has a normal affect.  STUDIES:   I have reviewed the following: Arterial studies: Triphasic  waveforms  +-----------------+-------------+----------+---------+  Right Cephalic   Diameter (cm)Depth (cm)Findings   +-----------------+-------------+----------+---------+  Shoulder            0.51        1.29              +-----------------+-------------+----------+---------+  Prox upper arm       0.43        0.64              +-----------------+-------------+----------+---------+  Mid upper arm        0.46        0.42              +-----------------+-------------+----------+---------+  Dist upper arm       0.51        0.48   branching  +-----------------+-------------+----------+---------+  Antecubital fossa    0.59        0.26              +-----------------+-------------+----------+---------+  Prox forearm         0.33        0.61   branching  +-----------------+-------------+----------+---------+  Mid forearm          0.35        0.48              +-----------------+-------------+----------+---------+  Wrist               0.35        0.28              +-----------------+-------------+----------+---------+   +-----------------+-------------+----------+--------+  Right Basilic    Diameter (cm)Depth (cm)Findings  +-----------------+-------------+----------+--------+  Prox upper arm       0.68        1.75             +-----------------+-------------+----------+--------+  Mid upper arm        0.59        2.12             +-----------------+-------------+----------+--------+  Dist upper arm       0.69        0.79             +-----------------+-------------+----------+--------+  Antecubital fossa    0.55        0.33             +-----------------+-------------+----------+--------+  Prox forearm         0.25        0.35             +-----------------+-------------+----------+--------+   +-----------------+-------------+----------+--------------+  Left Cephalic    Diameter (cm)Depth (cm)   Findings      +-----------------+-------------+----------+--------------+  Shoulder            0.53        1.27                   +-----------------+-------------+----------+--------------+  Prox upper arm       0.53        0.81                   +-----------------+-------------+----------+--------------+  Mid upper arm        0.55  0.57                   +-----------------+-------------+----------+--------------+  Dist upper arm       0.55        0.53                   +-----------------+-------------+----------+--------------+  Antecubital fossa    0.61        0.44                   +-----------------+-------------+----------+--------------+  Prox forearm         0.57        0.63     branching     +-----------------+-------------+----------+--------------+  Mid forearm                             not visualized  +-----------------+-------------+----------+--------------+  Dist forearm                            not visualized  +-----------------+-------------+----------+--------------+  Wrist                                  not visualized  +-----------------+-------------+----------+--------------+   +-----------------+-------------+----------+--------------+  Left Basilic     Diameter (cm)Depth (cm)   Findings     +-----------------+-------------+----------+--------------+  Mid upper arm        0.62        2.77                   +-----------------+-------------+----------+--------------+  Dist upper arm       0.65        2.57                   +-----------------+-------------+----------+--------------+  Antecubital fossa                       not visualized  +-----------------+-------------+----------+--------------+  Prox forearm                            not visualized  +-----------------+-------------+----------+--------------+  Mid forearm                             not visualized   +-----------------+-------------+----------+--------------+  Distal forearm                          not visualized  +-----------------+-------------+----------+--------------+  Wrist                                  not visualized   ASSESSMENT and PLAN   Failing kidney transplant: I discussed with the patient that based off of his vein maps, he would be a candidate for left brachiocephalic fistula.  He is trying to get a skin cancer from his scalp removed as he is worried about invasion into the bone.  He does not have a appointment with ENT yet.  This is a top priority for him, however I discussed that it would probably take some time to get this arranged and so in the meantime, I have recommended that we get this fistula placed because it will likely be  at least 3 months before it is usable.  I discussed the risk of steal, and the need for future interventions.  All questions were answered.  He will be scheduled sometime between Christmas and New Year's.   Leia Alf, MD, FACS [facility] [phone] Pager [phone]

## 2022-05-20 NOTE — Progress Notes (Signed)
[facility] of [place]  Patient name: Mario Woods. MRN: [id number] DOB: 1961/05/20 Sex: male   REQUESTING PROVIDER:    Dr. Marval Regal   REASON FOR CONSULT:    Dialysis access  HISTORY OF PRESENT ILLNESS:   Mario Woods. is a 61 y.o. male, who is brought for dialysis access.  He is right-handed.  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  He has had a prior radiocephalic fistula that is now occluded.  He needs new access.  He does not have a defibrillator or pacemaker.  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  He is medically managed for hypertension.  He is a former smoker  PAST MEDICAL HISTORY    Past Medical History:  Diagnosis Date   Anemia    Chronic kidney disease    [facility]    Dialysis AV fistula infection ([place])    left forearm   Diarrhea    med   Diverticulosis    Failed kidney transplant    started dialysis 02-2013 after failure   Gout due to renal impairment    history (04/28/2012)   H/O kidney transplant    takes Cellcept and Prograf daily-off as transplant failed    History of blood transfusion    no abnormal reaction noted   History of bronchitis    unsure of how long ago   History of gout    but no meds required   Hyperlipidemia    borderline but no meds   Hypertension    but doesn't take any meds   Other back pain    Disc problems - age 45 y/o (71)   Pneumonia    hx of;last time 2010   Shortness of breath    With exertion   Skin cancer    Tingling    fingers r/t meds he takes   Tubular adenoma of colon      FAMILY HISTORY   Family History  Problem Relation Age of Onset   Sjogren's syndrome Mother    Pancreatic cancer Maternal Grandmother    Colon cancer Neg Hx    Colon polyps Neg Hx    Rectal cancer Neg Hx    Stomach cancer Neg Hx    Esophageal  cancer Neg Hx    Liver disease Neg Hx    Inflammatory bowel disease Neg Hx     SOCIAL HISTORY:   Social History   Socioeconomic History   Marital status: Married    Spouse name: Katy Apo   Number of children: 5   Years of education: BS   Highest education level: Not on file  Occupational History   Occupation: [employer]  Tobacco Use   Smoking status: Former    Packs/day: 0.25    Years: 20.00    Total pack years: 5.00    Types: Cigarettes   Smokeless tobacco: Never   Tobacco comments:    social smoker   Media planner   Vaping Use: Never used  Substance and Sexual Activity   Alcohol use: Not Currently    Comment: minimal-rare    Drug use: No   Sexual activity: Yes    Birth control/protection: Inserts  Other Topics Concern   Not on file  Social History Narrative   Lives at home with wife and children.   Caffeine use: occassional    Social Determinants of  Health   Financial Resource Strain: Not on file  Food Insecurity: Not on file  Transportation Needs: Not on file  Physical Activity: Not on file  Stress: Not on file  Social Connections: Not on file  Intimate Partner Violence: Not on file    ALLERGIES:    Allergies  Allergen Reactions   Tape Rash    The patient stated that he really doesn't think this is a problem now. Paper tape ok    CURRENT MEDICATIONS:    Current Outpatient Medications  Medication Sig Dispense Refill   aspirin EC 81 MG tablet Take 81 mg by mouth daily.     calcitRIOL (ROCALTROL) 0.25 MCG capsule Take by mouth.     labetalol (NORMODYNE) 200 MG tablet Take by mouth.     magnesium oxide (MAG-OX) 400 MG tablet Take by mouth.     predniSONE (DELTASONE) 5 MG tablet Take 10 mg by mouth daily.     sodium bicarbonate 650 MG tablet Take 650 mg by mouth 2 (two) times daily.     sodium zirconium cyclosilicate (LOKELMA) 10 g PACK packet Take by mouth.     tacrolimus (PROGRAF) 1 MG capsule Take 2 mg by mouth 2 (two) times daily.     No  current facility-administered medications for this visit.    REVIEW OF SYSTEMS:   '[X]'$  denotes positive finding, '[ ]'$  denotes negative finding Cardiac  Comments:  Chest pain or chest pressure:    Shortness of breath upon exertion:    Short of breath when lying flat:    Irregular heart rhythm:        Vascular    Pain in calf, thigh, or hip brought on by ambulation:    Pain in feet at night that wakes you up from your sleep:     Blood clot in your veins:    Leg swelling:         Pulmonary    Oxygen at home:    Productive cough:     Wheezing:         Neurologic    Sudden weakness in arms or legs:     Sudden numbness in arms or legs:     Sudden onset of difficulty speaking or slurred speech:    Temporary loss of vision in one eye:     Problems with dizziness:         Gastrointestinal    Blood in stool:      Vomited blood:         Genitourinary    Burning when urinating:     Blood in urine:        Psychiatric    Major depression:         Hematologic    Bleeding problems:    Problems with blood clotting too easily:        Skin    Rashes or ulcers:        Constitutional    Fever or chills:     PHYSICAL EXAM:   Vitals:   05/20/22 1330  BP: (!) 137/90  Pulse: 63  Resp: 20  Temp: 98.3 F (36.8 C)  SpO2: 97%  Weight: 296 lb (134.3 kg)  Height: '6\' 5"'$  (1.956 m)    GENERAL: The patient is a well-nourished male, in no acute distress. The vital signs are documented above. CARDIAC: There is a regular rate and rhythm.  VASCULAR: Palpable left brachial pulse PULMONARY: Nonlabored respirations ABDOMEN: Soft and non-tender with normal pitched  bowel sounds.  MUSCULOSKELETAL: There are no major deformities or cyanosis. NEUROLOGIC: No focal weakness or paresthesias are detected. SKIN: There are no ulcers or rashes noted. PSYCHIATRIC: The patient has a normal affect.  STUDIES:   I have reviewed the following: Arterial studies: Triphasic  waveforms  +-----------------+-------------+----------+---------+  Right Cephalic   Diameter (cm)Depth (cm)Findings   +-----------------+-------------+----------+---------+  Shoulder            0.51        1.29              +-----------------+-------------+----------+---------+  Prox upper arm       0.43        0.64              +-----------------+-------------+----------+---------+  Mid upper arm        0.46        0.42              +-----------------+-------------+----------+---------+  Dist upper arm       0.51        0.48   branching  +-----------------+-------------+----------+---------+  Antecubital fossa    0.59        0.26              +-----------------+-------------+----------+---------+  Prox forearm         0.33        0.61   branching  +-----------------+-------------+----------+---------+  Mid forearm          0.35        0.48              +-----------------+-------------+----------+---------+  Wrist               0.35        0.28              +-----------------+-------------+----------+---------+   +-----------------+-------------+----------+--------+  Right Basilic    Diameter (cm)Depth (cm)Findings  +-----------------+-------------+----------+--------+  Prox upper arm       0.68        1.75             +-----------------+-------------+----------+--------+  Mid upper arm        0.59        2.12             +-----------------+-------------+----------+--------+  Dist upper arm       0.69        0.79             +-----------------+-------------+----------+--------+  Antecubital fossa    0.55        0.33             +-----------------+-------------+----------+--------+  Prox forearm         0.25        0.35             +-----------------+-------------+----------+--------+   +-----------------+-------------+----------+--------------+  Left Cephalic    Diameter (cm)Depth (cm)   Findings      +-----------------+-------------+----------+--------------+  Shoulder            0.53        1.27                   +-----------------+-------------+----------+--------------+  Prox upper arm       0.53        0.81                   +-----------------+-------------+----------+--------------+  Mid upper arm        0.55  0.57                   +-----------------+-------------+----------+--------------+  Dist upper arm       0.55        0.53                   +-----------------+-------------+----------+--------------+  Antecubital fossa    0.61        0.44                   +-----------------+-------------+----------+--------------+  Prox forearm         0.57        0.63     branching     +-----------------+-------------+----------+--------------+  Mid forearm                             not visualized  +-----------------+-------------+----------+--------------+  Dist forearm                            not visualized  +-----------------+-------------+----------+--------------+  Wrist                                  not visualized  +-----------------+-------------+----------+--------------+   +-----------------+-------------+----------+--------------+  Left Basilic     Diameter (cm)Depth (cm)   Findings     +-----------------+-------------+----------+--------------+  Mid upper arm        0.62        2.77                   +-----------------+-------------+----------+--------------+  Dist upper arm       0.65        2.57                   +-----------------+-------------+----------+--------------+  Antecubital fossa                       not visualized  +-----------------+-------------+----------+--------------+  Prox forearm                            not visualized  +-----------------+-------------+----------+--------------+  Mid forearm                             not visualized   +-----------------+-------------+----------+--------------+  Distal forearm                          not visualized  +-----------------+-------------+----------+--------------+  Wrist                                  not visualized   ASSESSMENT and PLAN   Failing kidney transplant: I discussed with the patient that based off of his vein maps, he would be a candidate for left brachiocephalic fistula.  He is trying to get a skin cancer from his scalp removed as he is worried about invasion into the bone.  He does not have a appointment with ENT yet.  This is a top priority for him, however I discussed that it would probably take some time to get this arranged and so in the meantime, I have recommended that we get this fistula placed because it will likely be  at least 3 months before it is usable.  I discussed the risk of steal, and the need for future interventions.  All questions were answered.  He will be scheduled sometime between Christmas and New Year's.   Leia Alf, MD, FACS [facility] [phone] Pager [phone]

## 2022-05-21 ENCOUNTER — Other Ambulatory Visit: Payer: Self-pay

## 2022-05-21 DIAGNOSIS — N184 Chronic kidney disease, stage 4 (severe): Secondary | ICD-10-CM

## 2022-06-05 ENCOUNTER — Encounter (HOSPITAL_COMMUNITY): Payer: Self-pay | Admitting: Surgery

## 2022-06-05 ENCOUNTER — Other Ambulatory Visit: Payer: Self-pay

## 2022-06-05 NOTE — Progress Notes (Signed)
S.D.W- Instructions   Your procedure is scheduled on Fri., Dec. 29, 2023 from 7:30AM-8:50AM.  Report to [facility] Main Entrance "A" at 5:30 A.M., then check in with the Admitting office.  Call this number if you have problems the morning of surgery:  [phone]             If you experience any cold or flu symptoms such as cough, fever, chills, shortness of breath, etc. between now and your scheduled surgery, please notify us at the above         number.  Masks are now required throughout our facilities due to the increasing cases of Covid, Flu, and RSV infections.   Remember:  Do not eat after midnight on Dec. 28th    Take these medicines the morning of surgery with A SIP OF WATER: Aspirin  Labetalol (NORMODYNE)  PredniSONE (DELTASONE)  Tacrolimus (PROGRAF)   As of today, STOP taking any Aspirin (unless otherwise instructed by your surgeon) Aleve, Naproxen, Ibuprofen, Motrin, Advil, Goody's, BC's, all herbal medications, fish oil, and all vitamins.  Do not wear jewelry. Do not wear lotions, powders, cologne, or deodorant. Do not shave 48 hours prior to surgery.  Men may shave face and neck. Do not bring valuables to the hospital.  [facility] is not responsible for any belongings or valuables.    Do NOT Smoke (Tobacco/Vaping)  24 hours prior to your procedure  If you use a CPAP at night, you may bring your mask for your overnight stay.   Contacts, glasses, hearing aids, dentures or partials may not be worn into surgery, please bring cases for these belongings   For patients admitted to the hospital, discharge time will be determined by your treatment team.   Patients discharged the day of surgery will not be allowed to drive home, and someone needs to stay with them for 24 hours.  Special instructions:    Oral Hygiene is also important to reduce your risk of infection.  Remember - BRUSH YOUR TEETH THE MORNING OF SURGERY WITH YOUR REGULAR TOOTHPASTE  [place]-  Preparing For Surgery  Before surgery, you can play an important role. Because skin is not sterile, your skin needs to be as free of germs as possible. You can reduce the number of germs on your skin by washing with Antibacterial Soap before surgery.     Please follow these instructions carefully.     Shower the NIGHT BEFORE SURGERY and the MORNING OF SURGERY with Antibacterial Soap.   Pat yourself dry with a CLEAN TOWEL.  Wear CLEAN PAJAMAS to bed the night before surgery  Place CLEAN SHEETS on your bed the night before your surgery  DO NOT SLEEP WITH PETS.  Day of Surgery:  Take a shower with Antibacterial soap. Wear Clean/Comfortable clothing the morning of surgery Do not apply any deodorants/lotions.   Remember to brush your teeth WITH YOUR REGULAR TOOTHPASTE.   If you test positive for Covid, or been in contact with anyone that has tested positive in the last 10 days, please notify your surgeon.  SURGICAL WAITING ROOM VISITATION Patients having surgery or a procedure may have no more than 2 support people in the waiting area - these visitors may rotate.   Children under the age of 74 must have an adult with them who is not the patient. If the patient needs to stay at the hospital during part of their recovery, the visitor guidelines for inpatient rooms apply. Pre-op nurse will coordinate an  appropriate time for 1 support person to accompany patient in pre-op.  This support person may not rotate.   Please refer to the [facility] website for the visitor guidelines for Inpatients (after your surgery is over and you are in a regular room).

## 2022-06-05 NOTE — Progress Notes (Signed)
PCP - Denies  Cardiologist - Denies  EP- Denies  Endocrine- Denies  Nephro- Dr. Neomia Dear- Denies  Chest x-ray - 04/15/22 (E)  EKG - 04/15/22 (E)  Stress Test - Denies  ECHO - Denies  Cardiac Cath - Denies  AICD-na PM-na LOOP-na  Nerve Stimulator- Denies  Dialysis- Denies  Sleep Study - Denies CPAP - Denies  LABS- 06/07/22: I-Stat 8  ASA- Cont.  ERAS- No  HA1C- Denies  Anesthesia- Yes- EKG req'd  Pt denies having chest pain, sob, or fever during the pre-op phone call. All instructions explained to the pt, with a verbal understanding of the material. Pt also instructed to wear a mask and social distance if he goes out. The opportunity to ask questions was provided.

## 2022-06-06 NOTE — Progress Notes (Signed)
Anesthesia Chart Review: Kathleene Hazel  Case: [id number] Date/Time: 06/07/22 0715   Procedure: LEFT ARM ARTERIOVENOUS (AV) FISTULA CREATION (Left)   Anesthesia type: Choice   Pre-op diagnosis: Chronic Renal Insufficiency Stage 4   Location: [facility] OR ROOM 16 / [place] OR   Surgeons: Serafina Mitchell, MD       DISCUSSION: Patient is a 61 year old male scheduled for the above procedure.  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  Anesthesia team to evaluate on the day of surgery. 04/15/22 EKG requested from [place]. He is on Prograf, prednisone 5 mg daily given renal transplant history.   VS: Ht '6\' 5"'$  (1.956 m)   Wt 131.5 kg   BMI 34.39 kg/m  BP Readings from Last 3 Encounters:  05/20/22 (!) 137/90  12/20/20 (!) 189/93  12/06/20 120/81   Pulse Readings from Last 3 Encounters:  05/20/22 63  12/20/20 (!) 57  12/06/20 71     PROVIDERS: Donato Heinz, MD is nephrologist. He is also starting new evaluation for possible 3rd renal transplant.  Charolette Child, MD is ENT ([facility]) Earmon Phoenix, MD is dermatologist ([facility])   LABS: For day of surgery. Cr on 03/25/22 was 4.96 and H/H 11.4/33.7, PLT 202.  A1c 5.5% on 11/28/21. ([facility]).    IMAGES: CT Neck/Thyroid 05/13/22 ([facility]): IMPRESSION: 1.  Due to the patient's renal function, IV contrast was not administered.  2.  No findings specific for nodal metastasis identified within the sensitivity of noncontrast CT.    CT Head 05/13/22 ([facility]): - Soft tissue thickening and irregularity of the right parietal scalp representing the site of biopsy-proven squamous cell carcinoma.  - Irregularity of the subjacent right parietal bone cortex without frank invasion or destruction. Consider brain MRI without and with gadolinium contrast for more confident assessment of possible right parietal calvarial involvement, if clinically warranted.   CXR 04/15/22 ([facility]): FINDINGS:  Cardiovascular: Cardiac silhouette and pulmonary vasculature are within normal limits.  Mediastinum: Within normal limits.  Lungs/pleura: Clear. No focal consolidation, pleural effusion, or pneumothorax.  Upper abdomen: Visualized portions are unremarkable.  Chest wall/osseous structures: Unremarkable.  IMPRESSION: There is no evidence of acute cardiac or pulmonary abnormality.   EKG: EKG 04/15/22 ([facility]): Tracing requested. Per narrative in CE: Sinus bradycardia at 58 bpm Otherwise normal ECG  When compared with ECG of 14-Nov-2015 21:50,  No significant change was found  Confirmed by fellow Claud Kelp [phone]) on 04/15/2022 8:55:53 AM  Confirmed by Sunny Schlein ([id number]) on 04/15/2022 10:15:50 AM    CV: Stress echo 02/21/15 ([facility]): SUMMARY  The patient had no chest pain during stress  The patient achieved 83 % of maximum predicted heart rate.  Normal left ventricular function at rest.The estimated LV ejection fraction  is 55-60% .  Normal left ventricular function and global wall motion with stress.  Negative dobutamine echocardiography for inducible ischemia at target heart  rate.  Negative stress ECG for inducible ischemia at target heart rate.   Echo 02/21/15 ([facility]): SUMMARY  The left ventricular size is normal.Left ventricular  systolic function is  normal.Left ventricular filling pattern is impaired.LV ejection fraction =  60-65%.There is mild concentric left ventricular hypertrophy.  The right ventricle is  mildly dilated. The right ventricular systolic  function is normal.  The left [facility] is mildly dilated.The right [facility] is mildly dilated.  There is aortic valve sclerosis.There is mild aortic regurgitation.  There is mild mitral regurgitation.  There is no pericardial effusion.    Past Medical History:  Diagnosis Date   Anemia    Chronic kidney disease    dialysis M<W<F-[facility]    Dialysis AV fistula infection ([place])    left forearm   Diarrhea    med   Diverticulosis    Failed kidney transplant    started dialysis 02-2013 after failure   Gout due to renal impairment    history (04/28/2012)   H/O kidney transplant    takes Cellcept and Prograf daily-off as transplant failed    History of blood transfusion    no abnormal reaction noted   History of bronchitis    unsure of how long ago   History of gout    but no meds required   Hyperlipidemia    borderline but no meds   Hypertension    but doesn't take any meds   Other back pain    Disc problems - age 14 y/o (1981)   Pneumonia    hx of;last time 2010   Shortness of breath    With exertion   Skin cancer    Tingling    fingers r/t meds he takes   Tubular adenoma of colon     Past Surgical History:  Procedure Laterality Date   APPENDECTOMY  01/2009   AV FISTULA PLACEMENT     Left   CAPD INSERTION N/A 08/03/2013   Procedure: LAPAROSCOPIC EXPLORATION OF THE ABDOMEN WITH PLACEMENT OF PERITONEAL DIALYSIS CATHETER;  Surgeon: Adin Hector, MD;  Location: [place];  Service: General;  Laterality: N/A;   CAPD INSERTION N/A 09/06/2013   Procedure: DIAGNOSTIC LAPAROSCOPY WITH  REPOSITIONING OF CONTINUOUS AMBULATORY PERITONEAL DIALYSIS  (CAPD) CATHETER ;  Surgeon: Adin Hector, MD;  Location: [place];  Service: General;  Laterality: N/A;   CAPD REMOVAL N/A 02/08/2014   Procedure: REMOVAL OF PERITONEAL DIALYSIS CATHETER;  Surgeon: Michael Boston, MD;  Location: [place] [facility];  Service: General;  Laterality: N/A;   HERNIA  REPAIR  01/2009   primary ventral hernia repair x2    INCISIONAL HERNIA REPAIR  04/27/2012   Procedure: LAPAROSCOPIC INCISIONAL HERNIA;  Surgeon: [name], MD;  Location: [place] [facility];  Service: General;  Laterality: N/A;   INSERTION OF MESH  04/27/2012   Procedure: INSERTION OF MESH;  Surgeon: [name], MD;  Location: [place] OR;  Service: General;  Laterality: N/A;   KIDNEY TRANSPLANT  04/2009   KIDNEY TRANSPLANT  2017    MEDICATIONS: No current facility-administered medications for this encounter.    calcitRIOL (ROCALTROL) 0.25 MCG capsule   labetalol (NORMODYNE) 200 MG tablet   MAGNESIUM OXIDE PO   Multiple Vitamins-Minerals (MULTIVITAMIN WITH MINERALS) tablet   predniSONE (DELTASONE) 5 MG tablet   sodium bicarbonate 650 MG tablet   sodium zirconium cyclosilicate (LOKELMA) 10 g PACK packet   tacrolimus (PROGRAF) 1 MG capsule    Myra Gianotti, PA-C Surgical Short Stay/Anesthesiology [facility] Phone [phone] [facility] Phone [phone] 06/06/2022 2:43 PM

## 2022-06-06 NOTE — Anesthesia Preprocedure Evaluation (Addendum)
Anesthesia Evaluation  Patient identified by MRN, date of birth, ID band Patient awake    Reviewed: Allergy & Precautions, NPO status , Patient's Chart, lab work & pertinent test results  History of Anesthesia Complications Negative for: history of anesthetic complications  Airway Mallampati: III  TM Distance: <3 FB Neck ROM: Full    Dental  (+) Dental Advisory Given, Missing   Pulmonary former smoker   Pulmonary exam normal        Cardiovascular hypertension, Pt. on home beta blockers Normal cardiovascular exam  Stress echo 02/21/15 ([facility]): SUMMARY  The patient had no chest pain during stress  The patient achieved 83 % of maximum predicted heart rate.  Normal left ventricular function at rest.The estimated LV ejection fraction  is 55-60% .  Normal left ventricular function and global wall motion with stress.  Negative dobutamine echocardiography for inducible ischemia at target heart  rate.  Negative stress ECG for inducible ischemia at target heart rate.    Echo 02/21/15 ([facility]): SUMMARY  The left ventricular size is normal.Left ventricular systolic function is  normal.Left ventricular filling pattern is impaired.LV ejection fraction =  60-65%.There is mild concentric left ventricular hypertrophy.  The right ventricle is mildly dilated. The right ventricular systolic  function is normal.  The left [facility] is mildly dilated.The right [facility] is mildly dilated.  There is aortic valve sclerosis.There is mild aortic regurgitation.  There is mild mitral regurgitation.  There is no pericardial effusion.       Neuro/Psych negative neurological ROS     GI/Hepatic negative GI ROS, Neg liver ROS,,,  Endo/Other  negative endocrine ROS    Renal/GU CRFRenal disease     Musculoskeletal negative musculoskeletal ROS (+)    Abdominal   Peds  Hematology negative hematology ROS (+)   Anesthesia Other Findings    Reproductive/Obstetrics                             Anesthesia Physical Anesthesia Plan  ASA: 3  Anesthesia Plan: MAC and Regional   Post-op Pain Management: Tylenol PO (pre-op)* and Regional block*   Induction:   PONV Risk Score and Plan: 1  Airway Management Planned: Natural Airway and Simple Face Mask  Additional Equipment:   Intra-op Plan:   Post-operative Plan:   Informed Consent: I have reviewed the patients History and Physical, chart, labs and discussed the procedure including the risks, benefits and alternatives for the proposed anesthesia with the patient or authorized representative who has indicated his/her understanding and acceptance.     Dental advisory given  Plan Discussed with: Anesthesiologist  Anesthesia Plan Comments: (PAT note written 06/06/2022 by Myra Gianotti, PA-C.  )       Anesthesia Quick Evaluation

## 2022-06-07 ENCOUNTER — Encounter (HOSPITAL_COMMUNITY)
Admission: RE | Disposition: A | Discharge status: Home / Self Care | Payer: Self-pay | Source: Ambulatory Visit | Attending: Surgery

## 2022-06-07 ENCOUNTER — Ambulatory Visit (HOSPITAL_COMMUNITY): Payer: Managed Care, Other (non HMO) | Admitting: Vascular Surgery

## 2022-06-07 ENCOUNTER — Other Ambulatory Visit: Payer: Self-pay

## 2022-06-07 ENCOUNTER — Ambulatory Visit (HOSPITAL_COMMUNITY)
Admission: RE | Admit: 2022-06-07 | Discharge: 2022-06-07 | Disposition: A | Discharge status: Home / Self Care | Payer: Managed Care, Other (non HMO) | Source: Ambulatory Visit | Attending: Surgery | Admitting: Surgery

## 2022-06-07 DIAGNOSIS — N186 End stage renal disease: Secondary | ICD-10-CM | POA: Diagnosis present

## 2022-06-07 DIAGNOSIS — T8611 Kidney transplant rejection: Secondary | ICD-10-CM | POA: Diagnosis not present

## 2022-06-07 DIAGNOSIS — I129 Hypertensive chronic kidney disease with stage 1 through stage 4 chronic kidney disease, or unspecified chronic kidney disease: Secondary | ICD-10-CM | POA: Diagnosis not present

## 2022-06-07 DIAGNOSIS — I120 Hypertensive chronic kidney disease with stage 5 chronic kidney disease or end stage renal disease: Secondary | ICD-10-CM | POA: Insufficient documentation

## 2022-06-07 DIAGNOSIS — Z87891 Personal history of nicotine dependence: Secondary | ICD-10-CM | POA: Insufficient documentation

## 2022-06-07 DIAGNOSIS — N185 Chronic kidney disease, stage 5: Secondary | ICD-10-CM | POA: Diagnosis not present

## 2022-06-07 DIAGNOSIS — Z940 Kidney transplant status: Secondary | ICD-10-CM | POA: Diagnosis not present

## 2022-06-07 DIAGNOSIS — N179 Acute kidney failure, unspecified: Secondary | ICD-10-CM | POA: Insufficient documentation

## 2022-06-07 DIAGNOSIS — Z992 Dependence on renal dialysis: Secondary | ICD-10-CM | POA: Insufficient documentation

## 2022-06-07 DIAGNOSIS — N184 Chronic kidney disease, stage 4 (severe): Secondary | ICD-10-CM | POA: Diagnosis not present

## 2022-06-07 HISTORY — PX: AV FISTULA PLACEMENT: SHX1204

## 2022-06-07 LAB — POCT I-STAT, CHEM 8
BUN: 89 mg/dL — ABNORMAL HIGH (ref 6–20)
Calcium, Ion: 1.31 mmol/L (ref 1.15–1.40)
Chloride: 112 mmol/L — ABNORMAL HIGH (ref 98–111)
Creatinine, Ser: 6.8 mg/dL — ABNORMAL HIGH (ref 0.61–1.24)
Glucose, Bld: 105 mg/dL — ABNORMAL HIGH (ref 70–99)
HCT: 28 % — ABNORMAL LOW (ref 39.0–52.0)
Hemoglobin: 9.5 g/dL — ABNORMAL LOW (ref 13.0–17.0)
Potassium: 4.8 mmol/L (ref 3.5–5.1)
Sodium: 143 mmol/L (ref 135–145)
TCO2: 22 mmol/L (ref 22–32)

## 2022-06-07 SURGERY — ARTERIOVENOUS (AV) FISTULA CREATION
Anesthesia: Monitor Anesthesia Care | Site: Arm Upper | Laterality: Left

## 2022-06-07 MED ORDER — 0.9 % SODIUM CHLORIDE (POUR BTL) OPTIME
TOPICAL | Status: DC | PRN
  Administered 2022-06-07: 1000 mL

## 2022-06-07 MED ORDER — DEXAMETHASONE SODIUM PHOSPHATE 10 MG/ML IJ SOLN
INTRAMUSCULAR | Status: AC
  Filled 2022-06-07: qty 1

## 2022-06-07 MED ORDER — ONDANSETRON HCL 4 MG/2ML IJ SOLN
INTRAMUSCULAR | Status: DC | PRN
  Administered 2022-06-07: 4 mg via INTRAVENOUS

## 2022-06-07 MED ORDER — CHLORHEXIDINE GLUCONATE 4 % EX LIQD: 60.0000 mL | Freq: Once | CUTANEOUS | Status: DC

## 2022-06-07 MED ORDER — LIDOCAINE 2% (20 MG/ML) 5 ML SYRINGE
INTRAMUSCULAR | Status: AC
  Filled 2022-06-07: qty 5

## 2022-06-07 MED ORDER — CHLORHEXIDINE GLUCONATE 0.12 % MT SOLN: 15.0000 mL | Freq: Once | OROMUCOSAL | Status: AC

## 2022-06-07 MED ORDER — MIDAZOLAM HCL 2 MG/2ML IJ SOLN
INTRAMUSCULAR | Status: AC
  Filled 2022-06-07: qty 2

## 2022-06-07 MED ORDER — ORAL CARE MOUTH RINSE: 15.0000 mL | Freq: Once | OROMUCOSAL | Status: AC

## 2022-06-07 MED ORDER — CEFAZOLIN IN SODIUM CHLORIDE 3-0.9 GM/100ML-% IV SOLN
3.0000 g | INTRAVENOUS | Status: AC
  Administered 2022-06-07: 3 g via INTRAVENOUS
  Filled 2022-06-07: qty 100

## 2022-06-07 MED ORDER — FENTANYL CITRATE (PF) 250 MCG/5ML IJ SOLN
INTRAMUSCULAR | Status: AC
  Filled 2022-06-07: qty 5

## 2022-06-07 MED ORDER — ONDANSETRON HCL 4 MG/2ML IJ SOLN
INTRAMUSCULAR | Status: AC
  Filled 2022-06-07: qty 2

## 2022-06-07 MED ORDER — HEPARIN 6000 UNIT IRRIGATION SOLUTION
Status: AC
  Filled 2022-06-07: qty 500

## 2022-06-07 MED ORDER — PROPOFOL 500 MG/50ML IV EMUL
INTRAVENOUS | Status: DC | PRN
  Administered 2022-06-07: 100 ug/kg/min via INTRAVENOUS

## 2022-06-07 MED ORDER — BUPIVACAINE-EPINEPHRINE (PF) 0.5% -1:200000 IJ SOLN
INTRAMUSCULAR | Status: DC | PRN
  Administered 2022-06-07: 30 mL via PERINEURAL

## 2022-06-07 MED ORDER — CHLORHEXIDINE GLUCONATE 0.12 % MT SOLN
OROMUCOSAL | Status: AC
  Administered 2022-06-07: 15 mL via OROMUCOSAL
  Filled 2022-06-07: qty 15

## 2022-06-07 MED ORDER — SODIUM CHLORIDE 0.9 % IV SOLN: INTRAVENOUS | Status: DC

## 2022-06-07 MED ORDER — LIDOCAINE 2% (20 MG/ML) 5 ML SYRINGE
INTRAMUSCULAR | Status: DC | PRN
  Administered 2022-06-07: 40 mg via INTRAVENOUS

## 2022-06-07 MED ORDER — MIDAZOLAM HCL 2 MG/2ML IJ SOLN
INTRAMUSCULAR | Status: DC | PRN
  Administered 2022-06-07 (×2): 1 mg via INTRAVENOUS

## 2022-06-07 MED ORDER — STERILE WATER FOR IRRIGATION IR SOLN
Status: DC | PRN
  Administered 2022-06-07: 1000 mL

## 2022-06-07 MED ORDER — ACETAMINOPHEN 500 MG PO TABS
1000.0000 mg | ORAL_TABLET | Freq: Once | ORAL | Status: AC
  Administered 2022-06-07: 1000 mg via ORAL
  Filled 2022-06-07: qty 2

## 2022-06-07 MED ORDER — OXYCODONE-ACETAMINOPHEN 5-325 MG PO TABS: 1.0000 | ORAL_TABLET | Freq: Four times a day (QID) | ORAL | 0 refills | Status: DC | PRN

## 2022-06-07 MED ORDER — HEPARIN 6000 UNIT IRRIGATION SOLUTION
Status: DC | PRN
  Administered 2022-06-07: 1

## 2022-06-07 MED ORDER — LIDOCAINE-EPINEPHRINE (PF) 1 %-1:200000 IJ SOLN
INTRAMUSCULAR | Status: AC
  Filled 2022-06-07: qty 30

## 2022-06-07 MED ORDER — DEXAMETHASONE SODIUM PHOSPHATE 10 MG/ML IJ SOLN
INTRAMUSCULAR | Status: DC | PRN
  Administered 2022-06-07 (×2): 5 mg

## 2022-06-07 MED ORDER — FENTANYL CITRATE (PF) 250 MCG/5ML IJ SOLN
INTRAMUSCULAR | Status: DC | PRN
  Administered 2022-06-07: 50 ug via INTRAVENOUS

## 2022-06-07 MED ORDER — PROPOFOL 10 MG/ML IV BOLUS
INTRAVENOUS | Status: DC | PRN
  Administered 2022-06-07: 30 mg via INTRAVENOUS

## 2022-06-07 MED ORDER — LACTATED RINGERS IV SOLN: INTRAVENOUS | Status: DC

## 2022-06-07 SURGICAL SUPPLY — 35 items
ARMBAND PINK RESTRICT EXTREMIT (MISCELLANEOUS) ×2 IMPLANT
BAG COUNTER SPONGE SURGICOUNT (BAG) ×1 IMPLANT
CANISTER SUCT 3000ML PPV (MISCELLANEOUS) ×1 IMPLANT
CLIP TI MEDIUM 6 (CLIP) IMPLANT
CLIP TI WIDE RED SMALL 6 (CLIP) IMPLANT
CLIP VESOCCLUDE MED 6/CT (CLIP) ×1 IMPLANT
CLIP VESOCCLUDE SM WIDE 6/CT (CLIP) ×1 IMPLANT
COVER PROBE W GEL 5X96 (DRAPES) ×1 IMPLANT
DERMABOND ADVANCED .7 DNX12 (GAUZE/BANDAGES/DRESSINGS) ×1 IMPLANT
ELECT REM PT RETURN 9FT ADLT (ELECTROSURGICAL) ×1
ELECTRODE REM PT RTRN 9FT ADLT (ELECTROSURGICAL) ×1 IMPLANT
GLOVE SURG SS PI 7.5 STRL IVOR (GLOVE) ×3 IMPLANT
GOWN STRL REUS W/ TWL LRG LVL3 (GOWN DISPOSABLE) ×2 IMPLANT
GOWN STRL REUS W/ TWL XL LVL3 (GOWN DISPOSABLE) ×1 IMPLANT
GOWN STRL REUS W/TWL LRG LVL3 (GOWN DISPOSABLE) ×2
GOWN STRL REUS W/TWL XL LVL3 (GOWN DISPOSABLE) ×1
HEMOSTAT SNOW SURGICEL 2X4 (HEMOSTASIS) IMPLANT
KIT BASIN OR (CUSTOM PROCEDURE TRAY) ×1 IMPLANT
KIT TURNOVER KIT B (KITS) ×1 IMPLANT
LOOP VESSEL MINI RED (MISCELLANEOUS) IMPLANT
NS IRRIG 1000ML POUR BTL (IV SOLUTION) ×1 IMPLANT
PACK CV ACCESS (CUSTOM PROCEDURE TRAY) ×1 IMPLANT
PAD ARMBOARD 7.5X6 YLW CONV (MISCELLANEOUS) ×2 IMPLANT
SHEATH PROBE COVER 6X72 (BAG) IMPLANT
SLING ARM FOAM STRAP LRG (SOFTGOODS) IMPLANT
SLING ARM FOAM STRAP MED (SOFTGOODS) IMPLANT
SPONGE T-LAP 18X18 ~~LOC~~+RFID (SPONGE) IMPLANT
SUT PROLENE 6 0 BV (SUTURE) IMPLANT
SUT PROLENE 6 0 CC (SUTURE) ×1 IMPLANT
SUT VIC AB 3-0 SH 27 (SUTURE) ×1
SUT VIC AB 3-0 SH 27X BRD (SUTURE) ×1 IMPLANT
SUT VICRYL 4-0 PS2 18IN ABS (SUTURE) ×1 IMPLANT
TOWEL GREEN STERILE (TOWEL DISPOSABLE) ×1 IMPLANT
UNDERPAD 30X36 HEAVY ABSORB (UNDERPADS AND DIAPERS) ×1 IMPLANT
WATER STERILE IRR 1000ML POUR (IV SOLUTION) ×1 IMPLANT

## 2022-06-07 NOTE — Anesthesia Postprocedure Evaluation (Signed)
Anesthesia Post Note  Patient: Mario Woods.  Procedure(s) Performed: LEFT ARM ARTERIOVENOUS (AV) FISTULA CREATION (Left: Arm Upper)     Patient location during evaluation: PACU Anesthesia Type: Regional and MAC Level of consciousness: awake and alert Pain management: pain level controlled Vital Signs Assessment: post-procedure vital signs reviewed and stable Respiratory status: spontaneous breathing and respiratory function stable Cardiovascular status: stable Postop Assessment: no apparent nausea or vomiting Anesthetic complications: no   No notable events documented.  Last Vitals:  Vitals:   06/07/22 0935 06/07/22 0950  BP: 133/77 (!) 140/83  Pulse: 62 64  Resp: 18 20  Temp:  36.4 C  SpO2: 94% 95%    Last Pain:  Vitals:   06/07/22 0950  TempSrc:   PainSc: 0-No pain                 Praveen Coia DANIEL

## 2022-06-07 NOTE — Interval H&P Note (Signed)
History and Physical Interval Note:  06/07/2022 7:35 AM  Mario Woods.  has presented today for surgery, with the diagnosis of Chronic Renal Insufficiency Stage 4.  The various methods of treatment have been discussed with the patient and family. After consideration of risks, benefits and other options for treatment, the patient has consented to  Procedure(s): LEFT ARM ARTERIOVENOUS (AV) FISTULA CREATION (Left) as a surgical intervention.  The patient's history has been reviewed, patient examined, no change in status, stable for surgery.  I have reviewed the patient's chart and labs.  Questions were answered to the patient's satisfaction.     Annamarie Major

## 2022-06-07 NOTE — Discharge Instructions (Signed)
[facility]  Discharge Instructions  AV Fistula or Graft Surgery for Dialysis Access  Please refer to the following instructions for your post-procedure care. Your surgeon or physician assistant will discuss any changes with you.  Activity  You may drive the day following your surgery, if you are comfortable and no longer taking prescription pain medication. Resume full activity as the soreness in your incision resolves.  Bathing/Showering  You may shower after you go home. Keep your incision dry for 48 hours. Do not soak in a bathtub, hot tub, or swim until the incision heals completely. You may not shower if you have a hemodialysis catheter.  Incision Care  Clean your incision with mild soap and water after 48 hours. Pat the area dry with a clean towel. You do not need a bandage unless otherwise instructed. Do not apply any ointments or creams to your incision. You may have skin glue on your incision. Do not peel it off. It will come off on its own in about one week. Your arm may swell a bit after surgery. To reduce swelling use pillows to elevate your arm so it is above your heart. Your doctor will tell you if you need to lightly wrap your arm with an ACE bandage.  Diet  Resume your normal diet. There are not special food restrictions following this procedure. In order to heal from your surgery, it is CRITICAL to get adequate nutrition. Your body requires vitamins, minerals, and protein. Vegetables are the best source of vitamins and minerals. Vegetables also provide the perfect balance of protein. Processed food has little nutritional value, so try to avoid this.  Medications  Resume taking all of your medications. If your incision is causing pain, you may take over-the counter pain relievers such as acetaminophen (Tylenol). If you were prescribed a stronger pain medication, please be aware these medications can cause nausea and constipation. Prevent  nausea by taking the medication with a snack or meal. Avoid constipation by drinking plenty of fluids and eating foods with high amount of fiber, such as fruits, vegetables, and grains.  Do not take Tylenol if you are taking prescription pain medications.  Follow up Your surgeon may want to see you in the office following your access surgery. If so, this will be arranged at the time of your surgery.  Please call us immediately for any of the following conditions:  Increased pain, redness, drainage (pus) from your incision site Fever of 101 degrees or higher Severe or worsening pain at your incision site Hand pain or numbness.  Reduce your risk of vascular disease:  Stop smoking. If you would like help, call [facility] at 1-800-QUIT-NOW 412-132-2594) or [place] at [place] your cholesterol Maintain a desired weight Control your diabetes Keep your blood pressure down  Dialysis  It will take several weeks to several months for your new dialysis access to be ready for use. Your surgeon will determine when it is okay to use it. Your nephrologist will continue to direct your dialysis. You can continue to use your Permcath until your new access is ready for use.   06/07/2022 Mario Woods [id number] 1961-01-14  Surgeon(s): Serafina Mitchell, MD  Procedure(s): LEFT ARM ARTERIOVENOUS (AV) FISTULA CREATION   May stick graft immediately   May stick graft on designated area only:   X Do not stick left AV fistula for 12 weeks    If you have any questions, please call the  office at [phone].

## 2022-06-07 NOTE — Anesthesia Procedure Notes (Signed)
Procedure Name: MAC Date/Time: 06/07/2022 7:45 AM  Performed by: Trinna Post., CRNAPre-anesthesia Checklist: Patient identified, Emergency Drugs available, Suction available, Patient being monitored and Timeout performed Patient Re-evaluated:Patient Re-evaluated prior to induction Oxygen Delivery Method: Simple face mask Preoxygenation: Pre-oxygenation with 100% oxygen Induction Type: IV induction Placement Confirmation: positive ETCO2

## 2022-06-07 NOTE — Anesthesia Procedure Notes (Signed)
Anesthesia Regional Block: Supraclavicular block   Pre-Anesthetic Checklist: , timeout performed,  Correct Patient, Correct Site, Correct Laterality,  Correct Procedure, Correct Position, site marked,  Risks and benefits discussed,  Surgical consent,  Pre-op evaluation,  At surgeon's request and post-op pain management  Laterality: Left  Prep: chloraprep       Needles:  Injection technique: Single-shot  Needle Type: Echogenic Stimulator Needle     Needle Length: 5cm  Needle Gauge: 22     Additional Needles:   Narrative:  Start time: 06/07/2022 7:06 AM End time: 06/07/2022 7:16 AM Injection made incrementally with aspirations every 5 mL.  Performed by: Personally  Anesthesiologist: Duane Boston, MD  Additional Notes: Functioning IV was confirmed and monitors applied.  A 28m 22ga echogenic arrow stimulator was used. Sterile prep and drape,hand hygiene and sterile gloves were used.Ultrasound guidance: relevant anatomy identified, needle position confirmed, local anesthetic spread visualized around nerve(s)., vascular puncture avoided.  Image printed for medical record.  Negative aspiration and negative test dose prior to incremental administration of local anesthetic. The patient tolerated the procedure well.

## 2022-06-07 NOTE — Transfer of Care (Signed)
Immediate Anesthesia Transfer of Care Note  Patient: Mario Woods.  Procedure(s) Performed: LEFT ARM ARTERIOVENOUS (AV) FISTULA CREATION (Left: Arm Upper)  Patient Location: PACU  Anesthesia Type:MAC and Regional  Level of Consciousness: awake, alert , and oriented  Airway & Oxygen Therapy: Patient Spontanous Breathing  Post-op Assessment: Report given to RN and Post -op Vital signs reviewed and stable  Post vital signs: Reviewed and stable  Last Vitals:  Vitals Value Taken Time  BP 133/78 06/07/22 0921  Temp    Pulse 66 06/07/22 0922  Resp 20 06/07/22 0922  SpO2 94 % 06/07/22 0922  Vitals shown include unvalidated device data.  Last Pain:  Vitals:   06/07/22 0613  TempSrc:   PainSc: 0-No pain         Complications: No notable events documented.

## 2022-06-07 NOTE — Op Note (Signed)
    Patient name: Mario Woods. MRN: [id number] DOB: 21-Sep-1960 Sex: male  06/07/2022 Pre-operative Diagnosis: CKD Post-operative diagnosis:  Same Surgeon:  Annamarie Major Assistants:  Ivin Booty, PA Procedure:   Left Brachio-cephalic fistula Anesthesia:  Regional Blood Loss:  minimal Specimens:  none  Findings:  15m artery, 6 mm vein  Indications: This is a 61year old gentleman with chronic renal sufficiency.  He has a occluded left radiocephalic fistula but has had a kidney transplant which is failing and so he needs new access.  Procedure:  The patient was identified in the holding area and taken to [id number]  The patient was then placed supine on the table. regional anesthesia was administered.  The patient was prepped and draped in the usual sterile fashion.  A time out was called and antibiotics were administered.  A PA was necessary to expedite the procedure and assist with technical details.  She help with exposure by providing suction and retraction.  She also help by following the suture for the anastomosis and wound closure.  Ultrasound was used to evaluate the cephalic vein in the upper arm.  This was of excellent caliber.  A transverse incision was made just proximal to the antecubital crease.  I first dissected out the brachial artery.  This was a disease-free 6 mm artery.  I then dissected out the cephalic vein.  This is a healthy 5-6 mm vein.  Multiple branches were ligated between silk ties.  The vein was then marked for orientation and ligated distally.  It flushed easily with heparin saline.  The brachial artery was then occluded with vascular clamps and a #11 blade was used to make an arteriotomy which was extended longitudinally with Potts scissors.  The vein was cut the appropriate length and spatulated to fit the size the arteriotomy.  A running anastomosis was created with 6-0 Prolene.  Prior to completion, the appropriate flushing maneuvers were performed and the  anastomosis was completed.  There was an excellent thrill within the fistula and a palpable radial pulse.  I inspected the course of the vein to make sure there were no kinks.  The wound was irrigated.  Hemostasis was achieved.  The incision was closed with 2 layers of Vicryl followed by Dermabond.  There were no immediate complications.   Disposition: To PACU stable.   VTheotis Burrow M.D., FSand Lake Surgicenter LLCVascular and Vein Specialists of GDanvilleOffice: [phone]:  [phone]

## 2022-06-08 ENCOUNTER — Encounter (HOSPITAL_COMMUNITY): Payer: Self-pay | Admitting: Surgery

## 2022-06-13 ENCOUNTER — Telehealth: Payer: Self-pay | Admitting: Physician Assistant

## 2022-06-13 NOTE — Telephone Encounter (Signed)
-----   Message from Karoline Caldwell, [place] sent at 06/07/2022  9:20 AM EST ----- S/p left AV fistula creation by Dr. Trula Slade. He needs follow up in 4-6 weeks with fistula duplex

## 2022-07-20 ENCOUNTER — Encounter: Payer: Self-pay | Admitting: Physician Assistant

## 2022-08-08 ENCOUNTER — Ambulatory Visit
Admission: RE | Admit: 2022-08-08 | Discharge: 2022-08-08 | Disposition: A | Discharge status: Home / Self Care | Payer: Self-pay | Source: Ambulatory Visit | Attending: Radiation Oncology | Admitting: Radiation Oncology

## 2022-08-08 ENCOUNTER — Other Ambulatory Visit: Payer: Self-pay | Admitting: Radiation Oncology

## 2022-08-08 ENCOUNTER — Telehealth: Payer: Self-pay | Admitting: Radiation Oncology

## 2022-08-08 DIAGNOSIS — C760 Malignant neoplasm of head, face and neck: Secondary | ICD-10-CM

## 2022-08-08 DIAGNOSIS — C4442 Squamous cell carcinoma of skin of scalp and neck: Secondary | ICD-10-CM

## 2022-08-08 NOTE — Telephone Encounter (Signed)
2/29 @ 11:56 am Left voicemail for patient to call our office to be schedule for consult with Dr. Isidore Moos.

## 2022-08-09 ENCOUNTER — Telehealth: Payer: Self-pay | Admitting: Radiation Oncology

## 2022-08-09 NOTE — Telephone Encounter (Signed)
3/1 @ 8:44 am Left voicemail on patient and patient's spouse contact # for patient to call our office to be schedule for an consult with Dr. Isidore Moos.

## 2022-08-13 NOTE — Progress Notes (Incomplete)
Radiation Oncology         [phone]) [phone] ________________________________  Initial Outpatient Consultation  Name: Mario Woods. MRN: [id number]  Date: 08/14/2022  DOB: [birth year]  [id number], Broadus John, MD  Francina Ames, MD   REFERRING PHYSICIAN: Francina Ames, MD  DIAGNOSIS:    ICD-10-CM   1. SCC (squamous cell carcinoma), scalp/neck  C44.42      Invasive squamous cell carcinoma if the scalp with extension into the subcutaneous tissue and underlying fascia, positive for LVI: s/p excision   CHIEF COMPLAINT: Here to discuss management of skin cancer  HISTORY OF PRESENT ILLNESS::Mario Woods. is a 62 y.o. 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 The patient has a history significant for multiple BCC's and SCC's. These include a site SCC to the left temple and left preauricular region, s/p Mohs in November 2023, and [facility] of the left proximal forearm and left distal forearm s/p excision in December 2023. He also has a history of two renal transplants in 2010 and 2017. He is subsequently immunosuppressed and is on prograf and prednisone.   The scalp lesion of concern was first noted in 2021 and was treated with Mohs surgery. Clear margins were obtained, however he had poor healing and tenderness post-op. He developed a lesion in this same area a year later concerning for recurrent vertex scalp SCC.   Subsequently, the patient met with Dr. Renato Gails at [facility] for further management. Biopsy of the scalp lesion collected on 04/24/2022 by Dr. Renato Gails confirmed invasive SCC. (The patient also underwent biopsies of 2 left forearm lesions on 05/15/22 which both showed invasive SCC).   CT of the head without contrast on 05/13/22 showed soft tissue thickening and irregularity of the right parietal scalp representing the site of biopsy-proven squamous cell carcinoma. CT also showed an  irregularity of the subjacent right parietal bone cortex, without evidence of frank invasion or destruction. Thyroid/neck CT also performed on 05/13/22 showed no evidence of nodal metastatic disease within the neck (however this study was performed without contrast in the setting of the patient's impaired renal function).   MRI of the brain with and without performed on 06/06/22 demonstrated a homogeneously enhancing right parietal scalp mass measures at least 6.2 x 3.2 x 1.3 cm compatible with the patient's history of squamous cell carcinoma. Although concern for possible cortical involvement was noted (due to portions of the outer cortex appearing indistinct), no frank marrow invasion was identified.   PET scan on 06/11/22 showed the right parietal scalp mass as FDG avid (SUV max of 6.0), measuring up to 5.4 cm. As noted on the prior MRI, the mass appeared to abut the underlying periosteum, however no definite FDG uptake within the calvarium was appreciated. PET also showed a RUL pulmonary nodule measuring 6 mm that was below PET resolution. PET otherwise showed no definite evidence of distant metastatic disease.   Subsequently, the patient was referred to Dr. Nicolette Bang at [facility] for further management. The patient made an informed decision to proceed with excision of the right scalp mass, along with parotidectomy w/ facial nerve dissection, left latissimus free flap (muscle only), and a split thickness skin graft (left flank) under the care of Dr. Nicolette Bang on 07/18/22. Pathology from the right scalp excision revealed: tumor the size of 7.0 cm; histology of grade 1 well differentiated invasive squamous cell carcinoma with extension into the subcutaneous tissue and the  underlying fascia; all margins negative for carcinoma; positive for LVI with invasion depth of 1.3 cm; nodal status of 11/11 level 2A-2B right neck lymph nodes negative for carcinoma. Path from right superficial parotidectomy showed no  evidence of carcinoma in 1/1 lymph node.   The patient was admitted to the SICU for post-operative monitoring. Overnight on 2/8, he had 525cc drain output and was taken back to the OR for wound exploration. He was later transfused 2 units of pRBC on 2/10 for acute blood loss anemia. His hemoglobin eventually stabilized, however he remained anemic. He was given 1 additional unit of pRBC the day prior to discharge.   Swallowing issues, if any: none  Weight Changes: none  Pain status: ***  Other symptoms: ***  Tobacco history, if any: Quit smoking about 14 years ago. His smoking use included cigarettes. He has a 5.00 pack-year smoking history in total.  ETOH abuse, if any: does not currently drink  Prior cancers, if any: extensive skin cancer history as noted above (SCC's and BCC's). (Immunosuppressed due to renal transplants).   PREVIOUS RADIATION THERAPY: No  PAST MEDICAL HISTORY:  has a past medical history of Anemia, Chronic kidney disease, Dialysis AV fistula infection ([place]), Diarrhea, Diverticulosis, Failed kidney transplant, Gout due to renal impairment, H/O kidney transplant, History of blood transfusion, History of bronchitis, History of gout, Hyperlipidemia, Hypertension, Other back pain, Pneumonia, Shortness of breath, Skin cancer, Tingling, and Tubular adenoma of colon.    PAST SURGICAL HISTORY: Past Surgical History:  Procedure Laterality Date   APPENDECTOMY  01/2009   AV FISTULA PLACEMENT     Left   AV FISTULA PLACEMENT Left 06/07/2022   Procedure: LEFT ARM ARTERIOVENOUS (AV) FISTULA CREATION;  Surgeon: Serafina Mitchell, MD;  Location: [facility];  Service: Vascular;  Laterality: Left;   CAPD INSERTION N/A 08/03/2013   Procedure: LAPAROSCOPIC EXPLORATION OF THE ABDOMEN WITH PLACEMENT OF PERITONEAL DIALYSIS CATHETER;  Surgeon: Adin Hector, MD;  Location: [place];  Service: General;  Laterality: N/A;   CAPD INSERTION N/A 09/06/2013   Procedure: DIAGNOSTIC LAPAROSCOPY WITH   REPOSITIONING OF CONTINUOUS AMBULATORY PERITONEAL DIALYSIS  (CAPD) CATHETER ;  Surgeon: Adin Hector, MD;  Location: [place];  Service: General;  Laterality: N/A;   CAPD REMOVAL N/A 02/08/2014   Procedure: REMOVAL OF PERITONEAL DIALYSIS CATHETER;  Surgeon: Michael Boston, MD;  Location: [facility];  Service: General;  Laterality: N/A;   HERNIA REPAIR  01/2009   primary ventral hernia repair x2    INCISIONAL HERNIA REPAIR  04/27/2012   Procedure: LAPAROSCOPIC INCISIONAL HERNIA;  Surgeon: [name], MD;  Location: [place];  Service: General;  Laterality: N/A;   INSERTION OF MESH  04/27/2012   Procedure: INSERTION OF MESH;  Surgeon: [name], MD;  Location: [place];  Service: General;  Laterality: N/A;   KIDNEY TRANSPLANT  04/2009   KIDNEY TRANSPLANT  2017    FAMILY HISTORY: family history includes Pancreatic cancer in his maternal grandmother; Sjogren's syndrome in his mother.  SOCIAL HISTORY:  reports that he has quit smoking. His smoking use included cigarettes. He has a 5.00 pack-year smoking history. He has never used smokeless tobacco. He reports that he does not currently use alcohol. He reports that he does not use drugs.  ALLERGIES: Patient has no known allergies.  MEDICATIONS:  Current Outpatient Medications  Medication Sig Dispense Refill   calcitRIOL (ROCALTROL) 0.25 MCG capsule Take 0.25 mcg by mouth every Monday, Wednesday, and Friday.     labetalol (  NORMODYNE) 200 MG tablet Take 400 mg by mouth 3 (three) times daily.     MAGNESIUM OXIDE PO Take 500 mg by mouth daily.     Multiple Vitamins-Minerals (MULTIVITAMIN WITH MINERALS) tablet Take 1 tablet by mouth daily.     oxyCODONE-acetaminophen (PERCOCET) 5-325 MG tablet Take 1 tablet by mouth every 6 (six) hours as needed for severe pain. 10 tablet 0   predniSONE (DELTASONE) 5 MG tablet Take 5 mg by mouth daily.     sodium bicarbonate 650 MG tablet Take 1,950 mg by mouth 3 (three) times daily.     sodium zirconium  cyclosilicate (LOKELMA) 10 g PACK packet Take 10 g by mouth every other day.     tacrolimus (PROGRAF) 1 MG capsule Take 2 mg by mouth 2 (two) times daily.     No current facility-administered medications for this encounter.    REVIEW OF SYSTEMS:  Notable for that above.   PHYSICAL EXAM:  vitals were not taken for this visit.   General: Alert and oriented, in no acute distress HEENT: Head is normocephalic. Extraocular movements are intact. Oropharynx is notable for ***. Neck: Neck is notable for *** Heart: Regular in rate and rhythm with no murmurs, rubs, or gallops. Chest: Clear to auscultation bilaterally, with no rhonchi, wheezes, or rales. Abdomen: Soft, nontender, nondistended, with no rigidity or guarding. Extremities: No cyanosis or edema. Lymphatics: see Neck Exam Skin: No concerning lesions. Musculoskeletal: symmetric strength and muscle tone throughout. Neurologic: Cranial nerves II through XII are grossly intact. No obvious focalities. Speech is fluent. Coordination is intact. Psychiatric: Judgment and insight are intact. Affect is appropriate.   ECOG = ***  0 - Asymptomatic (Fully active, able to carry on all predisease activities without restriction)  1 - Symptomatic but completely ambulatory (Restricted in physically strenuous activity but ambulatory and able to carry out work of a light or sedentary nature. For example, light housework, office work)  2 - Symptomatic, <50% in bed during the day (Ambulatory and capable of all self care but unable to carry out any work activities. Up and about more than 50% of waking hours)  3 - Symptomatic, >50% in bed, but not bedbound (Capable of only limited self-care, confined to bed or chair 50% or more of waking hours)  4 - Bedbound (Completely disabled. Cannot carry on any self-care. Totally confined to bed or chair)  5 - Death   Eustace Pen MM, Creech RH, Tormey DC, et al. [phone]). "Toxicity and response criteria of the [facility]". [place] Oncol. 5 (6): 649-55   LABORATORY DATA:  Lab Results  Component Value Date   WBC 6.3 10/17/2015   HGB 9.5 (L) 06/07/2022   HCT 28.0 (L) 06/07/2022   MCV 104.9 (H) 10/17/2015   PLT 218 10/17/2015   CMP     Component Value Date/Time   NA 143 06/07/2022 0600   K 4.8 06/07/2022 0600   CL 112 (H) 06/07/2022 0600   CO2 23 10/17/2015 0517   GLUCOSE 105 (H) 06/07/2022 0600   BUN 89 (H) 06/07/2022 0600   CREATININE 6.80 (H) 06/07/2022 0600   CREATININE 11.86 (HH) 02/15/2013 1124   CALCIUM 9.7 10/17/2015 0517   CALCIUM 8.9 08/10/2008 0955   PROT 6.7 10/15/2015 1151   PROT 7.1 11/15/2014 1554   ALBUMIN 3.6 10/17/2015 0517   AST 16 10/15/2015 1151   ALT 22 10/15/2015 1151   ALKPHOS 108 10/15/2015 1151   BILITOT 0.8 10/15/2015 1151  GFRNONAA 5 (L) 10/17/2015 0517   GFRAA 6 (L) 10/17/2015 0517      Lab Results  Component Value Date   TSH 1.28 11/09/2020     RADIOGRAPHY: No results found.    IMPRESSION/PLAN:  This is a delightful patient with head and neck cancer. I *** recommend radiotherapy for this patient.  We discussed the potential risks, benefits, and side effects of radiotherapy. We talked in detail about acute and late effects. We discussed that some of the most bothersome acute effects may be mucositis, dysgeusia, salivary changes, skin irritation, hair loss, dehydration, weight loss and fatigue. We talked about late effects which include but are not necessarily limited to dysphagia, hypothyroidism, nerve injury, vascular injury, spinal cord injury, xerostomia, trismus, neck edema, and potential injury to any of the tissues in the head and neck region. No guarantees of treatment were given. A consent form was signed and placed in the patient's medical record. The patient is enthusiastic about proceeding with treatment. I look forward to participating in the patient's care.    Simulation (treatment planning) will take place  ***  We also discussed that the treatment of head and neck cancer is a multidisciplinary process to maximize treatment outcomes and quality of life. For this reason the following referrals have been or will be made:  *** Medical oncology to discuss chemotherapy   *** Dentistry for dental evaluation, possible extractions in the radiation fields, and /or advice on reducing risk of cavities, osteoradionecrosis, or other oral issues.  *** Nutritionist for nutrition support during and after treatment.  *** Speech language pathology for swallowing and/or speech therapy.  *** Social work for social support.   *** Physical therapy due to risk of lymphedema in neck and deconditioning.  *** Baseline labs including TSH.  On date of service, in total, I spent *** minutes on this encounter. Patient was seen in person.  __________________________________________   Eppie Gibson, MD  This document serves as a record of services personally performed by Eppie Gibson, MD. It was created on her behalf by Roney Mans, a trained medical scribe. The creation of this record is based on the scribe's personal observations and the provider's statements to them. This document has been checked and approved by the attending provider.

## 2022-08-13 NOTE — Progress Notes (Signed)
Head and Neck Cancer Location of Tumor / Histology:  SCC (squamous cell carcinoma), scalp/neck   Patient presented with symptoms of:  Per Dr. Vic Blackbird notes, he has a history of multiple BCC and SCC. Mohs surgery for removal of a SCC of left temple and left preauricular with primary repair Nov 2023. Squamous Cell Carcinoma (Left proximal forearm, Left distal forearm) treated with excision Dec 2023. I communicated with Dr. Renato Gails regarding his vertex scalp lesion, the lesion was present in 2021 and treated with Mohs surgery and clear margins were obtained, however the tissue has not healed and a new lesion is concerning for recurrent vertex scalp SCC.   Biopsies revealed:     Nutrition Status Yes No Comments  Weight changes? '[]'$  '[x]'$    Swallowing concerns? '[]'$  '[x]'$  He reports no changes to his diet.  Eating and drinking well.  He reports weird taste in his mouth.  PEG? '[]'$  '[x]'$     Referrals Yes No Comments  Social Work? '[]'$  '[]'$    Dentistry? '[]'$  '[]'$    Swallowing therapy? '[]'$  '[]'$    Nutrition? '[]'$  '[]'$    Med/Onc? '[]'$  '[]'$     Safety Issues Yes No Comments  Prior radiation? '[]'$  '[x]'$    Pacemaker/ICD? '[]'$  '[x]'$    Possible current pregnancy? '[]'$  '[x]'$    Is the patient on methotrexate? '[]'$  '[x]'$     Tobacco/Marijuana/Snuff/ETOH use: Former smoker  Past/Anticipated interventions by otolaryngology, if any:  Vertex scalp lesion started 2 years ago.The lesion was removed and recurred 1 year later. The lesion is growing in size. Biopsy 04/26/22, showed SCC. Currently immunosuppression, Prograf. He is not on dialysis. No abnormal sun exposure. No smoking. Right-handed. Left arm fistula, clotted. Plan for fistula in upper arm. Plans to start dialysis soon.   CONCLUSION:   Soft tissue thickening and irregularity of the right parietal scalp representing the site of biopsy-proven squamous cell carcinoma.  Irregularity of the subjacent right parietal bone cortex without frank invasion or destruction. Consider brain MRI without and with  gadolinium contrast for more confident assessment of possible right parietal calvarial involvement, if clinically warranted.  Impression  62 yo M with a history of multiple facial and extremity BCC and SCC treated with Mohs, presenting with a right parietal/vertex scalp lesion SCC treated with resection and left lat free flap reconstruction 07/18/22.  Plan:  -Continue xeroform vaseline to scalp -Monitor back JP drainage -Follow-up 2 weeks with me -Follow-up 1 week with nursing for wound check -Home health wound care   Electronically signed by: Casilda Carls, MD 08/09/22 1756    -Follow-up 08/16/2022, 08/23/2022   Past/Anticipated interventions by medical oncology, if any:    Current Complaints / other details:

## 2022-08-13 NOTE — Progress Notes (Signed)
Oncology Nurse Navigator Documentation   Placed introductory call to new referral patient Mario Woods. Introduced myself as the H&N oncology nurse navigator that works with Dr. Isidore Moos to whom he has been referred by Dr. Nicolette Bang. He confirmed understanding of referral. Briefly explained my role as his navigator, provided my contact information.  Confirmed understanding of upcoming appts and [place] location, explained arrival and registration process.  I encouraged him to call with questions/concerns as he moves forward with appts and procedures.   He verbalized understanding of information provided, expressed appreciation for my call.   Navigator Initial Assessment Employment Status: he is working Currently on [employer] / STD: na Living Situation: wife, family Support System: wife, family PCP: Donato Heinz MD PCD: Financial Concerns:no Transportation Needs: no Sensory Deficits:no Language Barriers/Interpreter Needed:  no Ambulation Needs: no Psychosocial Needs:  no Concerns/Needs Understanding Cancer:  addressed/answered by navigator to best of ability Self-Expressed Needs: no   Clinical biochemist, BSN, OCN Head & Neck Oncology Nurse [place] at [facility] Phone # [phone]  Fax # [phone]

## 2022-08-14 ENCOUNTER — Ambulatory Visit
Admission: RE | Admit: 2022-08-14 | Discharge: 2022-08-14 | Discharge status: Home / Self Care | Payer: Managed Care, Other (non HMO) | Source: Ambulatory Visit | Attending: Radiation Oncology | Admitting: Radiation Oncology

## 2022-08-14 ENCOUNTER — Encounter: Payer: Self-pay | Admitting: Radiation Oncology

## 2022-08-14 ENCOUNTER — Ambulatory Visit
Admission: RE | Admit: 2022-08-14 | Discharge: 2022-08-14 | Disposition: A | Discharge status: Home / Self Care | Payer: Managed Care, Other (non HMO) | Source: Ambulatory Visit | Attending: Radiation Oncology | Admitting: Radiation Oncology

## 2022-08-14 ENCOUNTER — Other Ambulatory Visit: Payer: Self-pay

## 2022-08-14 VITALS — BP 103/55 | HR 69 | Temp 97.5°F | Resp 24 | Ht 77.0 in | Wt 287.4 lb

## 2022-08-14 DIAGNOSIS — Z79899 Other long term (current) drug therapy: Secondary | ICD-10-CM | POA: Diagnosis not present

## 2022-08-14 DIAGNOSIS — C4442 Squamous cell carcinoma of skin of scalp and neck: Secondary | ICD-10-CM | POA: Diagnosis not present

## 2022-08-14 DIAGNOSIS — Z87891 Personal history of nicotine dependence: Secondary | ICD-10-CM | POA: Diagnosis not present

## 2022-08-14 DIAGNOSIS — Z940 Kidney transplant status: Secondary | ICD-10-CM | POA: Diagnosis not present

## 2022-08-14 DIAGNOSIS — N189 Chronic kidney disease, unspecified: Secondary | ICD-10-CM | POA: Insufficient documentation

## 2022-08-14 DIAGNOSIS — Z800 Family history of malignant neoplasm of digestive organs: Secondary | ICD-10-CM | POA: Diagnosis not present

## 2022-08-14 NOTE — Progress Notes (Signed)
Oncology Nurse Navigator Documentation   Met with patient before his initial consult with Dr. Isidore Moos in office. His wife was present on speaker phone and I answered several of their questions to the best of my ability. Further introduced myself as his/their Navigator, explained my role as a member of the Care Team. I provided them with my direct contact information.  Assisted with post-consult appt scheduling. They verbalized understanding of information provided. I encouraged them to call with questions/concerns moving forward.  Harlow Asa, RN, BSN, OCN Head & Neck Oncology Nurse [place] at [place] [phone]

## 2022-08-23 ENCOUNTER — Other Ambulatory Visit (HOSPITAL_COMMUNITY): Payer: Self-pay

## 2022-08-27 ENCOUNTER — Ambulatory Visit: Payer: Managed Care, Other (non HMO) | Admitting: Radiation Oncology

## 2022-08-27 ENCOUNTER — Other Ambulatory Visit: Payer: Self-pay

## 2022-08-27 ENCOUNTER — Encounter (HOSPITAL_COMMUNITY)
Admission: RE | Admit: 2022-08-27 | Discharge: 2022-08-27 | Disposition: A | Discharge status: Home / Self Care | Payer: Managed Care, Other (non HMO) | Source: Ambulatory Visit | Attending: Nephrology | Admitting: Nephrology

## 2022-08-27 ENCOUNTER — Emergency Department (HOSPITAL_COMMUNITY): Payer: Managed Care, Other (non HMO)

## 2022-08-27 ENCOUNTER — Emergency Department (HOSPITAL_COMMUNITY)
Admission: EM | Admit: 2022-08-27 | Discharge: 2022-08-27 | Disposition: A | Discharge status: Home / Self Care | Payer: Managed Care, Other (non HMO) | Attending: Emergency Medicine | Admitting: Emergency Medicine

## 2022-08-27 DIAGNOSIS — Z7982 Long term (current) use of aspirin: Secondary | ICD-10-CM | POA: Diagnosis not present

## 2022-08-27 DIAGNOSIS — N184 Chronic kidney disease, stage 4 (severe): Secondary | ICD-10-CM | POA: Insufficient documentation

## 2022-08-27 DIAGNOSIS — T782XXA Anaphylactic shock, unspecified, initial encounter: Secondary | ICD-10-CM | POA: Insufficient documentation

## 2022-08-27 DIAGNOSIS — R0602 Shortness of breath: Secondary | ICD-10-CM | POA: Diagnosis present

## 2022-08-27 LAB — CBC WITH DIFFERENTIAL/PLATELET
Abs Immature Granulocytes: 0.25 10*3/uL — ABNORMAL HIGH (ref 0.00–0.07)
Basophils Absolute: 0 10*3/uL (ref 0.0–0.1)
Basophils Relative: 0 %
Eosinophils Absolute: 0 10*3/uL (ref 0.0–0.5)
Eosinophils Relative: 0 %
HCT: 31.7 % — ABNORMAL LOW (ref 39.0–52.0)
Hemoglobin: 9.7 g/dL — ABNORMAL LOW (ref 13.0–17.0)
Immature Granulocytes: 3 %
Lymphocytes Relative: 3 %
Lymphs Abs: 0.3 10*3/uL — ABNORMAL LOW (ref 0.7–4.0)
MCH: 31.1 pg (ref 26.0–34.0)
MCHC: 30.6 g/dL (ref 30.0–36.0)
MCV: 101.6 fL — ABNORMAL HIGH (ref 80.0–100.0)
Monocytes Absolute: 0.1 10*3/uL (ref 0.1–1.0)
Monocytes Relative: 1 %
Neutro Abs: 7.9 10*3/uL — ABNORMAL HIGH (ref 1.7–7.7)
Neutrophils Relative %: 93 %
Platelets: 241 10*3/uL (ref 150–400)
RBC: 3.12 MIL/uL — ABNORMAL LOW (ref 4.22–5.81)
RDW: 14.9 % (ref 11.5–15.5)
WBC: 8.5 10*3/uL (ref 4.0–10.5)
nRBC: 0 % (ref 0.0–0.2)

## 2022-08-27 LAB — BASIC METABOLIC PANEL
Anion gap: 14 (ref 5–15)
BUN: 74 mg/dL — ABNORMAL HIGH (ref 6–20)
CO2: 17 mmol/L — ABNORMAL LOW (ref 22–32)
Calcium: 8.9 mg/dL (ref 8.9–10.3)
Chloride: 110 mmol/L (ref 98–111)
Creatinine, Ser: 6.18 mg/dL — ABNORMAL HIGH (ref 0.61–1.24)
GFR, Estimated: 10 mL/min — ABNORMAL LOW (ref >60)
Glucose, Bld: 169 mg/dL — ABNORMAL HIGH (ref 70–99)
Potassium: 4.8 mmol/L (ref 3.5–5.1)
Sodium: 141 mmol/L (ref 135–145)

## 2022-08-27 LAB — TROPONIN I (HIGH SENSITIVITY): Troponin I (High Sensitivity): 12 ng/L (ref <18)

## 2022-08-27 MED ORDER — DIPHENHYDRAMINE HCL 50 MG/ML IJ SOLN
12.5000 mg | Freq: Once | INTRAMUSCULAR | Status: AC
  Administered 2022-08-27: 12.5 mg via INTRAVENOUS
  Filled 2022-08-27: qty 1

## 2022-08-27 MED ORDER — FAMOTIDINE IN NACL 20-0.9 MG/50ML-% IV SOLN
20.0000 mg | Freq: Once | INTRAVENOUS | Status: AC
  Administered 2022-08-27: 20 mg via INTRAVENOUS
  Filled 2022-08-27: qty 50

## 2022-08-27 MED ORDER — METHYLPREDNISOLONE SODIUM SUCC 125 MG IJ SOLR
125.0000 mg | Freq: Once | INTRAMUSCULAR | Status: AC
  Administered 2022-08-27: 125 mg via INTRAVENOUS
  Filled 2022-08-27: qty 2

## 2022-08-27 MED ORDER — SODIUM CHLORIDE 0.9 % IV BOLUS
500.0000 mL | Freq: Once | INTRAVENOUS | Status: AC
  Administered 2022-08-27: 500 mL via INTRAVENOUS

## 2022-08-27 MED ORDER — SODIUM CHLORIDE 0.9 % IV BOLUS
250.0000 mL | Freq: Once | INTRAVENOUS | Status: AC
  Administered 2022-08-27: 250 mL via INTRAVENOUS

## 2022-08-27 MED ORDER — SODIUM CHLORIDE 0.9 % IV SOLN: Freq: Once | INTRAVENOUS | Status: AC

## 2022-08-27 MED ORDER — SODIUM CHLORIDE 0.9 % IV SOLN
510.0000 mg | INTRAVENOUS | Status: DC
  Administered 2022-08-27: 510 mg via INTRAVENOUS
  Filled 2022-08-27: qty 510

## 2022-08-27 MED ORDER — EPINEPHRINE 0.3 MG/0.3ML IJ SOAJ
0.3000 mg | Freq: Once | INTRAMUSCULAR | Status: AC
  Administered 2022-08-27: 0.3 mg via INTRAMUSCULAR
  Filled 2022-08-27: qty 0.3

## 2022-08-27 NOTE — ED Notes (Signed)
Patient verbalizes understanding of discharge instructions. Opportunity for questioning and answers were provided. Armband removed by staff, pt discharged from ED. Pt ambulatory to ED waiting room with steady gait.  

## 2022-08-27 NOTE — ED Triage Notes (Addendum)
Pt came from infusion clinic here at [facility]. PT was receiving an iron infusion and began having SOB.  He also complains of back and Abd pain with hypotension. Pt was given approx. 446mL NS prior to arrival in room.

## 2022-08-27 NOTE — Progress Notes (Addendum)
Pt here today for first dose of IV feraheme.  3 minutes into infusion patient stated he had a little shortness of breath, his "upper body felt like it was stinging" and he began to have nausea but never vomitted,  BP dropped to 89/57.  Infusion was immediately stopped when called to room and patiently was laid back in the recliner and instructed if he needs to vomit turn head to the side.  Hung NS 250cc bag at 999 per protocol.  Hung additional 250cc NS bag at 999 at 0945, BP 81/55.  Nausea and chest tingling and shortness of breath better per patient. BP at 0950 71/42 rapid response called.  Pt stating now he had sharp pains in his back when he inhales. Continuing to monitor. BP remains in the [id number] systolic with NS running at 999, Rapid response took patient to the ER on a stretcher.

## 2022-08-27 NOTE — ED Provider Notes (Signed)
[facility] Provider Note   CSN: [id number] Arrival date & time: 08/27/22  1004     History  Chief Complaint  Patient presents with   Rx to iron inf./SOB    Mario Woods. is a 62 y.o. male.  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  Approximately 3 minutes after the infusion started patient started to feel nauseated and unwell.  He then started complaining of shortness of breath and pain in his upper back and became hypotensive.  They gave a total of 700 of fluid with no improvement in blood pressure.  Patient is currently complaining of feeling some shortness of breath and pain in his upper back as well as a need to defecate.  He denies any rash or itching but when infusion started having a stinging sensation all over his body.  He has never had this infusion before and felt fine prior to them starting it.  He denies any mouth or tongue swelling but just complains of a dry mouth and feeling cold.        Home Medications Prior to Admission medications   Medication Sig Start Date End Date Taking? Authorizing Provider  albuterol (VENTOLIN HFA) 108 (90 Base) MCG/ACT inhaler Inhale 2 puffs into the lungs daily. 09/04/21   [provider]  aspirin EC 81 MG tablet Take 81 mg by mouth daily. 11/16/15   [provider]  calcitRIOL (ROCALTROL) 0.25 MCG capsule Take 0.25 mcg by mouth every Monday, Wednesday, and Friday.    [provider]  furosemide (LASIX) 40 MG tablet Take 40 mg by mouth daily. 07/08/22   [provider]  labetalol  (NORMODYNE) 200 MG tablet Take 400 mg by mouth 3 (three) times daily.    [provider]  MAGNESIUM OXIDE PO Take 500 mg by mouth daily. 07/30/21   [provider]  Multiple Vitamins-Minerals (MULTIVITAMIN WITH MINERALS) tablet Take 1 tablet by mouth daily.    [provider]  oxyCODONE-acetaminophen (PERCOCET) 5-325 MG tablet Take 1 tablet by mouth every 6 (six) hours as needed for severe pain. Patient not taking: Reported on 08/14/2022 06/07/22 06/07/23  Karoline Caldwell, PA-C  predniSONE (DELTASONE) 5 MG tablet Take 5 mg by mouth daily. 05/07/16   [provider]  sodium bicarbonate 650 MG tablet Take 1,950 mg by mouth 3 (three) times daily. 05/13/16   [provider]  sodium zirconium cyclosilicate (LOKELMA) 10 g PACK packet Take 10 g by mouth every other day. 11/28/21   [provider]  tacrolimus (PROGRAF) 1 MG capsule Take 2 mg by mouth 2 (two) times daily. 06/12/16   [provider]      Allergies    Feraheme [ferumoxytol]    Review of Systems   Review of Systems  Physical Exam Updated Vital Signs BP 105/71   Pulse 74   Temp 98.3 F (36.8 C) (Oral)   Resp (!) 23   SpO2 98%  Physical Exam Vitals and nursing note reviewed.  Constitutional:      General: He is not in acute distress.    Appearance:  He is well-developed. He is ill-appearing and diaphoretic.  HENT:     Head: Normocephalic and atraumatic.     Mouth/Throat:     Mouth: Mucous membranes are dry.  Eyes:     Conjunctiva/sclera: Conjunctivae normal.     Pupils: Pupils are equal, round, and reactive to light.  Cardiovascular:     Rate and Rhythm: Normal rate and regular rhythm.     Heart sounds: No murmur heard. Pulmonary:     Effort: Pulmonary effort is normal. No respiratory distress.     Breath sounds: Normal breath sounds. No wheezing or rales.  Abdominal:     General: There is no distension.     Palpations: Abdomen is soft.     Tenderness: There is no  abdominal tenderness. There is no guarding or rebound.  Musculoskeletal:        General: No tenderness. Normal range of motion.     Cervical back: Normal range of motion and neck supple.  Skin:    General: Skin is warm.     Coloration: Skin is pale.     Findings: No erythema or rash.  Neurological:     Mental Status: He is alert and oriented to person, place, and time. Mental status is at baseline.  Psychiatric:        Behavior: Behavior normal.     ED Results / Procedures / Treatments   Labs (all labs ordered are listed, but only abnormal results are displayed) Labs Reviewed  CBC WITH DIFFERENTIAL/PLATELET - Abnormal; Notable for the following components:      Result Value   RBC 3.12 (*)    Hemoglobin 9.7 (*)    HCT 31.7 (*)    MCV 101.6 (*)    Neutro Abs 7.9 (*)    Lymphs Abs 0.3 (*)    Abs Immature Granulocytes 0.25 (*)    All other components within normal limits  BASIC METABOLIC PANEL - Abnormal; Notable for the following components:   CO2 17 (*)    Glucose, Bld 169 (*)    BUN 74 (*)    Creatinine, Ser 6.18 (*)    GFR, Estimated 10 (*)    All other components within normal limits  TROPONIN I (HIGH SENSITIVITY)  TROPONIN I (HIGH SENSITIVITY)    EKG EKG Interpretation  Date/Time:  Tuesday August 27 2022 10:12:50 EDT Ventricular Rate:  66 PR Interval:  180 QRS Duration: 101 QT Interval:  431 QTC Calculation: 452 R Axis:   40 Text Interpretation: Sinus rhythm Low voltage, precordial leads Minimal ST elevation, inferior leads No significant change since last tracing Confirmed by Blanchie Dessert [id number]) on 08/27/2022 11:54:04 AM  Radiology DG Chest Port 1 View  Result Date: 08/27/2022 CLINICAL DATA:  Shortness of breath EXAM: PORTABLE CHEST 1 VIEW COMPARISON:  Chest radiograph 07/25/2014 FINDINGS: The heart is mildly enlarged. The upper mediastinal contours are normal. There is no focal consolidation or pulmonary edema. There is no pleural effusion or  pneumothorax There is no acute osseous abnormality. IMPRESSION: Mild cardiomegaly. Otherwise, no radiographic evidence of acute cardiopulmonary process. Electronically Signed   By: Valetta Mole M.D.   On: 08/27/2022 11:56    Procedures Procedures    Medications Ordered in ED Medications  sodium chloride 0.9 % bolus 500 mL (0 mLs Intravenous Stopped 08/27/22 1135)  methylPREDNISolone sodium succinate (SOLU-MEDROL) 125 mg/2 mL injection 125 mg (125 mg Intravenous Given 08/27/22 1034)  EPINEPHrine (EPI-PEN) injection 0.3 mg (0.3 mg Intramuscular Given 08/27/22 1028)  famotidine (PEPCID)  IVPB 20 mg premix (0 mg Intravenous Stopped 08/27/22 1135)  diphenhydrAMINE (BENADRYL) injection 12.5 mg (12.5 mg Intravenous Given 08/27/22 1037)  0.9 %  sodium chloride infusion (0 mLs Intravenous Stopped 08/27/22 1449)  sodium chloride 0.9 % bolus 250 mL (0 mLs Intravenous Stopped 08/27/22 1200)    ED Course/ Medical Decision Making/ A&P                             Medical Decision Making Amount and/or Complexity of Data Reviewed External Data Reviewed: notes. Labs: ordered. Decision-making details documented in ED Course. Radiology: ordered and independent interpretation performed. Decision-making details documented in ED Course. ECG/medicine tests: ordered and independent interpretation performed. Decision-making details documented in ED Course.  Risk Prescription drug management.   Pt with multiple medical problems and comorbidities and presenting today with a complaint that caries a high risk for morbidity and mortality.  Here today from the infusion center with no reaction after starting Feraheme.  Patient is ill-appearing in the room, hypotensive, abdominal cramping and pain with inspiration.  Patient's oxygen saturation is 100% on room air, blood pressure is mildly improved from when he was in the infusion center which his lowest blood pressure was in the 70s.  Concern for anaphylaxis to the Feraheme.   The infusion was stopped immediately upon patient getting symptoms and had the infusion for approximately 3 minutes.  Patient will be given epi for concern for anaphylaxis as well as Solu-Medrol Pepcid.  After patient received epi and above medications blood pressure did transiently improved to 100/60 but then started drifting down again.  Overall patient's symptoms have significantly improved and he reports feeling much better and denies any pain with taking deep breath or back pain at this time.  Blood pressure currently in the 50s and he was placed on a rate of fluid.  I independently interpreted patient's labs and CBC today with stable hemoglobin of 9.7, normal white count, initial troponin negative, CMP with creatinine of 6.18 which is unchanged from prior with sodium of 141 and potassium of 4.8.  EKG without acute findings.  I have independently visualized and interpreted pt's images today. Chest x-ray without acute findings.  No evidence of pulmonary edema or pneumothorax.  3:23 PM Patient's blood pressure is significantly improving he is looking much better.  Findings were discussed with the patient and his wife.  Patient's epi was given at 1030 will reassess at 230 to see if patient is stable for discharge.  3:23 PM Patient is feeling much better.  He was able to stand and go to the bathroom.  Asymptomatic at this time.  I do feel that he is stable for discharge.  He was given return precautions.  CRITICAL CARE Performed by: Daralyn Bert Total critical care time: 30 minutes Critical care time was exclusive of separately billable procedures and treating other patients. Critical care was necessary to treat or prevent imminent or life-threatening deterioration. Critical care was time spent personally by me on the following activities: development of treatment plan with patient and/or surrogate as well as nursing, discussions with consultants, evaluation of patient's response to treatment,  examination of patient, obtaining history from patient or surrogate, ordering and performing treatments and interventions, ordering and review of laboratory studies, ordering and review of radiographic studies, pulse oximetry and re-evaluation of patient's condition.        Final Clinical Impression(s) / ED Diagnoses Final diagnoses:  Anaphylaxis, initial encounter  Rx / DC Orders ED Discharge Orders     None         Blanchie Dessert, MD 08/27/22 1524

## 2022-08-27 NOTE — Discharge Instructions (Signed)
Lab work today looks okay.  You had an anaphylactic reaction to the [facility] and you should never take that medication again.  The steroids will be in your system until tomorrow.  Check your blood pressure tonight and if it still on the lower side hold your medication tonight.  If you start having any trouble breathing, feeling like you are in a pass out, really low blood pressures return to the emergency room.

## 2022-08-29 ENCOUNTER — Ambulatory Visit: Payer: Managed Care, Other (non HMO) | Admitting: Radiation Oncology

## 2022-08-30 ENCOUNTER — Other Ambulatory Visit: Payer: Self-pay

## 2022-08-30 ENCOUNTER — Ambulatory Visit
Admission: RE | Admit: 2022-08-30 | Discharge: 2022-08-30 | Disposition: A | Discharge status: Home / Self Care | Payer: Managed Care, Other (non HMO) | Source: Ambulatory Visit | Attending: Radiation Oncology | Admitting: Radiation Oncology

## 2022-08-30 DIAGNOSIS — C4442 Squamous cell carcinoma of skin of scalp and neck: Secondary | ICD-10-CM | POA: Insufficient documentation

## 2022-08-30 NOTE — Progress Notes (Signed)
Oncology Nurse Navigator Documentation   I called Mr. Ladzinski today to let him know that his radiation treatment date has been postponed due to continued healing at his surgical site. Mr. Rauch was already aware after seeing Dr. Jaclyn Shaggy at [facility] today. He is aware that his radiation treatments will start on 4/15 at 8:15 am and that he will receive a calendar for the rest of his radiation appointments.   Harlow Asa RN, BSN, OCN Head & Neck Oncology Nurse [place] at [facility] Phone # [phone]  Fax # [phone]

## 2022-08-30 NOTE — Progress Notes (Signed)
Oncology Nurse Navigator Documentation    Mr. Verge tolerated CT simulation today without difficulty. He has my contact information if he has any questions during his treatment.   Harlow Asa RN, BSN, OCN Head & Neck Oncology Nurse [place] at [facility] Phone # [phone]  Fax # [phone]

## 2022-09-04 ENCOUNTER — Encounter (HOSPITAL_COMMUNITY): Payer: Managed Care, Other (non HMO)

## 2022-09-11 ENCOUNTER — Ambulatory Visit: Payer: Managed Care, Other (non HMO)

## 2022-09-12 ENCOUNTER — Ambulatory Visit: Payer: Managed Care, Other (non HMO)

## 2022-09-13 ENCOUNTER — Ambulatory Visit: Payer: Managed Care, Other (non HMO)

## 2022-09-16 ENCOUNTER — Ambulatory Visit (INDEPENDENT_AMBULATORY_CARE_PROVIDER_SITE_OTHER): Payer: Managed Care, Other (non HMO) | Admitting: Physician Assistant

## 2022-09-16 ENCOUNTER — Ambulatory Visit (HOSPITAL_COMMUNITY)
Admission: RE | Admit: 2022-09-16 | Discharge: 2022-09-16 | Disposition: A | Discharge status: Home / Self Care | Payer: Managed Care, Other (non HMO) | Source: Ambulatory Visit | Attending: Surgery | Admitting: Surgery

## 2022-09-16 ENCOUNTER — Encounter: Payer: Self-pay | Admitting: Physician Assistant

## 2022-09-16 ENCOUNTER — Other Ambulatory Visit: Payer: Self-pay | Admitting: *Deleted

## 2022-09-16 ENCOUNTER — Ambulatory Visit: Payer: Managed Care, Other (non HMO)

## 2022-09-16 VITALS — BP 138/76 | HR 62 | Temp 97.7°F | Resp 16 | Ht 77.0 in | Wt 286.0 lb

## 2022-09-16 DIAGNOSIS — N184 Chronic kidney disease, stage 4 (severe): Secondary | ICD-10-CM | POA: Insufficient documentation

## 2022-09-16 NOTE — Progress Notes (Signed)
Postoperative Access Visit   History of Present Illness   Mario Woods. is a 62 y.o. year old male who presents for postoperative follow-up for:  Left arm AV fistula 06/07/22 by Dr. Myra Gianotti. He was asked to follow up at request of Dr. Arrie Aran due to failure to mature. The patient's wounds are well healed healed.  The patient notes no steal symptoms. He has had a failed transplant and currently has other transplant that is failing. He is not on HD. He was recently hospitalized 1 month ago at time he had Mohs surgery for [facility] on his scalp and attempted to dialyze him via his left AV fistula due to elevated potassium but he explains that they infiltrated his fistula. Made 3 attempts and ultimately were unable to use it. They ended up using medication to get his potassium down and have not needed to access the fistula since.  He is anticipating for possible need for start dialysis in near future so Dr. Arrie Aran wanted it evaluated. Patient states that he has had several duplexes of it at [facility] all showing that it is functioning well.   Physical Examination   Vitals:   09/16/22 1451  BP: 138/76  Pulse: 62  Resp: 16  Temp: 97.7 F (36.5 C)  TempSrc: Temporal  SpO2: 99%  Weight: 286 lb (129.7 kg)  Height: 6\' 5"  (1.956 m)   Body mass index is 33.91 kg/m.  left arm Incision is well healed, 2+ radial pulse, hand grip is 5/5, sensation in digits is intact, palpable thrill, bruit can be auscultated     Non invasive vascular lab: Findings:  +--------------------+----------+-----------------+--------+  AVF                PSV (cm/s)Flow Vol (mL/min)Comments  +--------------------+----------+-----------------+--------+  Native artery inflow   263          2767                 +--------------------+----------+-----------------+--------+  AVF Anastomosis        242                               +--------------------+----------+-----------------+--------+      +------------+----------+-------------+----------+--------------+  OUTFLOW VEINPSV (cm/s)Diameter (cm)Depth (cm)   Describe     +------------+----------+-------------+----------+--------------+  Prox UA        321        0.79        0.48                   +------------+----------+-------------+----------+--------------+  Mid UA         291        0.72        0.47   branch 0.43 cm  +------------+----------+-------------+----------+--------------+  Dist UA        169        0.84        0.72                   +------------+----------+-------------+----------+--------------+  AC Fossa       217        0.90        0.39                   +------------+----------+-------------+----------+--------------+   Summary:  Patent left BC AVF. High flow volume. Branch in the mid upper arm. Outflow vein slightly tortuous.   Medical Decision Making  Mario Woods. is a 62 y.o. year old male who presents s/p left arm AV fistula creation on 06/07/22 by Dr. Myra Gianotti.  Duplex today shows adequate volume flow. His fistula appears to have matured nicely and is of good depth. Hard to explain why there was difficulty with cannulation, however this was only attempted on one occasion. Do not feel there is any utility in a fistulogram at this time and also would not want to give him contrast. I recommend re attempt at cannulation whenever he needs to have HD. If there continues to be issues at that point we can evaluate further with fistulogram or ultimately he would need to be evaluated for new access.    Graceann Congress, PA-C [facility] Office: [phone]  Clinic MD: Myra Gianotti

## 2022-09-17 ENCOUNTER — Ambulatory Visit: Payer: Managed Care, Other (non HMO)

## 2022-09-18 ENCOUNTER — Ambulatory Visit: Payer: Managed Care, Other (non HMO)

## 2022-09-19 ENCOUNTER — Ambulatory Visit: Payer: Managed Care, Other (non HMO)

## 2022-09-20 ENCOUNTER — Ambulatory Visit: Payer: Managed Care, Other (non HMO)

## 2022-09-20 DIAGNOSIS — C4442 Squamous cell carcinoma of skin of scalp and neck: Secondary | ICD-10-CM | POA: Insufficient documentation

## 2022-09-23 ENCOUNTER — Ambulatory Visit: Payer: Managed Care, Other (non HMO)

## 2022-09-24 ENCOUNTER — Ambulatory Visit: Payer: Managed Care, Other (non HMO)

## 2022-09-24 ENCOUNTER — Inpatient Hospital Stay: Payer: Managed Care, Other (non HMO) | Admitting: Dietician

## 2022-09-24 NOTE — Progress Notes (Signed)
Patient did not show for nutrition appointment. Rescheduled for Tuesday April 30 after radiation.

## 2022-09-25 ENCOUNTER — Ambulatory Visit: Payer: Managed Care, Other (non HMO)

## 2022-09-26 ENCOUNTER — Ambulatory Visit: Payer: Managed Care, Other (non HMO)

## 2022-09-27 ENCOUNTER — Ambulatory Visit: Payer: Managed Care, Other (non HMO)

## 2022-09-30 ENCOUNTER — Ambulatory Visit
Admission: RE | Admit: 2022-09-30 | Discharge: 2022-09-30 | Disposition: A | Discharge status: Home / Self Care | Payer: Managed Care, Other (non HMO) | Source: Ambulatory Visit | Attending: Radiation Oncology

## 2022-09-30 ENCOUNTER — Other Ambulatory Visit: Payer: Self-pay

## 2022-09-30 ENCOUNTER — Ambulatory Visit
Admission: RE | Admit: 2022-09-30 | Discharge: 2022-09-30 | Disposition: A | Discharge status: Home / Self Care | Payer: Managed Care, Other (non HMO) | Source: Ambulatory Visit | Attending: Radiation Oncology | Admitting: Radiation Oncology

## 2022-09-30 ENCOUNTER — Ambulatory Visit: Payer: Managed Care, Other (non HMO)

## 2022-09-30 DIAGNOSIS — C4442 Squamous cell carcinoma of skin of scalp and neck: Secondary | ICD-10-CM | POA: Diagnosis not present

## 2022-09-30 LAB — RAD ONC ARIA SESSION SUMMARY
Course Elapsed Days: 0
Plan Fractions Treated to Date: 1
Plan Prescribed Dose Per Fraction: 2 Gy
Plan Total Fractions Prescribed: 30
Plan Total Prescribed Dose: 60 Gy
Reference Point Dosage Given to Date: 2 Gy
Reference Point Session Dosage Given: 2 Gy
Session Number: 1

## 2022-10-01 ENCOUNTER — Ambulatory Visit
Admission: RE | Admit: 2022-10-01 | Discharge: 2022-10-01 | Disposition: A | Discharge status: Home / Self Care | Payer: Managed Care, Other (non HMO) | Source: Ambulatory Visit | Attending: Radiation Oncology | Admitting: Radiation Oncology

## 2022-10-01 ENCOUNTER — Other Ambulatory Visit: Payer: Self-pay

## 2022-10-01 ENCOUNTER — Ambulatory Visit: Payer: Managed Care, Other (non HMO)

## 2022-10-01 DIAGNOSIS — C4442 Squamous cell carcinoma of skin of scalp and neck: Secondary | ICD-10-CM | POA: Diagnosis not present

## 2022-10-01 LAB — RAD ONC ARIA SESSION SUMMARY
Course Elapsed Days: 1
Plan Fractions Treated to Date: 2
Plan Prescribed Dose Per Fraction: 2 Gy
Plan Total Fractions Prescribed: 30
Plan Total Prescribed Dose: 60 Gy
Reference Point Dosage Given to Date: 4 Gy
Reference Point Session Dosage Given: 2 Gy
Session Number: 2

## 2022-10-02 ENCOUNTER — Ambulatory Visit
Admission: RE | Admit: 2022-10-02 | Discharge: 2022-10-02 | Disposition: A | Discharge status: Home / Self Care | Payer: Managed Care, Other (non HMO) | Source: Ambulatory Visit | Attending: Radiation Oncology | Admitting: Radiation Oncology

## 2022-10-02 ENCOUNTER — Other Ambulatory Visit: Payer: Self-pay

## 2022-10-02 ENCOUNTER — Ambulatory Visit: Payer: Managed Care, Other (non HMO)

## 2022-10-02 DIAGNOSIS — C4442 Squamous cell carcinoma of skin of scalp and neck: Secondary | ICD-10-CM | POA: Diagnosis not present

## 2022-10-02 LAB — RAD ONC ARIA SESSION SUMMARY
Course Elapsed Days: 2
Plan Fractions Treated to Date: 3
Plan Prescribed Dose Per Fraction: 2 Gy
Plan Total Fractions Prescribed: 30
Plan Total Prescribed Dose: 60 Gy
Reference Point Dosage Given to Date: 6 Gy
Reference Point Session Dosage Given: 2 Gy
Session Number: 3

## 2022-10-03 ENCOUNTER — Other Ambulatory Visit: Payer: Self-pay

## 2022-10-03 ENCOUNTER — Ambulatory Visit: Payer: Managed Care, Other (non HMO)

## 2022-10-03 ENCOUNTER — Ambulatory Visit
Admission: RE | Admit: 2022-10-03 | Discharge: 2022-10-03 | Disposition: A | Discharge status: Home / Self Care | Payer: Managed Care, Other (non HMO) | Source: Ambulatory Visit | Attending: Radiation Oncology | Admitting: Radiation Oncology

## 2022-10-03 DIAGNOSIS — C4442 Squamous cell carcinoma of skin of scalp and neck: Secondary | ICD-10-CM | POA: Diagnosis not present

## 2022-10-03 LAB — RAD ONC ARIA SESSION SUMMARY
Course Elapsed Days: 3
Plan Fractions Treated to Date: 4
Plan Prescribed Dose Per Fraction: 2 Gy
Plan Total Fractions Prescribed: 30
Plan Total Prescribed Dose: 60 Gy
Reference Point Dosage Given to Date: 8 Gy
Reference Point Session Dosage Given: 2 Gy
Session Number: 4

## 2022-10-04 ENCOUNTER — Ambulatory Visit: Payer: Managed Care, Other (non HMO)

## 2022-10-04 ENCOUNTER — Other Ambulatory Visit: Payer: Self-pay

## 2022-10-04 ENCOUNTER — Ambulatory Visit
Admission: RE | Admit: 2022-10-04 | Discharge: 2022-10-04 | Disposition: A | Discharge status: Home / Self Care | Payer: Managed Care, Other (non HMO) | Source: Ambulatory Visit | Attending: Radiation Oncology

## 2022-10-04 DIAGNOSIS — C4442 Squamous cell carcinoma of skin of scalp and neck: Secondary | ICD-10-CM | POA: Diagnosis not present

## 2022-10-04 LAB — RAD ONC ARIA SESSION SUMMARY
Course Elapsed Days: 4
Plan Fractions Treated to Date: 5
Plan Prescribed Dose Per Fraction: 2 Gy
Plan Total Fractions Prescribed: 30
Plan Total Prescribed Dose: 60 Gy
Reference Point Dosage Given to Date: 10 Gy
Reference Point Session Dosage Given: 2 Gy
Session Number: 5

## 2022-10-05 ENCOUNTER — Ambulatory Visit: Payer: Managed Care, Other (non HMO)

## 2022-10-07 ENCOUNTER — Ambulatory Visit
Admission: RE | Admit: 2022-10-07 | Discharge: 2022-10-07 | Disposition: A | Discharge status: Home / Self Care | Payer: Managed Care, Other (non HMO) | Source: Ambulatory Visit | Attending: Radiation Oncology | Admitting: Radiation Oncology

## 2022-10-07 ENCOUNTER — Ambulatory Visit: Payer: Managed Care, Other (non HMO)

## 2022-10-07 ENCOUNTER — Other Ambulatory Visit: Payer: Self-pay

## 2022-10-07 DIAGNOSIS — C4442 Squamous cell carcinoma of skin of scalp and neck: Secondary | ICD-10-CM | POA: Diagnosis not present

## 2022-10-07 LAB — RAD ONC ARIA SESSION SUMMARY
Course Elapsed Days: 7
Plan Fractions Treated to Date: 6
Plan Prescribed Dose Per Fraction: 2 Gy
Plan Total Fractions Prescribed: 30
Plan Total Prescribed Dose: 60 Gy
Reference Point Dosage Given to Date: 12 Gy
Reference Point Session Dosage Given: 2 Gy
Session Number: 6

## 2022-10-08 ENCOUNTER — Ambulatory Visit: Payer: Managed Care, Other (non HMO) | Admitting: Dietician

## 2022-10-08 ENCOUNTER — Ambulatory Visit: Payer: Managed Care, Other (non HMO)

## 2022-10-08 ENCOUNTER — Other Ambulatory Visit: Payer: Self-pay

## 2022-10-08 ENCOUNTER — Ambulatory Visit
Admission: RE | Admit: 2022-10-08 | Discharge: 2022-10-08 | Disposition: A | Discharge status: Home / Self Care | Payer: Managed Care, Other (non HMO) | Source: Ambulatory Visit | Attending: Radiation Oncology

## 2022-10-08 DIAGNOSIS — C4442 Squamous cell carcinoma of skin of scalp and neck: Secondary | ICD-10-CM | POA: Diagnosis not present

## 2022-10-08 LAB — RAD ONC ARIA SESSION SUMMARY
Course Elapsed Days: 8
Plan Fractions Treated to Date: 7
Plan Prescribed Dose Per Fraction: 2 Gy
Plan Total Fractions Prescribed: 30
Plan Total Prescribed Dose: 60 Gy
Reference Point Dosage Given to Date: 14 Gy
Reference Point Session Dosage Given: 2 Gy
Session Number: 7

## 2022-10-08 NOTE — Progress Notes (Signed)
Patient did not come for nutrition appointment today. This is the second missed appointment. Message sent to scheduling.

## 2022-10-09 ENCOUNTER — Ambulatory Visit
Admission: RE | Admit: 2022-10-09 | Discharge: 2022-10-09 | Disposition: A | Discharge status: Home / Self Care | Payer: Managed Care, Other (non HMO) | Source: Ambulatory Visit | Attending: Radiation Oncology | Admitting: Radiation Oncology

## 2022-10-09 ENCOUNTER — Telehealth: Payer: Self-pay

## 2022-10-09 ENCOUNTER — Other Ambulatory Visit: Payer: Self-pay

## 2022-10-09 ENCOUNTER — Ambulatory Visit: Payer: Managed Care, Other (non HMO)

## 2022-10-09 DIAGNOSIS — C4442 Squamous cell carcinoma of skin of scalp and neck: Secondary | ICD-10-CM | POA: Diagnosis present

## 2022-10-09 DIAGNOSIS — R0781 Pleurodynia: Secondary | ICD-10-CM | POA: Diagnosis present

## 2022-10-09 LAB — RAD ONC ARIA SESSION SUMMARY
Course Elapsed Days: 9
Plan Fractions Treated to Date: 8
Plan Prescribed Dose Per Fraction: 2 Gy
Plan Total Fractions Prescribed: 30
Plan Total Prescribed Dose: 60 Gy
Reference Point Dosage Given to Date: 16 Gy
Reference Point Session Dosage Given: 2 Gy
Session Number: 8

## 2022-10-09 NOTE — Telephone Encounter (Signed)
Rn called pt to inquire about him missing his last two nutrition appointments with Rosalita Chessman. Pt stated he did not know about these appointments He then went on further to ask what the appointments were for. Rn educated him on the role of our nutritionist. He stated he felt like he did not this service at this time. He states he follows a diet for his kidneys at home (and he has no concerns at this time about that). Rn encouraged pt to reach out to nursing or [employer] (Navigator) if this need changed.

## 2022-10-10 ENCOUNTER — Ambulatory Visit: Payer: Managed Care, Other (non HMO)

## 2022-10-10 ENCOUNTER — Ambulatory Visit
Admission: RE | Admit: 2022-10-10 | Discharge: 2022-10-10 | Disposition: A | Discharge status: Home / Self Care | Payer: Managed Care, Other (non HMO) | Source: Ambulatory Visit | Attending: Radiation Oncology | Admitting: Radiation Oncology

## 2022-10-10 ENCOUNTER — Other Ambulatory Visit: Payer: Self-pay

## 2022-10-10 DIAGNOSIS — C4442 Squamous cell carcinoma of skin of scalp and neck: Secondary | ICD-10-CM | POA: Diagnosis not present

## 2022-10-10 LAB — RAD ONC ARIA SESSION SUMMARY
Course Elapsed Days: 10
Plan Fractions Treated to Date: 9
Plan Prescribed Dose Per Fraction: 2 Gy
Plan Total Fractions Prescribed: 30
Plan Total Prescribed Dose: 60 Gy
Reference Point Dosage Given to Date: 18 Gy
Reference Point Session Dosage Given: 2 Gy
Session Number: 9

## 2022-10-11 ENCOUNTER — Ambulatory Visit: Payer: Managed Care, Other (non HMO)

## 2022-10-11 ENCOUNTER — Ambulatory Visit
Admission: RE | Admit: 2022-10-11 | Discharge: 2022-10-11 | Disposition: A | Discharge status: Home / Self Care | Payer: Managed Care, Other (non HMO) | Source: Ambulatory Visit | Attending: Radiation Oncology | Admitting: Radiation Oncology

## 2022-10-11 ENCOUNTER — Other Ambulatory Visit: Payer: Self-pay

## 2022-10-11 DIAGNOSIS — C4442 Squamous cell carcinoma of skin of scalp and neck: Secondary | ICD-10-CM | POA: Diagnosis not present

## 2022-10-11 LAB — RAD ONC ARIA SESSION SUMMARY
Course Elapsed Days: 11
Plan Fractions Treated to Date: 10
Plan Prescribed Dose Per Fraction: 2 Gy
Plan Total Fractions Prescribed: 30
Plan Total Prescribed Dose: 60 Gy
Reference Point Dosage Given to Date: 20 Gy
Reference Point Session Dosage Given: 2 Gy
Session Number: 10

## 2022-10-12 ENCOUNTER — Ambulatory Visit: Payer: Managed Care, Other (non HMO)

## 2022-10-14 ENCOUNTER — Other Ambulatory Visit: Payer: Self-pay

## 2022-10-14 ENCOUNTER — Ambulatory Visit
Admission: RE | Admit: 2022-10-14 | Discharge: 2022-10-14 | Disposition: A | Discharge status: Home / Self Care | Payer: Managed Care, Other (non HMO) | Source: Ambulatory Visit | Attending: Radiation Oncology | Admitting: Radiation Oncology

## 2022-10-14 ENCOUNTER — Ambulatory Visit
Admission: RE | Admit: 2022-10-14 | Discharge: 2022-10-14 | Disposition: A | Discharge status: Home / Self Care | Payer: Managed Care, Other (non HMO) | Source: Ambulatory Visit | Attending: Radiation Oncology

## 2022-10-14 DIAGNOSIS — C4442 Squamous cell carcinoma of skin of scalp and neck: Secondary | ICD-10-CM | POA: Diagnosis not present

## 2022-10-14 LAB — RAD ONC ARIA SESSION SUMMARY
Course Elapsed Days: 14
Plan Fractions Treated to Date: 11
Plan Prescribed Dose Per Fraction: 2 Gy
Plan Total Fractions Prescribed: 30
Plan Total Prescribed Dose: 60 Gy
Reference Point Dosage Given to Date: 22 Gy
Reference Point Session Dosage Given: 2 Gy
Session Number: 11

## 2022-10-15 ENCOUNTER — Other Ambulatory Visit: Payer: Self-pay

## 2022-10-15 ENCOUNTER — Ambulatory Visit
Admission: RE | Admit: 2022-10-15 | Discharge: 2022-10-15 | Disposition: A | Discharge status: Home / Self Care | Payer: Managed Care, Other (non HMO) | Source: Ambulatory Visit | Attending: Radiation Oncology | Admitting: Radiation Oncology

## 2022-10-15 DIAGNOSIS — C4442 Squamous cell carcinoma of skin of scalp and neck: Secondary | ICD-10-CM | POA: Diagnosis not present

## 2022-10-15 LAB — RAD ONC ARIA SESSION SUMMARY
Course Elapsed Days: 15
Plan Fractions Treated to Date: 12
Plan Prescribed Dose Per Fraction: 2 Gy
Plan Total Fractions Prescribed: 30
Plan Total Prescribed Dose: 60 Gy
Reference Point Dosage Given to Date: 24 Gy
Reference Point Session Dosage Given: 2 Gy
Session Number: 12

## 2022-10-16 ENCOUNTER — Ambulatory Visit
Admission: RE | Admit: 2022-10-16 | Discharge: 2022-10-16 | Disposition: A | Discharge status: Home / Self Care | Payer: Managed Care, Other (non HMO) | Source: Ambulatory Visit | Attending: Radiation Oncology

## 2022-10-16 ENCOUNTER — Other Ambulatory Visit: Payer: Self-pay

## 2022-10-16 DIAGNOSIS — C4442 Squamous cell carcinoma of skin of scalp and neck: Secondary | ICD-10-CM | POA: Diagnosis not present

## 2022-10-16 LAB — RAD ONC ARIA SESSION SUMMARY
Course Elapsed Days: 16
Plan Fractions Treated to Date: 13
Plan Prescribed Dose Per Fraction: 2 Gy
Plan Total Fractions Prescribed: 30
Plan Total Prescribed Dose: 60 Gy
Reference Point Dosage Given to Date: 26 Gy
Reference Point Session Dosage Given: 2 Gy
Session Number: 13

## 2022-10-17 ENCOUNTER — Ambulatory Visit: Payer: Managed Care, Other (non HMO)

## 2022-10-18 ENCOUNTER — Ambulatory Visit: Payer: Managed Care, Other (non HMO)

## 2022-10-19 ENCOUNTER — Ambulatory Visit: Payer: Managed Care, Other (non HMO)

## 2022-10-21 ENCOUNTER — Other Ambulatory Visit: Payer: Self-pay | Admitting: Radiation Oncology

## 2022-10-21 ENCOUNTER — Ambulatory Visit: Payer: Managed Care, Other (non HMO)

## 2022-10-21 ENCOUNTER — Other Ambulatory Visit: Payer: Self-pay

## 2022-10-21 ENCOUNTER — Telehealth: Payer: Self-pay

## 2022-10-21 ENCOUNTER — Ambulatory Visit (HOSPITAL_COMMUNITY)
Admission: RE | Admit: 2022-10-21 | Discharge: 2022-10-21 | Disposition: A | Discharge status: Home / Self Care | Payer: Managed Care, Other (non HMO) | Source: Ambulatory Visit | Attending: Radiation Oncology | Admitting: Radiation Oncology

## 2022-10-21 ENCOUNTER — Ambulatory Visit
Admission: RE | Admit: 2022-10-21 | Discharge: 2022-10-21 | Disposition: A | Discharge status: Home / Self Care | Payer: Managed Care, Other (non HMO) | Source: Ambulatory Visit | Attending: Radiation Oncology | Admitting: Radiation Oncology

## 2022-10-21 DIAGNOSIS — R0781 Pleurodynia: Secondary | ICD-10-CM | POA: Insufficient documentation

## 2022-10-21 DIAGNOSIS — C4442 Squamous cell carcinoma of skin of scalp and neck: Secondary | ICD-10-CM | POA: Insufficient documentation

## 2022-10-21 LAB — RAD ONC ARIA SESSION SUMMARY
Course Elapsed Days: 21
Plan Fractions Treated to Date: 14
Plan Prescribed Dose Per Fraction: 2 Gy
Plan Total Fractions Prescribed: 30
Plan Total Prescribed Dose: 60 Gy
Reference Point Dosage Given to Date: 28 Gy
Reference Point Session Dosage Given: 2 Gy
Session Number: 14

## 2022-10-21 NOTE — Telephone Encounter (Signed)
Rn called pt to inform him of xray results that showed rib fractures (per Dr. Colletta [place] request) . Pt had fallen on Friday and sustained this injury due to fall. Pt was advised to speak with PCP about additional meds if needed for pain. He was also advised that no referrals are needed at this time. He was grateful for this call.

## 2022-10-22 ENCOUNTER — Other Ambulatory Visit: Payer: Self-pay

## 2022-10-22 ENCOUNTER — Ambulatory Visit
Admission: RE | Admit: 2022-10-22 | Discharge: 2022-10-22 | Disposition: A | Discharge status: Home / Self Care | Payer: Managed Care, Other (non HMO) | Source: Ambulatory Visit | Attending: Radiation Oncology | Admitting: Radiation Oncology

## 2022-10-22 ENCOUNTER — Ambulatory Visit: Payer: Managed Care, Other (non HMO)

## 2022-10-22 DIAGNOSIS — C4442 Squamous cell carcinoma of skin of scalp and neck: Secondary | ICD-10-CM | POA: Diagnosis not present

## 2022-10-22 LAB — RAD ONC ARIA SESSION SUMMARY
Course Elapsed Days: 22
Plan Fractions Treated to Date: 15
Plan Prescribed Dose Per Fraction: 2 Gy
Plan Total Fractions Prescribed: 30
Plan Total Prescribed Dose: 60 Gy
Reference Point Dosage Given to Date: 30 Gy
Reference Point Session Dosage Given: 2 Gy
Session Number: 15

## 2022-10-23 ENCOUNTER — Other Ambulatory Visit: Payer: Self-pay

## 2022-10-23 ENCOUNTER — Ambulatory Visit
Admission: RE | Admit: 2022-10-23 | Discharge: 2022-10-23 | Disposition: A | Discharge status: Home / Self Care | Payer: Managed Care, Other (non HMO) | Source: Ambulatory Visit | Attending: Radiation Oncology

## 2022-10-23 DIAGNOSIS — C4442 Squamous cell carcinoma of skin of scalp and neck: Secondary | ICD-10-CM | POA: Diagnosis not present

## 2022-10-23 LAB — RAD ONC ARIA SESSION SUMMARY
Course Elapsed Days: 23
Plan Fractions Treated to Date: 16
Plan Prescribed Dose Per Fraction: 2 Gy
Plan Total Fractions Prescribed: 30
Plan Total Prescribed Dose: 60 Gy
Reference Point Dosage Given to Date: 32 Gy
Reference Point Session Dosage Given: 2 Gy
Session Number: 16

## 2022-10-24 ENCOUNTER — Other Ambulatory Visit: Payer: Self-pay

## 2022-10-24 ENCOUNTER — Ambulatory Visit
Admission: RE | Admit: 2022-10-24 | Discharge: 2022-10-24 | Disposition: A | Discharge status: Home / Self Care | Payer: Managed Care, Other (non HMO) | Source: Ambulatory Visit | Attending: Radiation Oncology | Admitting: Radiation Oncology

## 2022-10-24 DIAGNOSIS — C4442 Squamous cell carcinoma of skin of scalp and neck: Secondary | ICD-10-CM | POA: Diagnosis not present

## 2022-10-24 LAB — RAD ONC ARIA SESSION SUMMARY
Course Elapsed Days: 24
Plan Fractions Treated to Date: 17
Plan Prescribed Dose Per Fraction: 2 Gy
Plan Total Fractions Prescribed: 30
Plan Total Prescribed Dose: 60 Gy
Reference Point Dosage Given to Date: 34 Gy
Reference Point Session Dosage Given: 2 Gy
Session Number: 17

## 2022-10-25 ENCOUNTER — Other Ambulatory Visit: Payer: Self-pay

## 2022-10-25 ENCOUNTER — Ambulatory Visit
Admission: RE | Admit: 2022-10-25 | Discharge: 2022-10-25 | Disposition: A | Discharge status: Home / Self Care | Payer: Managed Care, Other (non HMO) | Source: Ambulatory Visit | Attending: Radiation Oncology

## 2022-10-25 DIAGNOSIS — C4442 Squamous cell carcinoma of skin of scalp and neck: Secondary | ICD-10-CM | POA: Diagnosis not present

## 2022-10-25 LAB — RAD ONC ARIA SESSION SUMMARY
Course Elapsed Days: 25
Plan Fractions Treated to Date: 18
Plan Prescribed Dose Per Fraction: 2 Gy
Plan Total Fractions Prescribed: 30
Plan Total Prescribed Dose: 60 Gy
Reference Point Dosage Given to Date: 36 Gy
Reference Point Session Dosage Given: 2 Gy
Session Number: 18

## 2022-10-26 ENCOUNTER — Ambulatory Visit: Payer: Managed Care, Other (non HMO)

## 2022-10-28 ENCOUNTER — Ambulatory Visit
Admission: RE | Admit: 2022-10-28 | Discharge: 2022-10-28 | Disposition: A | Discharge status: Home / Self Care | Payer: Managed Care, Other (non HMO) | Source: Ambulatory Visit | Attending: Radiation Oncology

## 2022-10-28 ENCOUNTER — Other Ambulatory Visit: Payer: Self-pay

## 2022-10-28 ENCOUNTER — Ambulatory Visit: Payer: Managed Care, Other (non HMO)

## 2022-10-28 DIAGNOSIS — C4442 Squamous cell carcinoma of skin of scalp and neck: Secondary | ICD-10-CM | POA: Diagnosis not present

## 2022-10-28 LAB — RAD ONC ARIA SESSION SUMMARY
Course Elapsed Days: 28
Plan Fractions Treated to Date: 19
Plan Prescribed Dose Per Fraction: 2 Gy
Plan Total Fractions Prescribed: 30
Plan Total Prescribed Dose: 60 Gy
Reference Point Dosage Given to Date: 38 Gy
Reference Point Session Dosage Given: 2 Gy
Session Number: 19

## 2022-10-29 ENCOUNTER — Other Ambulatory Visit: Payer: Self-pay

## 2022-10-29 ENCOUNTER — Ambulatory Visit: Payer: Managed Care, Other (non HMO)

## 2022-10-29 ENCOUNTER — Ambulatory Visit
Admission: RE | Admit: 2022-10-29 | Discharge: 2022-10-29 | Disposition: A | Discharge status: Home / Self Care | Payer: Managed Care, Other (non HMO) | Source: Ambulatory Visit | Attending: Radiation Oncology | Admitting: Radiation Oncology

## 2022-10-29 DIAGNOSIS — C4442 Squamous cell carcinoma of skin of scalp and neck: Secondary | ICD-10-CM | POA: Diagnosis not present

## 2022-10-29 LAB — RAD ONC ARIA SESSION SUMMARY
Course Elapsed Days: 29
Plan Fractions Treated to Date: 20
Plan Prescribed Dose Per Fraction: 2 Gy
Plan Total Fractions Prescribed: 30
Plan Total Prescribed Dose: 60 Gy
Reference Point Dosage Given to Date: 40 Gy
Reference Point Session Dosage Given: 2 Gy
Session Number: 20

## 2022-10-30 ENCOUNTER — Ambulatory Visit: Payer: Managed Care, Other (non HMO)

## 2022-10-30 ENCOUNTER — Ambulatory Visit
Admission: RE | Admit: 2022-10-30 | Discharge: 2022-10-30 | Disposition: A | Discharge status: Home / Self Care | Payer: Managed Care, Other (non HMO) | Source: Ambulatory Visit | Attending: Radiation Oncology | Admitting: Radiation Oncology

## 2022-10-30 ENCOUNTER — Other Ambulatory Visit: Payer: Self-pay

## 2022-10-30 DIAGNOSIS — C4442 Squamous cell carcinoma of skin of scalp and neck: Secondary | ICD-10-CM | POA: Diagnosis not present

## 2022-10-30 LAB — RAD ONC ARIA SESSION SUMMARY
Course Elapsed Days: 30
Plan Fractions Treated to Date: 21
Plan Prescribed Dose Per Fraction: 2 Gy
Plan Total Fractions Prescribed: 30
Plan Total Prescribed Dose: 60 Gy
Reference Point Dosage Given to Date: 42 Gy
Reference Point Session Dosage Given: 2 Gy
Session Number: 21

## 2022-10-31 ENCOUNTER — Ambulatory Visit
Admission: RE | Admit: 2022-10-31 | Discharge: 2022-10-31 | Disposition: A | Discharge status: Home / Self Care | Payer: Managed Care, Other (non HMO) | Source: Ambulatory Visit | Attending: Radiation Oncology | Admitting: Radiation Oncology

## 2022-10-31 ENCOUNTER — Other Ambulatory Visit: Payer: Self-pay

## 2022-10-31 ENCOUNTER — Ambulatory Visit: Payer: Managed Care, Other (non HMO)

## 2022-10-31 DIAGNOSIS — C4442 Squamous cell carcinoma of skin of scalp and neck: Secondary | ICD-10-CM | POA: Diagnosis not present

## 2022-10-31 LAB — RAD ONC ARIA SESSION SUMMARY
Course Elapsed Days: 31
Plan Fractions Treated to Date: 22
Plan Prescribed Dose Per Fraction: 2 Gy
Plan Total Fractions Prescribed: 30
Plan Total Prescribed Dose: 60 Gy
Reference Point Dosage Given to Date: 44 Gy
Reference Point Session Dosage Given: 2 Gy
Session Number: 22

## 2022-11-01 ENCOUNTER — Other Ambulatory Visit: Payer: Self-pay

## 2022-11-01 ENCOUNTER — Ambulatory Visit: Payer: Managed Care, Other (non HMO)

## 2022-11-01 ENCOUNTER — Ambulatory Visit
Admission: RE | Admit: 2022-11-01 | Discharge: 2022-11-01 | Disposition: A | Discharge status: Home / Self Care | Payer: Managed Care, Other (non HMO) | Source: Ambulatory Visit | Attending: Radiation Oncology | Admitting: Radiation Oncology

## 2022-11-01 DIAGNOSIS — C4442 Squamous cell carcinoma of skin of scalp and neck: Secondary | ICD-10-CM | POA: Diagnosis not present

## 2022-11-01 LAB — RAD ONC ARIA SESSION SUMMARY
Course Elapsed Days: 32
Plan Fractions Treated to Date: 23
Plan Prescribed Dose Per Fraction: 2 Gy
Plan Total Fractions Prescribed: 30
Plan Total Prescribed Dose: 60 Gy
Reference Point Dosage Given to Date: 46 Gy
Reference Point Session Dosage Given: 2 Gy
Session Number: 23

## 2022-11-04 ENCOUNTER — Ambulatory Visit: Payer: Managed Care, Other (non HMO)

## 2022-11-05 ENCOUNTER — Ambulatory Visit
Admission: RE | Admit: 2022-11-05 | Discharge: 2022-11-05 | Disposition: A | Discharge status: Home / Self Care | Payer: Managed Care, Other (non HMO) | Source: Ambulatory Visit | Attending: Radiation Oncology

## 2022-11-05 ENCOUNTER — Other Ambulatory Visit: Payer: Self-pay

## 2022-11-05 DIAGNOSIS — C4442 Squamous cell carcinoma of skin of scalp and neck: Secondary | ICD-10-CM | POA: Diagnosis not present

## 2022-11-05 LAB — RAD ONC ARIA SESSION SUMMARY
Course Elapsed Days: 36
Plan Fractions Treated to Date: 24
Plan Prescribed Dose Per Fraction: 2 Gy
Plan Total Fractions Prescribed: 30
Plan Total Prescribed Dose: 60 Gy
Reference Point Dosage Given to Date: 48 Gy
Reference Point Session Dosage Given: 2 Gy
Session Number: 24

## 2022-11-06 ENCOUNTER — Ambulatory Visit
Admission: RE | Admit: 2022-11-06 | Discharge: 2022-11-06 | Disposition: A | Discharge status: Home / Self Care | Payer: Managed Care, Other (non HMO) | Source: Ambulatory Visit | Attending: Radiation Oncology | Admitting: Radiation Oncology

## 2022-11-06 ENCOUNTER — Other Ambulatory Visit: Payer: Self-pay

## 2022-11-06 DIAGNOSIS — C4442 Squamous cell carcinoma of skin of scalp and neck: Secondary | ICD-10-CM | POA: Diagnosis not present

## 2022-11-06 LAB — RAD ONC ARIA SESSION SUMMARY
Course Elapsed Days: 37
Plan Fractions Treated to Date: 25
Plan Prescribed Dose Per Fraction: 2 Gy
Plan Total Fractions Prescribed: 30
Plan Total Prescribed Dose: 60 Gy
Reference Point Dosage Given to Date: 50 Gy
Reference Point Session Dosage Given: 2 Gy
Session Number: 25

## 2022-11-07 ENCOUNTER — Other Ambulatory Visit: Payer: Self-pay

## 2022-11-07 ENCOUNTER — Ambulatory Visit
Admission: RE | Admit: 2022-11-07 | Discharge: 2022-11-07 | Disposition: A | Discharge status: Home / Self Care | Payer: Managed Care, Other (non HMO) | Source: Ambulatory Visit | Attending: Radiation Oncology | Admitting: Radiation Oncology

## 2022-11-07 DIAGNOSIS — C4442 Squamous cell carcinoma of skin of scalp and neck: Secondary | ICD-10-CM | POA: Diagnosis not present

## 2022-11-07 LAB — RAD ONC ARIA SESSION SUMMARY
Course Elapsed Days: 38
Plan Fractions Treated to Date: 26
Plan Prescribed Dose Per Fraction: 2 Gy
Plan Total Fractions Prescribed: 30
Plan Total Prescribed Dose: 60 Gy
Reference Point Dosage Given to Date: 52 Gy
Reference Point Session Dosage Given: 2 Gy
Session Number: 26

## 2022-11-08 ENCOUNTER — Ambulatory Visit: Payer: Managed Care, Other (non HMO)

## 2022-11-08 ENCOUNTER — Ambulatory Visit
Admission: RE | Admit: 2022-11-08 | Discharge: 2022-11-08 | Disposition: A | Discharge status: Home / Self Care | Payer: Managed Care, Other (non HMO) | Source: Ambulatory Visit | Attending: Radiation Oncology

## 2022-11-08 ENCOUNTER — Other Ambulatory Visit: Payer: Self-pay

## 2022-11-08 DIAGNOSIS — C4442 Squamous cell carcinoma of skin of scalp and neck: Secondary | ICD-10-CM | POA: Diagnosis not present

## 2022-11-08 LAB — RAD ONC ARIA SESSION SUMMARY
Course Elapsed Days: 39
Plan Fractions Treated to Date: 27
Plan Prescribed Dose Per Fraction: 2 Gy
Plan Total Fractions Prescribed: 30
Plan Total Prescribed Dose: 60 Gy
Reference Point Dosage Given to Date: 54 Gy
Reference Point Session Dosage Given: 2 Gy
Session Number: 27

## 2022-11-11 ENCOUNTER — Ambulatory Visit
Admission: RE | Admit: 2022-11-11 | Discharge: 2022-11-11 | Disposition: A | Discharge status: Home / Self Care | Payer: Managed Care, Other (non HMO) | Source: Ambulatory Visit | Attending: Radiation Oncology | Admitting: Radiation Oncology

## 2022-11-11 ENCOUNTER — Ambulatory Visit: Payer: Managed Care, Other (non HMO)

## 2022-11-11 ENCOUNTER — Other Ambulatory Visit: Payer: Self-pay

## 2022-11-11 DIAGNOSIS — C4442 Squamous cell carcinoma of skin of scalp and neck: Secondary | ICD-10-CM | POA: Insufficient documentation

## 2022-11-11 DIAGNOSIS — R0781 Pleurodynia: Secondary | ICD-10-CM | POA: Insufficient documentation

## 2022-11-11 LAB — RAD ONC ARIA SESSION SUMMARY
Course Elapsed Days: 42
Plan Fractions Treated to Date: 28
Plan Prescribed Dose Per Fraction: 2 Gy
Plan Total Fractions Prescribed: 30
Plan Total Prescribed Dose: 60 Gy
Reference Point Dosage Given to Date: 56 Gy
Reference Point Session Dosage Given: 2 Gy
Session Number: 28

## 2022-11-12 ENCOUNTER — Ambulatory Visit: Payer: Managed Care, Other (non HMO)

## 2022-11-13 ENCOUNTER — Other Ambulatory Visit: Payer: Self-pay

## 2022-11-13 ENCOUNTER — Ambulatory Visit
Admission: RE | Admit: 2022-11-13 | Discharge: 2022-11-13 | Disposition: A | Discharge status: Home / Self Care | Payer: Managed Care, Other (non HMO) | Source: Ambulatory Visit | Attending: Radiation Oncology | Admitting: Radiation Oncology

## 2022-11-13 DIAGNOSIS — C4442 Squamous cell carcinoma of skin of scalp and neck: Secondary | ICD-10-CM | POA: Diagnosis not present

## 2022-11-13 LAB — RAD ONC ARIA SESSION SUMMARY
Course Elapsed Days: 44
Plan Fractions Treated to Date: 29
Plan Prescribed Dose Per Fraction: 2 Gy
Plan Total Fractions Prescribed: 30
Plan Total Prescribed Dose: 60 Gy
Reference Point Dosage Given to Date: 58 Gy
Reference Point Session Dosage Given: 2 Gy
Session Number: 29

## 2022-11-14 ENCOUNTER — Ambulatory Visit
Admission: RE | Admit: 2022-11-14 | Discharge: 2022-11-14 | Disposition: A | Discharge status: Home / Self Care | Payer: Managed Care, Other (non HMO) | Source: Ambulatory Visit | Attending: Radiation Oncology | Admitting: Radiation Oncology

## 2022-11-14 ENCOUNTER — Other Ambulatory Visit: Payer: Self-pay

## 2022-11-14 DIAGNOSIS — C4442 Squamous cell carcinoma of skin of scalp and neck: Secondary | ICD-10-CM | POA: Diagnosis not present

## 2022-11-14 LAB — RAD ONC ARIA SESSION SUMMARY
Course Elapsed Days: 45
Plan Fractions Treated to Date: 30
Plan Prescribed Dose Per Fraction: 2 Gy
Plan Total Fractions Prescribed: 30
Plan Total Prescribed Dose: 60 Gy
Reference Point Dosage Given to Date: 60 Gy
Reference Point Session Dosage Given: 2 Gy
Session Number: 30

## 2022-11-14 NOTE — Progress Notes (Signed)
Oncology Nurse Navigator Documentation   Mr. Waltzer completed his radiation today without any difficulties. He will see Dr. Basilio Cairo on 7/5 for follow up. He has my contact information to call me for any questions or needs.  Hedda Slade RN, BSN, OCN Head & Neck Oncology Nurse Navigator [facility] at [facility] Phone # [phone]  Fax # [phone]

## 2022-11-19 NOTE — Radiation Completion Notes (Signed)
Patient Name: Mario Woods, Mario Woods MRN: [id number] Date of Birth: 11-26-1960 Referring Physician: [name], M.D. Date of Service: 2022-11-19 Radiation Oncologist: Lonie Peak, M.D. 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 Staging on 2022-08-14: Squamous cell carcinoma of scalp T=pT3, N=pN0, M=cM0 Intent: Curative     ==========DELIVERED PLANS==========  First Treatment Date: 2022-09-30 - Last Treatment Date: 2022-11-14   Plan Name: HN_Scalp Site: Scalp Technique: 3D Mode: Photon Dose Per Fraction: 2 Gy Prescribed Dose (Delivered / Prescribed): 60 Gy / 60 Gy Prescribed Fxs (Delivered / Prescribed): 30 / 30     ==========ON TREATMENT VISIT DATES========== 2022-09-30, 2022-10-07, 2022-10-14, 2022-10-21, 2022-10-28, 2022-11-05, 2022-11-11     ==========UPCOMING VISITS==========       ==========APPENDIX - ON TREATMENT VISIT NOTES==========   See weekly On Treatment Notes in Epic for details.

## 2022-12-03 NOTE — Progress Notes (Signed)
Mario Woods presents to clinic today for follow up for completion of radiation for cancer of his scalp. He completed treatment on 11-14-22.   Pain issues, if any: Reports discomfort to right eyebrow that radiates upwards, occasionally causes a headaches. States area is tender to the touch and often prevents him from sleeping through the night. Currently managing with OTC acetaminophen Weight changes, if any:  Wt Readings from Last 3 Encounters:  12/13/22 291 lb (132 kg)  09/16/22 286 lb (129.7 kg)  08/27/22 280 lb (127 kg)   Skin: Denies any concerns.  Fatigue: Mild, but reports this is more related to his difficulty sleeping from right-sided forehead pain.  Last dermatology visit was: will resume routine follow-up later this year  Other notable issues, if any: Saw ENT Dr. [name] on 11/29/22. Reported supraorbital pain so she ordered a CT head w/o contrast, done on 12/06/22:  Impression:  No acute intracranial abnormality. Redemonstrated surgical changes related to right parietal scalp squamous cell carcinoma excision and myocutaneous flap reconstruction. No progressive erosive changes along the outer table of the calvarium   Reports feeling more SOB and believes he's having some renal issues, plans to reach out to his nephrologist to address

## 2022-12-13 ENCOUNTER — Encounter: Payer: Self-pay | Admitting: Radiation Oncology

## 2022-12-13 ENCOUNTER — Ambulatory Visit
Admission: RE | Admit: 2022-12-13 | Discharge: 2022-12-13 | Disposition: A | Discharge status: Home / Self Care | Payer: Managed Care, Other (non HMO) | Source: Ambulatory Visit | Attending: Radiation Oncology | Admitting: Radiation Oncology

## 2022-12-13 VITALS — BP 165/86 | HR 62 | Temp 98.0°F | Resp 18 | Ht 77.0 in | Wt 291.0 lb

## 2022-12-13 DIAGNOSIS — Z7952 Long term (current) use of systemic steroids: Secondary | ICD-10-CM | POA: Diagnosis not present

## 2022-12-13 DIAGNOSIS — Z79899 Other long term (current) drug therapy: Secondary | ICD-10-CM | POA: Insufficient documentation

## 2022-12-13 DIAGNOSIS — C4442 Squamous cell carcinoma of skin of scalp and neck: Secondary | ICD-10-CM

## 2022-12-13 DIAGNOSIS — R0602 Shortness of breath: Secondary | ICD-10-CM | POA: Diagnosis not present

## 2022-12-13 DIAGNOSIS — Z7982 Long term (current) use of aspirin: Secondary | ICD-10-CM | POA: Diagnosis not present

## 2022-12-13 DIAGNOSIS — Z923 Personal history of irradiation: Secondary | ICD-10-CM | POA: Diagnosis not present

## 2022-12-13 DIAGNOSIS — Z79621 Long term (current) use of calcineurin inhibitor: Secondary | ICD-10-CM | POA: Insufficient documentation

## 2022-12-13 DIAGNOSIS — R5383 Other fatigue: Secondary | ICD-10-CM | POA: Diagnosis not present

## 2022-12-13 NOTE — Progress Notes (Signed)
Radiation Oncology         [id number]) [phone] ________________________________  Name: Mario Woods. MRN: [id number]  Date: 12/13/2022  DOB: 1961/05/06  Follow-Up Visit Note  Outpatient  CC: Mario Rhodes, MD  Mario Skains, MD  Diagnosis and Prior Radiotherapy:    ICD-10-CM   1. Squamous cell carcinoma, scalp/neck  C44.42     2. Squamous cell carcinoma of scalp  C44.42       Cancer Staging  Squamous cell carcinoma of scalp Staging form: Cutaneous Carcinoma of the Head and Neck, AJCC 8th Edition - Pathologic stage from 08/14/2022: Stage III (pT3, pN0, cM0) - Signed by Lonie Peak, MD on 08/14/2022 Stage prefix: Initial diagnosis Extraosseous extension: Absent  First Treatment Date: 2022-09-30 - Last Treatment Date: 2022-11-14   Plan Name: HN_Scalp Site: Scalp Technique: 3D Mode: Photon Dose Per Fraction: 2 Gy Prescribed Dose (Delivered / Prescribed): 60 Gy / 60 Gy Prescribed Fxs (Delivered / Prescribed): 30 / 30   CHIEF COMPLAINT: Here for follow-up and surveillance of skin cancer  Narrative:  The patient returns today for routine follow-up.   Pain issues, if any: Reports discomfort to right eyebrow that radiates upwards, occasionally causes a headaches. States area is tender to the touch and often prevents him from sleeping through the night. Currently managing with OTC acetaminophen Weight changes, if any:  Wt Readings from Last 3 Encounters:  12/13/22 291 lb (132 kg)  09/16/22 286 lb (129.7 kg)  08/27/22 280 lb (127 kg)   Skin: Denies any concerns.  Fatigue: Mild, but reports this is more related to his difficulty sleeping from right-sided forehead pain.  Last dermatology visit was: will resume routine follow-up later this year  Other notable issues, if any: Saw ENT Dr. [name] on 11/29/22. Reported supraorbital pain so she ordered a CT head w/o contrast, done on 12/06/22:  Impression:  No acute intracranial abnormality. Redemonstrated surgical  changes related to right parietal scalp squamous cell carcinoma excision and myocutaneous flap reconstruction. No progressive erosive changes along the outer table of the calvarium   Reports feeling more SOB and believes he's having some renal issues, plans to reach out to his nephrologist to address                              ALLERGIES:  is allergic to feraheme [ferumoxytol].  Meds: Current Outpatient Medications  Medication Sig Dispense Refill   aspirin EC 81 MG tablet Take 81 mg by mouth daily.     calcitRIOL (ROCALTROL) 0.25 MCG capsule Take 0.25 mcg by mouth every Monday, Wednesday, and Friday.     furosemide (LASIX) 40 MG tablet Take 40 mg by mouth daily.     labetalol (NORMODYNE) 200 MG tablet Take 400 mg by mouth 3 (three) times daily.     MAGNESIUM OXIDE PO Take 500 mg by mouth daily.     Multiple Vitamins-Minerals (MULTIVITAMIN WITH MINERALS) tablet Take 1 tablet by mouth daily.     predniSONE (DELTASONE) 5 MG tablet Take 5 mg by mouth daily.     sodium bicarbonate 650 MG tablet Take 1,950 mg by mouth 3 (three) times daily.     sodium zirconium cyclosilicate (LOKELMA) 10 g PACK packet Take 10 g by mouth every other day.     tacrolimus (PROGRAF) 1 MG capsule Take 2 mg by mouth 2 (two) times daily.     No current facility-administered medications for this encounter.  Physical Findings: The patient is in no acute distress. Patient is alert and oriented.  height is 6\' 5"  (1.956 m) and weight is 291 lb (132 kg). His temporal temperature is 98 F (36.7 C). His blood pressure is 165/86 (abnormal) and his pulse is 62. His respiration is 18 and oxygen saturation is 98%. .    Overall the skin over his scalp looks better and continues to heal.  Medially he still has an area that has not fully keratinized.  Scalp swelling and temporal / facial swelling on the right are stable to improved No palpable adenopathy appreciated in his neck  Lab Findings: Lab Results  Component Value Date    WBC 8.5 08/27/2022   HGB 9.7 (L) 08/27/2022   HCT 31.7 (L) 08/27/2022   MCV 101.6 (H) 08/27/2022   PLT 241 08/27/2022    Radiographic Findings: No results found.  Impression/Plan: He is doing well symptomatically after radiation therapy.  He will continue to follow closely with his surgeon.  I will see him back on an as-needed basis.  He is pleased with this plan and knows not to hesitate to call us if he has any needs.  On date of service, in total, I spent 20 minutes on this encounter. Patient was seen in person.  _____________________________________   Lonie Peak, MD

## 2023-01-03 ENCOUNTER — Telehealth: Payer: Self-pay

## 2023-01-03 NOTE — Telephone Encounter (Signed)
Received VM from Susann Givens, PA-C with [facility] stating that patient is to start dialysis soon, and his nephrology team wanted oncology to weigh in on wether or not he could receive ESA during his dialysis treatments.   Returned Grace's call [phone]) and let her know that patient did not have medical oncologist (he only received surgery and radiation for skin cancer), but from a radiation standpoint there was no contraindication to patient receiving ESA during dialysis. If his nephrologist still had concerns we encourage them to reach out to patient's PCP. Delorise Shiner verbalized understanding and appreciation of call.

## 2023-07-07 ENCOUNTER — Other Ambulatory Visit: Payer: Self-pay | Admitting: Radiation Oncology

## 2023-07-07 ENCOUNTER — Telehealth: Payer: Self-pay | Admitting: Radiation Oncology

## 2023-07-07 ENCOUNTER — Inpatient Hospital Stay
Admission: RE | Admit: 2023-07-07 | Discharge: 2023-07-07 | Disposition: A | Discharge status: Home / Self Care | Payer: Self-pay | Source: Ambulatory Visit | Attending: Radiation Oncology | Admitting: Radiation Oncology

## 2023-07-07 DIAGNOSIS — C4442 Squamous cell carcinoma of skin of scalp and neck: Secondary | ICD-10-CM

## 2023-07-07 NOTE — Telephone Encounter (Signed)
1/27 @ 11:23 am sent via stat fax request for recent MRI to be push to powershare from [facility]. Waiting on images.

## 2023-07-07 NOTE — Telephone Encounter (Signed)
1/27 @ 1:25 pm Called and spoke to [facility] ([facility] Radiology Dept), will push over images to powershare.  Follow up call to [facility], left voicemail for recent MRI to be uploaded to PACs in epic.  Waiting on images.

## 2023-07-07 NOTE — Progress Notes (Signed)
Radiation Oncology         [phone]) [phone] ________________________________  Initial outpatient Re-Consultation  Name: Mario Woods. MRN: [id number]  Date: 07/09/2023  DOB: 07/19/1960  [id number], Jomarie Longs, MD  Juventino Slovak, Ardell Isaacs, *   REFERRING PHYSICIAN: Juventino Slovak, Ardell Isaacs, *  Vallery Sa, MD (NEPHROLOGIST) [facility]  DIAGNOSIS:    ICD-10-CM   1. 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 is a 63 y.o. male who presents today for consideration of radiation therapy in management of his recurrent squamous cell carcinoma of the scalp. The patient has a history significant for multiple BCC's and SCC's. He was initially diagnosed in 2021. He was last seen here on 12-13-22 for a routine follow up after adjuvant radiation.     Last fall, he had a scalp cancer recurrence detected on physical exam, with early October 2024 Brain MRI at [facility], showing a 1.1 cm nonenhancing lesion within the right parietal scalp soft tissue adjacent to surgical material is nonspecific  Salvage surgery then took place with Dr Hezzie Bump. 04-07-23 path reported: SKIN, RIGHT POSTERIOR SCALP LESION, EXCISION:              INVASIVE WELL-DIFFERENTIATED SQUAMOUS CELL CARCINOMA EXTENDING TO THE DEEP MARGIN              PERINEURAL INVASION PRESENT INVOLVING MULTIPLE NERVES               GREATEST DIAMETER OF INVOLVED NERVE OF 0.6 MM  He saw med onc and rad onc at [facility]  - consensus was for re-irradiation - and was referred back here for  that. Was not recommended to receive systemic therapy.  No restaging scans other than MRI Brain.  Dr. Hezzie Bump deems him well healed from surgery.      Photo from surgery:    Photo today:   PREVIOUS RADIATION THERAPY: Yes  09/30/2022-11/14/2022              Site: Scalp Technique: 3D Mode: Photon Dose Per Fraction: 2 Gy Prescribed Dose (Delivered / Prescribed): 60 Gy / 60 Gy Prescribed Fxs (Delivered / Prescribed): 30 / 30  According a fusion performed today, the scalp nodule on his diagnostic MRI was partially covered by 60Gy but located at the margin of his high dose radiation clouds    PAST MEDICAL HISTORY:  has a past medical history of Anemia, Chronic kidney disease, Dialysis AV fistula infection (HCC), Diarrhea, Diverticulosis, Failed kidney transplant, Gout due to renal impairment, H/O kidney transplant, History of blood transfusion, History of bronchitis, History of gout, Hyperlipidemia, Hypertension, Other back pain, Pneumonia, Shortness of breath, Skin cancer, Tingling, and Tubular adenoma of colon.    PAST SURGICAL HISTORY: Past Surgical History:  Procedure Laterality Date   APPENDECTOMY  01/2009   AV FISTULA PLACEMENT     Left   AV FISTULA PLACEMENT Left 06/07/2022   Procedure: LEFT ARM ARTERIOVENOUS (AV) FISTULA CREATION;  Surgeon: Nada Libman, MD;  Location: [facility] OR;  Service: Vascular;  Laterality: Left;  CAPD INSERTION N/A 08/03/2013   Procedure: LAPAROSCOPIC EXPLORATION OF THE ABDOMEN WITH PLACEMENT OF PERITONEAL DIALYSIS CATHETER;  Surgeon: Ardeth Sportsman, MD;  Location: [facility] OR;  Service: General;  Laterality: N/A;   CAPD INSERTION N/A 09/06/2013   Procedure: DIAGNOSTIC LAPAROSCOPY WITH  REPOSITIONING OF CONTINUOUS AMBULATORY PERITONEAL DIALYSIS  (CAPD) CATHETER ;  Surgeon: Ardeth Sportsman, MD;  Location: [facility] OR;  Service: General;  Laterality: N/A;   CAPD REMOVAL N/A 02/08/2014   Procedure: REMOVAL OF PERITONEAL DIALYSIS CATHETER;  Surgeon: Karie Soda, MD;   Location: [facility] OR;  Service: General;  Laterality: N/A;   HERNIA REPAIR  01/2009   primary ventral hernia repair x2    INCISIONAL HERNIA REPAIR  04/27/2012   Procedure: LAPAROSCOPIC INCISIONAL HERNIA;  Surgeon: Emelia Loron, MD;  Location: [facility] OR;  Service: General;  Laterality: N/A;   INSERTION OF MESH  04/27/2012   Procedure: INSERTION OF MESH;  Surgeon: Emelia Loron, MD;  Location: [facility] OR;  Service: General;  Laterality: N/A;   KIDNEY TRANSPLANT  04/2009   KIDNEY TRANSPLANT  2017    FAMILY HISTORY: family history includes Pancreatic cancer in his maternal grandmother; Sjogren's syndrome in his mother.  SOCIAL HISTORY:  reports that he has quit smoking. His smoking use included cigarettes. He has a 5 pack-year smoking history. He has never used smokeless tobacco. He reports that he does not currently use alcohol. He reports that he does not use drugs.  ALLERGIES: Feraheme [ferumoxytol]  MEDICATIONS:  Current Outpatient Medications  Medication Sig Dispense Refill   aspirin EC 81 MG tablet Take 81 mg by mouth daily.     calcitRIOL (ROCALTROL) 0.25 MCG capsule Take 0.25 mcg by mouth every Monday, Wednesday, and Friday.     furosemide (LASIX) 40 MG tablet Take 40 mg by mouth daily.     Multiple Vitamins-Minerals (MULTIVITAMIN WITH MINERALS) tablet Take 1 tablet by mouth daily.     predniSONE (DELTASONE) 5 MG tablet Take 5 mg by mouth daily.     labetalol (NORMODYNE) 200 MG tablet Take 400 mg by mouth 3 (three) times daily. (Patient not taking: Reported on 07/09/2023)     MAGNESIUM OXIDE PO Take 500 mg by mouth daily. (Patient not taking: Reported on 07/09/2023)     sodium bicarbonate 650 MG tablet Take 1,950 mg by mouth 3 (three) times daily. (Patient not taking: Reported on 07/09/2023)     sodium zirconium cyclosilicate (LOKELMA) 10 g PACK packet Take 10 g by mouth every other day. (Patient not taking: Reported on 07/09/2023)     tacrolimus (PROGRAF) 1 MG capsule Take 2 mg by mouth 2  (two) times daily. (Patient not taking: Reported on 07/09/2023)     No current facility-administered medications for this encounter.    REVIEW OF SYSTEMS:  Notable for that above.   PHYSICAL EXAM:  height is 6\' 5"  (1.956 m) and weight is 292 lb 3.2 oz (132.5 kg). His temperature is 97.8 F (36.6 C). His blood pressure is 115/79 and his pulse is 92. His respiration is 20 and oxygen saturation is 100%.   General: Alert and oriented, in no acute distress   HEENT: see photo above of scalp.  Surgical site over right scalp posterolaterally has healed well with no sign of recurrence Neck: Neck is supple, no palpable cervical or supraclavicular lymphadenopathy. Musculoskeletal: well nourished, ambulatory. Neurologic: No obvious focalities. Speech is fluent. Coordination is intact. Psychiatric: Judgment and insight are intact. Affect is appropriate.  ECOG = 1  0 -  Asymptomatic (Fully active, able to carry on all predisease activities without restriction)  1 - Symptomatic but completely ambulatory (Restricted in physically strenuous activity but ambulatory and able to carry out work of a light or sedentary nature. For example, light housework, office work)  2 - Symptomatic, <50% in bed during the day (Ambulatory and capable of all self care but unable to carry out any work activities. Up and about more than 50% of waking hours)  3 - Symptomatic, >50% in bed, but not bedbound (Capable of only limited self-care, confined to bed or chair 50% or more of waking hours)  4 - Bedbound (Completely disabled. Cannot carry on any self-care. Totally confined to bed or chair)  5 - Death   Santiago Glad MM, Creech RH, Tormey DC, et al. [phone]). "Toxicity and response criteria of the [facility]". Am. Evlyn Clines. Oncol. 5 (6): 649-55   LABORATORY DATA:  Lab Results  Component Value Date   WBC 8.5 08/27/2022   HGB 9.7 (L) 08/27/2022   HCT 31.7 (L) 08/27/2022   MCV 101.6 (H) 08/27/2022   PLT 241  08/27/2022   CMP     Component Value Date/Time   NA 141 08/27/2022 1022   K 4.8 08/27/2022 1022   CL 110 08/27/2022 1022   CO2 17 (L) 08/27/2022 1022   GLUCOSE 169 (H) 08/27/2022 1022   BUN 74 (H) 08/27/2022 1022   CREATININE 6.18 (H) 08/27/2022 1022   CREATININE 11.86 (HH) 02/15/2013 1124   CALCIUM 8.9 08/27/2022 1022   CALCIUM 8.9 08/10/2008 0955   PROT 6.7 10/15/2015 1151   PROT 7.1 11/15/2014 1554   ALBUMIN 3.6 10/17/2015 0517   AST 16 10/15/2015 1151   ALT 22 10/15/2015 1151   ALKPHOS 108 10/15/2015 1151   BILITOT 0.8 10/15/2015 1151   GFRNONAA 10 (L) 08/27/2022 1022         RADIOGRAPHY: as above     IMPRESSION/PLAN:  RECURRENT SCC SCALP, EXTENDING TO THE DEEP MARGIN, PERINEURAL INVASION PRESENT INVOLVING MULTIPLE NERVES,GEATEST DIAMETER OF INVOLVED NERVE OF 0.6 MM  Today, I talked to the patient about the findings and work-up thus far.  We discussed the patient's diagnosis of recurrent squamous cell carcinoma of the scalp at the margin of his previous radiation treatment, with aggressive features.  He is at high risk for local recurrence after salvage resection.  He is not deemed to be a good candidate for systemic therapy at this time and the data for systemic therapy is lacking as per recent consult with Dr. Hilary Hertz.  While reirradiation carries local tissue risks, it is probably his best option for cure.  I would keep my fields smaller than his previous treatment to minimize normal tissue exposure.  We discussed the available radiation techniques, and focused on the details of logistics and delivery.     We discussed the risks, benefits, and side effects of a 4-6 week course of daily (M-F) radiotherapy. Side effects may include but not necessarily be limited to: Skin irritation, hair loss, fatigue, injury to soft tissues in the radiation field, injury to skull, injury to brain. No guarantees of treatment were given. A consent form was signed and placed in the patient's  medical record.  Although he does not have any palpable adenopathy given the aggressive nature of his scalp cancer it would be nice to restage his neck with a CT with contrast.  I have contacted his nephrologist and asked for call back to get permission to order a  CT scan with IV contrast as long as this is okay -the patient thinks that should not be a problem since he is getting dialysis but I just want to make sure.   In the meantime we can go ahead and schedule him for radiation planning so that this takes place in relatively close proximity to his restaging scans, allowing treatment to start in a timely manner.  Given the complexity of this case I have also contacted Dr. Hezzie Bump and so we can discuss the patient briefly.  The patient was encouraged to ask questions that I answered to the best of my ability.  Consent form has been signed and placed in his chart..    On date of service, in total, I spent 65 minutes on this encounter. Patient was seen in person.   __________________________________________   Lonie Peak, MD  This document serves as a record of services personally performed by Lonie Peak, MD. It was created on her behalf by Herbie Saxon, a trained medical scribe. The creation of this record is based on the scribe's personal observations and the provider's statements to them. This document has been checked and approved by the attending provider.

## 2023-07-07 NOTE — Progress Notes (Signed)
Histology and Location of Primary Skin Cancer:  Recurrent squamous cell carcinoma of scalp  Mr. Mario Woods presents today to discuss re-irradiation options. Was seen by Dr. Cyndie Chime at [facility], but preferred to receive care closer to where he lives.   Past skin cancers, if any:    SAFETY ISSUES: Prior radiation? Yes: 09/30/2022-11/14/2022   Site: Scalp Technique: 3D Mode: Photon Dose Per Fraction: 2 Gy Prescribed Dose (Delivered / Prescribed): 60 Gy / 60 Gy Prescribed Fxs (Delivered / Prescribed): 30 / 30 Pacemaker/ICD? no Possible current pregnancy? no Is the patient on methotrexate? no  Current Complaints / other details:  None

## 2023-07-09 ENCOUNTER — Encounter: Payer: Self-pay | Admitting: Radiation Oncology

## 2023-07-09 ENCOUNTER — Ambulatory Visit
Admission: RE | Admit: 2023-07-09 | Discharge: 2023-07-09 | Disposition: A | Discharge status: Home / Self Care | Payer: Managed Care, Other (non HMO) | Source: Ambulatory Visit | Attending: Radiation Oncology | Admitting: Radiation Oncology

## 2023-07-09 VITALS — BP 115/79 | HR 92 | Temp 97.8°F | Resp 20 | Ht 77.0 in | Wt 292.2 lb

## 2023-07-09 DIAGNOSIS — Z800 Family history of malignant neoplasm of digestive organs: Secondary | ICD-10-CM | POA: Diagnosis not present

## 2023-07-09 DIAGNOSIS — Z79899 Other long term (current) drug therapy: Secondary | ICD-10-CM | POA: Diagnosis not present

## 2023-07-09 DIAGNOSIS — Z87891 Personal history of nicotine dependence: Secondary | ICD-10-CM | POA: Diagnosis not present

## 2023-07-09 DIAGNOSIS — Z79621 Long term (current) use of calcineurin inhibitor: Secondary | ICD-10-CM | POA: Diagnosis not present

## 2023-07-09 DIAGNOSIS — C4442 Squamous cell carcinoma of skin of scalp and neck: Secondary | ICD-10-CM | POA: Insufficient documentation

## 2023-07-09 DIAGNOSIS — Z7952 Long term (current) use of systemic steroids: Secondary | ICD-10-CM | POA: Insufficient documentation

## 2023-07-09 DIAGNOSIS — Z7982 Long term (current) use of aspirin: Secondary | ICD-10-CM | POA: Insufficient documentation

## 2023-07-09 DIAGNOSIS — Z940 Kidney transplant status: Secondary | ICD-10-CM | POA: Insufficient documentation

## 2023-07-09 DIAGNOSIS — Z992 Dependence on renal dialysis: Secondary | ICD-10-CM | POA: Insufficient documentation

## 2023-07-09 DIAGNOSIS — D631 Anemia in chronic kidney disease: Secondary | ICD-10-CM | POA: Insufficient documentation

## 2023-07-09 DIAGNOSIS — M109 Gout, unspecified: Secondary | ICD-10-CM | POA: Diagnosis not present

## 2023-07-09 DIAGNOSIS — Z85828 Personal history of other malignant neoplasm of skin: Secondary | ICD-10-CM | POA: Diagnosis not present

## 2023-07-09 DIAGNOSIS — N186 End stage renal disease: Secondary | ICD-10-CM | POA: Insufficient documentation

## 2023-07-09 DIAGNOSIS — Z860101 Personal history of adenomatous and serrated colon polyps: Secondary | ICD-10-CM | POA: Insufficient documentation

## 2023-07-09 DIAGNOSIS — E785 Hyperlipidemia, unspecified: Secondary | ICD-10-CM | POA: Insufficient documentation

## 2023-07-09 DIAGNOSIS — I120 Hypertensive chronic kidney disease with stage 5 chronic kidney disease or end stage renal disease: Secondary | ICD-10-CM | POA: Insufficient documentation

## 2023-07-11 ENCOUNTER — Other Ambulatory Visit: Payer: Self-pay

## 2023-07-11 DIAGNOSIS — C4442 Squamous cell carcinoma of skin of scalp and neck: Secondary | ICD-10-CM

## 2023-07-16 IMAGING — CT CT VIRTUAL COLONOSCOPY DIAGNOSTIC
3 of 9 series · 15 of 46 positions shown, 17 images · non-contrast
Comparison: 11/27/2014

CLINICAL DATA: Incomplete colonoscopy. History of precancerous
polyps

EXAM:
CT VIRTUAL COLONOSCOPY DIAGNOSTIC
TECHNIQUE: The patient was given a standard bowel preparation with Gastrografin
and barium for fluid and stool tagging respectively. The quality of
the bowel preparation is poor. Automated CO2 insufflation of the
colon was performed prior to image acquisition and colonic
distention is moderate. Image post processing was used to generate a
3D endoluminal fly-through projection of the colon and to
electronically subtract stool/fluid as appropriate.

[Series 4: supine colon 1.50 br40 s3 supine thins · axial · 0.86mm/px · z∈[+1269,+1713]mm · 9 of 372 slices shown, 11 images]
[im 38/372  soft-tissue]
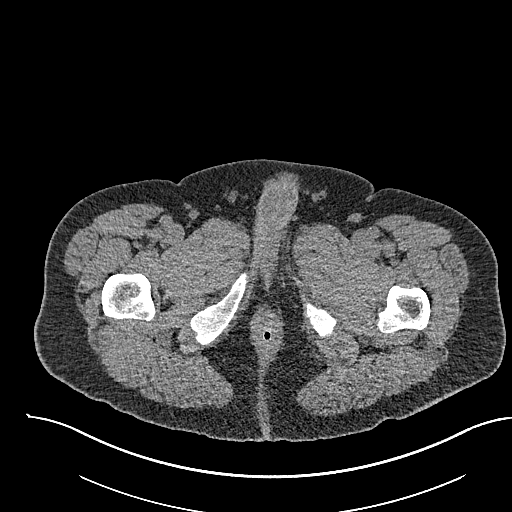
[im 38/372  bone]
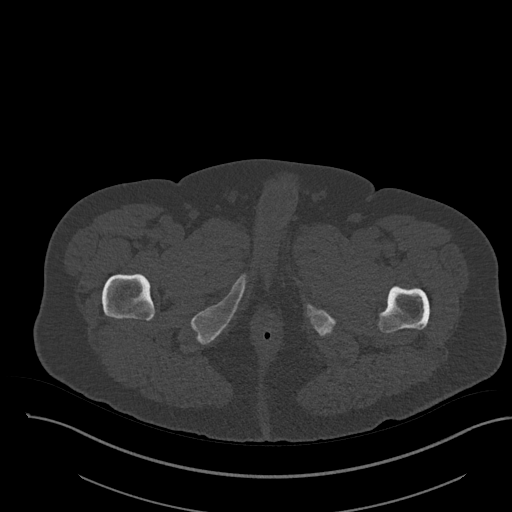
[im 75/372  soft-tissue]
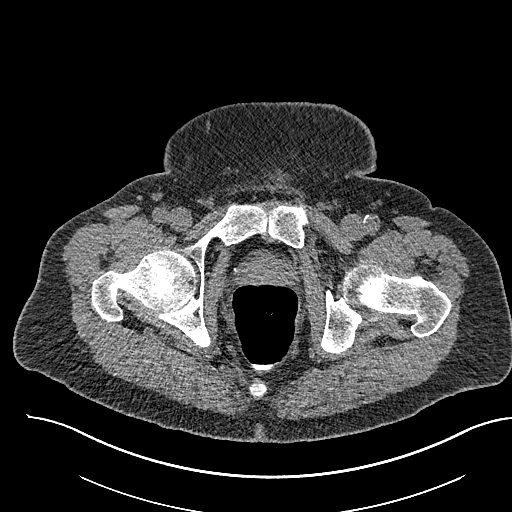
[im 112/372  soft-tissue]
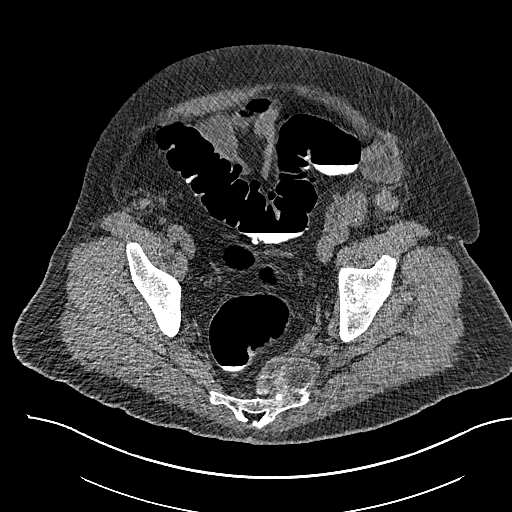
[im 149/372  soft-tissue]
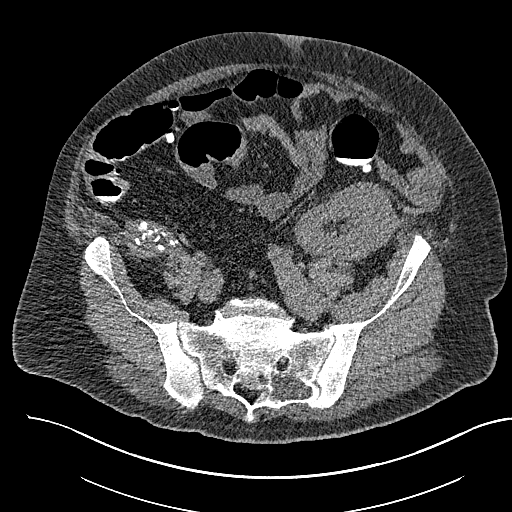
[im 186/372  soft-tissue]
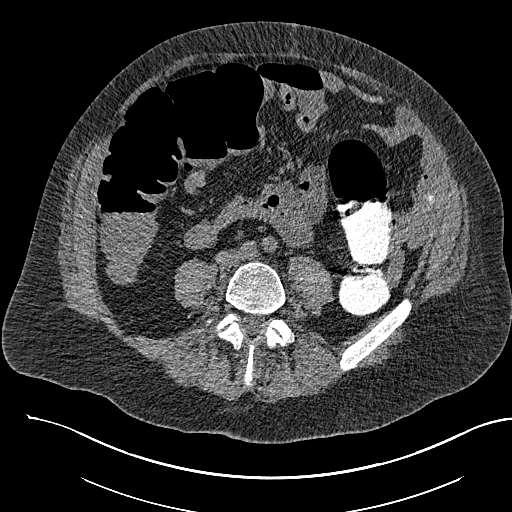
[im 223/372  soft-tissue]
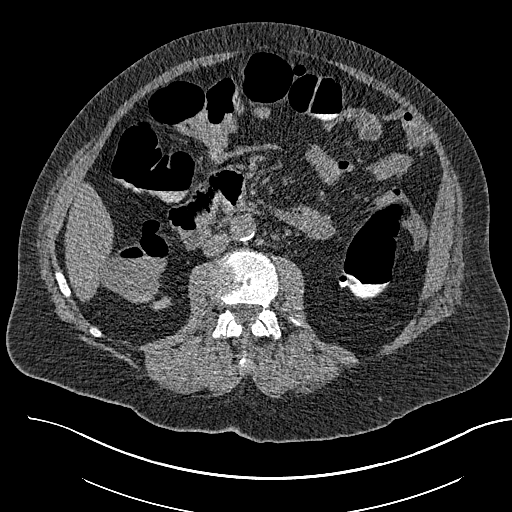
[im 260/372  soft-tissue]
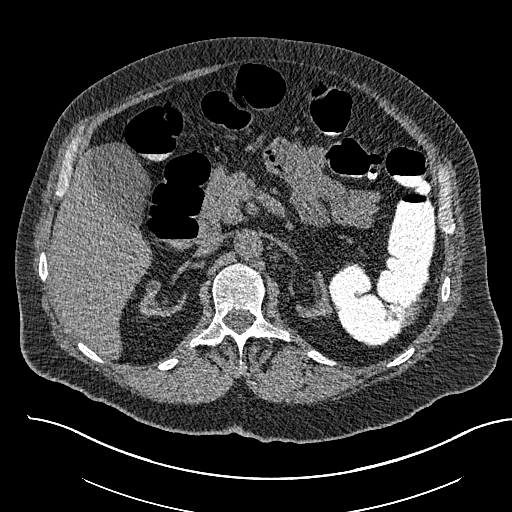
[im 297/372  soft-tissue]
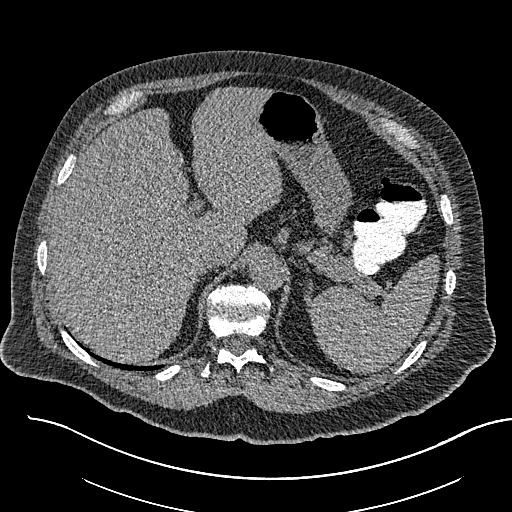
[im 334/372  soft-tissue]
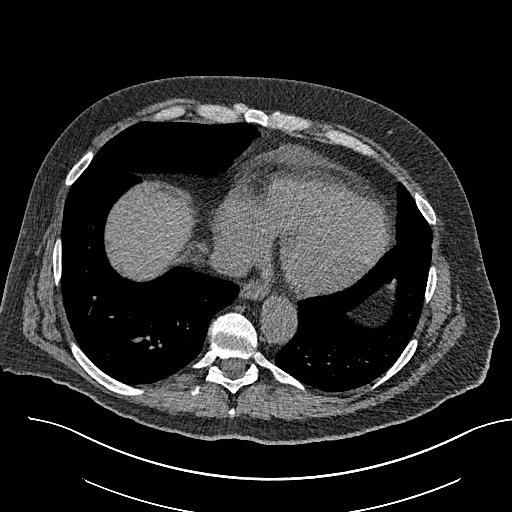
[im 334/372  bone]
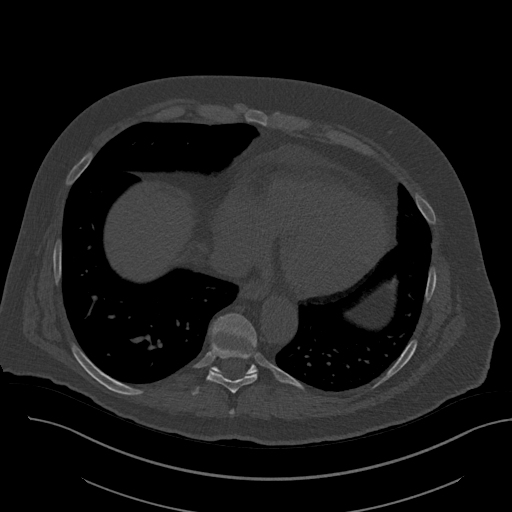

[Series 5: supine colon 3.00 br40 s3 supine 3mm · axial · 0.86mm/px · z∈[+1352,+1628]mm · 3 of 186 slices shown]
[im 47/186  soft-tissue]
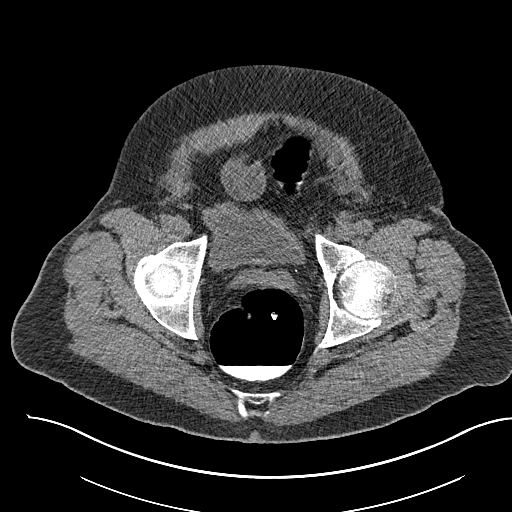
[im 93/186  soft-tissue]
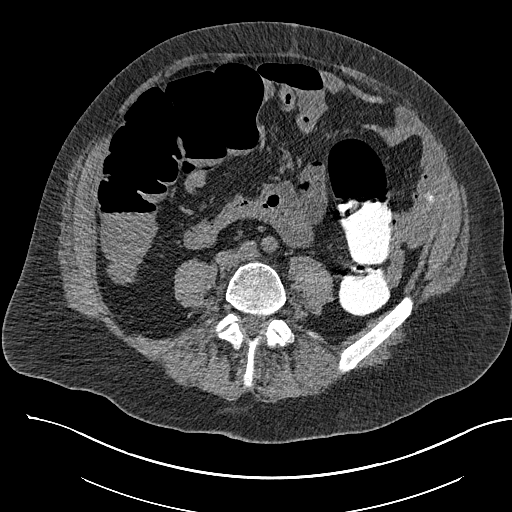
[im 139/186  soft-tissue]
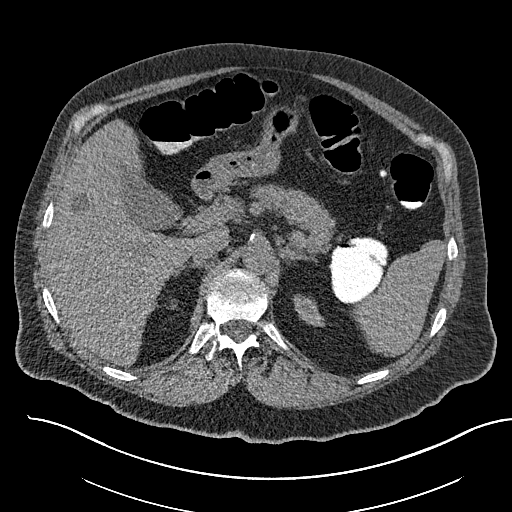

[Series 6: supine colon 3.00 br40 s3 cor supine · coronal · 0.86mm/px · 3 of 146 slices shown]
[im 30/146  soft-tissue]
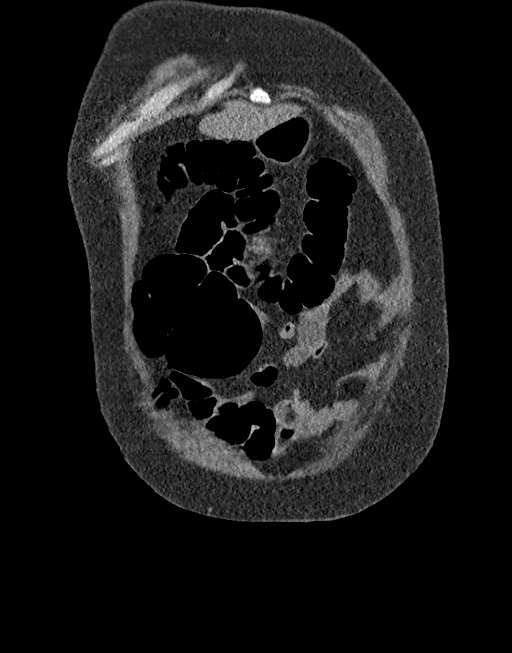
[im 59/146  soft-tissue]
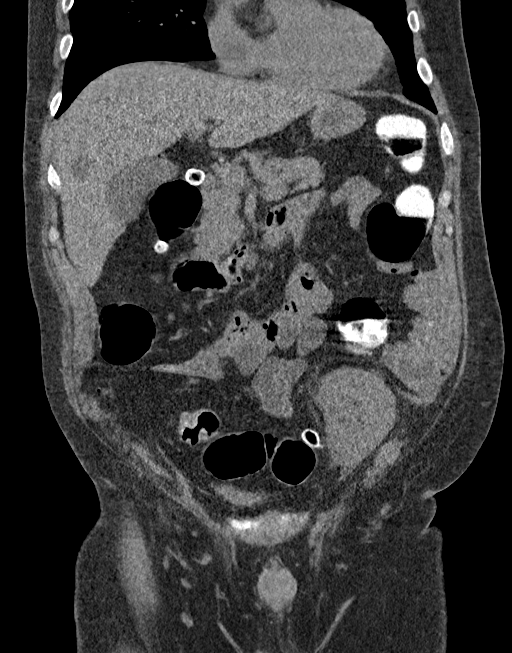
[im 88/146  soft-tissue]
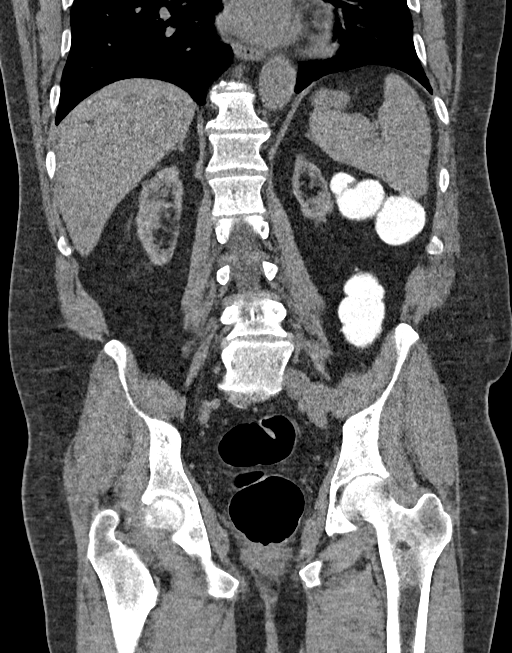

[15 of 46 positions shown; findings below may reference images not displayed]

FINDINGS: VIRTUAL COLONOSCOPY

Large amount of retained barium throughout the colon including full
column stasis throughout several segments. Under distention of the
sigmoid colon which limits evaluation. Diffuse colonic
diverticulosis. No fixed polypoid filling defects or annular
constricting lesions in the distended portions of the colon.

Virtual colonoscopy is not designed to detect diminutive polyps
(i.e., less than or equal to 5 mm), the presence or absence of which
may not affect clinical management.

CT ABDOMEN AND PELVIS WITHOUT CONTRAST

Lower chest: No acute abnormality

Hepatobiliary: Small cyst in the right hepatic lobe is stable since
prior study. No suspicious hepatic abnormality. Gallbladder
unremarkable.

Pancreas: No focal abnormality or ductal dilatation.

Spleen: No focal abnormality.  Normal size.

Adrenals/Urinary Tract: Native kidneys are atrophic. Right lower
quadrant renal transplant is calcified. New left lower quadrant
renal transplant is noted. No hydronephrosis. Urinary bladder
decompressed. Adrenal glands unremarkable.

Stomach/Bowel: Stomach and small bowel decompressed.

Vascular/Lymphatic: Aortic atherosclerosis. No evidence of aneurysm
or adenopathy.

Reproductive: No visible focal abnormality.

Other: No free fluid or free air. Midline ventral hernias containing
fat again noted, unchanged.

Musculoskeletal: No acute bony abnormality.
IMPRESSION: Large amount of retained barium throughout the colon. Under
distention of the sigmoid colon limits evaluation. No visible fixed
polyp, mass or stricture.

Diffuse colonic diverticulosis.

Severely atrophic native kidneys. Right lower quadrant renal
transplant is severely calcified. Interval left lower quadrant renal
transplant grossly unremarkable.

Aortic atherosclerosis.

Stable midline ventral hernias containing fat.

## 2023-07-18 ENCOUNTER — Encounter: Payer: Self-pay | Admitting: Radiation Oncology

## 2023-07-18 NOTE — Progress Notes (Signed)
 When I saw the patient for reconsultation I had discussed with him that a CT scan of the head as well as the neck was recommended to rule out nodal metastases in the scalp as well as the soft tissues of the neck.  These studies are medically necessary as he has very high risk recurrent skin cancer which can metastasize to lymph nodes.  If any lymph nodes are involved with this would change his treatment plan significantly.  My understanding is that his insurance company has still not approved the CT scan of his soft tissue neck with contrast.  We are currently working on getting approval for this medically necessary study. -----------------------------------  Lauraine Golden, MD

## 2023-07-21 ENCOUNTER — Ambulatory Visit (HOSPITAL_COMMUNITY): Payer: Managed Care, Other (non HMO)

## 2023-07-23 ENCOUNTER — Ambulatory Visit: Payer: Managed Care, Other (non HMO) | Admitting: Radiation Oncology

## 2023-07-28 ENCOUNTER — Ambulatory Visit (HOSPITAL_COMMUNITY)
Admission: RE | Admit: 2023-07-28 | Discharge: 2023-07-28 | Disposition: A | Discharge status: Home / Self Care | Payer: Managed Care, Other (non HMO) | Source: Ambulatory Visit | Attending: Radiation Oncology | Admitting: Radiation Oncology

## 2023-07-28 DIAGNOSIS — C4442 Squamous cell carcinoma of skin of scalp and neck: Secondary | ICD-10-CM | POA: Insufficient documentation

## 2023-07-28 MED ORDER — IOHEXOL 300 MG/ML  SOLN
75.0000 mL | Freq: Once | INTRAMUSCULAR | Status: AC | PRN
  Administered 2023-07-28: 75 mL via INTRAVENOUS

## 2023-07-30 ENCOUNTER — Ambulatory Visit: Payer: Managed Care, Other (non HMO) | Admitting: Radiation Oncology

## 2023-07-31 ENCOUNTER — Ambulatory Visit: Payer: Managed Care, Other (non HMO)

## 2023-08-01 ENCOUNTER — Ambulatory Visit: Payer: Managed Care, Other (non HMO)

## 2023-08-01 ENCOUNTER — Ambulatory Visit
Admission: RE | Admit: 2023-08-01 | Discharge: 2023-08-01 | Disposition: A | Discharge status: Home / Self Care | Payer: Managed Care, Other (non HMO) | Source: Ambulatory Visit | Attending: Radiation Oncology | Admitting: Radiation Oncology

## 2023-08-01 DIAGNOSIS — C4442 Squamous cell carcinoma of skin of scalp and neck: Secondary | ICD-10-CM | POA: Diagnosis present

## 2023-08-01 DIAGNOSIS — Z510 Encounter for antineoplastic radiation therapy: Secondary | ICD-10-CM | POA: Diagnosis present

## 2023-08-04 ENCOUNTER — Ambulatory Visit: Payer: Managed Care, Other (non HMO)

## 2023-08-04 DIAGNOSIS — Z510 Encounter for antineoplastic radiation therapy: Secondary | ICD-10-CM | POA: Diagnosis not present

## 2023-08-05 ENCOUNTER — Ambulatory Visit: Payer: Managed Care, Other (non HMO)

## 2023-08-06 ENCOUNTER — Other Ambulatory Visit: Payer: Self-pay

## 2023-08-06 ENCOUNTER — Ambulatory Visit: Payer: Managed Care, Other (non HMO)

## 2023-08-06 ENCOUNTER — Ambulatory Visit
Admission: RE | Admit: 2023-08-06 | Discharge: 2023-08-06 | Disposition: A | Discharge status: Home / Self Care | Payer: Managed Care, Other (non HMO) | Source: Ambulatory Visit | Attending: Radiation Oncology | Admitting: Radiation Oncology

## 2023-08-06 DIAGNOSIS — Z510 Encounter for antineoplastic radiation therapy: Secondary | ICD-10-CM | POA: Diagnosis not present

## 2023-08-06 LAB — RAD ONC ARIA SESSION SUMMARY
Course Elapsed Days: 0
Plan Fractions Treated to Date: 1
Plan Prescribed Dose Per Fraction: 2.4 Gy
Plan Total Fractions Prescribed: 20
Plan Total Prescribed Dose: 48 Gy
Reference Point Dosage Given to Date: 2.4 Gy
Reference Point Session Dosage Given: 2.4 Gy
Session Number: 1

## 2023-08-07 ENCOUNTER — Other Ambulatory Visit: Payer: Self-pay

## 2023-08-07 ENCOUNTER — Ambulatory Visit: Payer: Managed Care, Other (non HMO)

## 2023-08-07 ENCOUNTER — Ambulatory Visit
Admission: RE | Admit: 2023-08-07 | Discharge: 2023-08-07 | Disposition: A | Discharge status: Home / Self Care | Payer: Managed Care, Other (non HMO) | Source: Ambulatory Visit | Attending: Radiation Oncology | Admitting: Radiation Oncology

## 2023-08-07 DIAGNOSIS — Z510 Encounter for antineoplastic radiation therapy: Secondary | ICD-10-CM | POA: Diagnosis not present

## 2023-08-07 LAB — RAD ONC ARIA SESSION SUMMARY
Course Elapsed Days: 1
Plan Fractions Treated to Date: 2
Plan Prescribed Dose Per Fraction: 2.4 Gy
Plan Total Fractions Prescribed: 20
Plan Total Prescribed Dose: 48 Gy
Reference Point Dosage Given to Date: 4.8 Gy
Reference Point Session Dosage Given: 2.4 Gy
Session Number: 2

## 2023-08-08 ENCOUNTER — Other Ambulatory Visit: Payer: Self-pay

## 2023-08-08 ENCOUNTER — Ambulatory Visit
Admission: RE | Admit: 2023-08-08 | Discharge: 2023-08-08 | Disposition: A | Discharge status: Home / Self Care | Payer: Managed Care, Other (non HMO) | Source: Ambulatory Visit | Attending: Radiation Oncology | Admitting: Radiation Oncology

## 2023-08-08 ENCOUNTER — Ambulatory Visit: Payer: Managed Care, Other (non HMO)

## 2023-08-08 DIAGNOSIS — Z510 Encounter for antineoplastic radiation therapy: Secondary | ICD-10-CM | POA: Diagnosis not present

## 2023-08-08 LAB — RAD ONC ARIA SESSION SUMMARY
Course Elapsed Days: 2
Plan Fractions Treated to Date: 3
Plan Prescribed Dose Per Fraction: 2.4 Gy
Plan Total Fractions Prescribed: 20
Plan Total Prescribed Dose: 48 Gy
Reference Point Dosage Given to Date: 7.2 Gy
Reference Point Session Dosage Given: 2.4 Gy
Session Number: 3

## 2023-08-11 ENCOUNTER — Ambulatory Visit
Admission: RE | Admit: 2023-08-11 | Discharge: 2023-08-11 | Disposition: A | Discharge status: Home / Self Care | Payer: Managed Care, Other (non HMO) | Source: Ambulatory Visit | Attending: Radiation Oncology | Admitting: Radiation Oncology

## 2023-08-11 ENCOUNTER — Other Ambulatory Visit: Payer: Self-pay

## 2023-08-11 ENCOUNTER — Ambulatory Visit: Payer: Managed Care, Other (non HMO)

## 2023-08-11 DIAGNOSIS — Z510 Encounter for antineoplastic radiation therapy: Secondary | ICD-10-CM | POA: Diagnosis present

## 2023-08-11 DIAGNOSIS — C4442 Squamous cell carcinoma of skin of scalp and neck: Secondary | ICD-10-CM | POA: Diagnosis present

## 2023-08-11 LAB — RAD ONC ARIA SESSION SUMMARY
Course Elapsed Days: 5
Plan Fractions Treated to Date: 4
Plan Prescribed Dose Per Fraction: 2.4 Gy
Plan Total Fractions Prescribed: 20
Plan Total Prescribed Dose: 48 Gy
Reference Point Dosage Given to Date: 9.6 Gy
Reference Point Session Dosage Given: 2.4 Gy
Session Number: 4

## 2023-08-12 ENCOUNTER — Other Ambulatory Visit: Payer: Self-pay

## 2023-08-12 ENCOUNTER — Ambulatory Visit
Admission: RE | Admit: 2023-08-12 | Discharge: 2023-08-12 | Disposition: A | Discharge status: Home / Self Care | Payer: Managed Care, Other (non HMO) | Source: Ambulatory Visit | Attending: Radiation Oncology

## 2023-08-12 ENCOUNTER — Ambulatory Visit: Payer: Managed Care, Other (non HMO)

## 2023-08-12 DIAGNOSIS — Z510 Encounter for antineoplastic radiation therapy: Secondary | ICD-10-CM | POA: Diagnosis not present

## 2023-08-12 LAB — RAD ONC ARIA SESSION SUMMARY
Course Elapsed Days: 6
Plan Fractions Treated to Date: 5
Plan Prescribed Dose Per Fraction: 2.4 Gy
Plan Total Fractions Prescribed: 20
Plan Total Prescribed Dose: 48 Gy
Reference Point Dosage Given to Date: 12 Gy
Reference Point Session Dosage Given: 2.4 Gy
Session Number: 5

## 2023-08-13 ENCOUNTER — Ambulatory Visit: Payer: Managed Care, Other (non HMO)

## 2023-08-13 ENCOUNTER — Other Ambulatory Visit: Payer: Self-pay

## 2023-08-13 ENCOUNTER — Encounter (INDEPENDENT_AMBULATORY_CARE_PROVIDER_SITE_OTHER): Payer: Self-pay

## 2023-08-13 DIAGNOSIS — Z510 Encounter for antineoplastic radiation therapy: Secondary | ICD-10-CM | POA: Diagnosis not present

## 2023-08-13 LAB — RAD ONC ARIA SESSION SUMMARY
Course Elapsed Days: 7
Plan Fractions Treated to Date: 6
Plan Prescribed Dose Per Fraction: 2.4 Gy
Plan Total Fractions Prescribed: 20
Plan Total Prescribed Dose: 48 Gy
Reference Point Dosage Given to Date: 14.4 Gy
Reference Point Session Dosage Given: 2.4 Gy
Session Number: 6

## 2023-08-14 ENCOUNTER — Ambulatory Visit: Payer: Managed Care, Other (non HMO)

## 2023-08-14 ENCOUNTER — Other Ambulatory Visit: Payer: Self-pay

## 2023-08-14 DIAGNOSIS — Z510 Encounter for antineoplastic radiation therapy: Secondary | ICD-10-CM | POA: Diagnosis not present

## 2023-08-14 LAB — RAD ONC ARIA SESSION SUMMARY
Course Elapsed Days: 8
Plan Fractions Treated to Date: 7
Plan Prescribed Dose Per Fraction: 2.4 Gy
Plan Total Fractions Prescribed: 20
Plan Total Prescribed Dose: 48 Gy
Reference Point Dosage Given to Date: 16.8 Gy
Reference Point Session Dosage Given: 2.4 Gy
Session Number: 7

## 2023-08-15 ENCOUNTER — Ambulatory Visit: Payer: Managed Care, Other (non HMO)

## 2023-08-15 ENCOUNTER — Other Ambulatory Visit: Payer: Self-pay

## 2023-08-15 ENCOUNTER — Ambulatory Visit
Admission: RE | Admit: 2023-08-15 | Discharge: 2023-08-15 | Disposition: A | Discharge status: Home / Self Care | Source: Ambulatory Visit | Attending: Radiation Oncology | Admitting: Radiation Oncology

## 2023-08-15 DIAGNOSIS — Z510 Encounter for antineoplastic radiation therapy: Secondary | ICD-10-CM | POA: Diagnosis not present

## 2023-08-15 LAB — RAD ONC ARIA SESSION SUMMARY
Course Elapsed Days: 9
Plan Fractions Treated to Date: 8
Plan Prescribed Dose Per Fraction: 2.4 Gy
Plan Total Fractions Prescribed: 20
Plan Total Prescribed Dose: 48 Gy
Reference Point Dosage Given to Date: 19.2 Gy
Reference Point Session Dosage Given: 2.4 Gy
Session Number: 8

## 2023-08-18 ENCOUNTER — Ambulatory Visit: Payer: Managed Care, Other (non HMO)

## 2023-08-18 ENCOUNTER — Other Ambulatory Visit: Payer: Self-pay

## 2023-08-18 DIAGNOSIS — Z510 Encounter for antineoplastic radiation therapy: Secondary | ICD-10-CM | POA: Diagnosis not present

## 2023-08-18 LAB — RAD ONC ARIA SESSION SUMMARY
Course Elapsed Days: 12
Plan Fractions Treated to Date: 9
Plan Prescribed Dose Per Fraction: 2.4 Gy
Plan Total Fractions Prescribed: 20
Plan Total Prescribed Dose: 48 Gy
Reference Point Dosage Given to Date: 21.6 Gy
Reference Point Session Dosage Given: 2.4 Gy
Session Number: 9

## 2023-08-19 ENCOUNTER — Other Ambulatory Visit: Payer: Self-pay

## 2023-08-19 ENCOUNTER — Ambulatory Visit

## 2023-08-19 ENCOUNTER — Ambulatory Visit: Payer: Managed Care, Other (non HMO)

## 2023-08-19 DIAGNOSIS — Z510 Encounter for antineoplastic radiation therapy: Secondary | ICD-10-CM | POA: Diagnosis not present

## 2023-08-19 LAB — RAD ONC ARIA SESSION SUMMARY
Course Elapsed Days: 13
Plan Fractions Treated to Date: 10
Plan Prescribed Dose Per Fraction: 2.4 Gy
Plan Total Fractions Prescribed: 20
Plan Total Prescribed Dose: 48 Gy
Reference Point Dosage Given to Date: 24 Gy
Reference Point Session Dosage Given: 2.4 Gy
Session Number: 10

## 2023-08-20 ENCOUNTER — Ambulatory Visit
Admission: RE | Admit: 2023-08-20 | Discharge: 2023-08-20 | Disposition: A | Discharge status: Home / Self Care | Source: Ambulatory Visit | Attending: Radiation Oncology | Admitting: Radiation Oncology

## 2023-08-20 ENCOUNTER — Ambulatory Visit: Payer: Managed Care, Other (non HMO)

## 2023-08-20 ENCOUNTER — Other Ambulatory Visit: Payer: Self-pay

## 2023-08-20 DIAGNOSIS — Z510 Encounter for antineoplastic radiation therapy: Secondary | ICD-10-CM | POA: Diagnosis not present

## 2023-08-20 LAB — RAD ONC ARIA SESSION SUMMARY
Course Elapsed Days: 14
Plan Fractions Treated to Date: 11
Plan Prescribed Dose Per Fraction: 2.4 Gy
Plan Total Fractions Prescribed: 20
Plan Total Prescribed Dose: 48 Gy
Reference Point Dosage Given to Date: 26.4 Gy
Reference Point Session Dosage Given: 2.4 Gy
Session Number: 11

## 2023-08-21 ENCOUNTER — Ambulatory Visit
Admission: RE | Admit: 2023-08-21 | Discharge: 2023-08-21 | Disposition: A | Discharge status: Home / Self Care | Source: Ambulatory Visit | Attending: Radiation Oncology | Admitting: Radiation Oncology

## 2023-08-21 ENCOUNTER — Other Ambulatory Visit: Payer: Self-pay

## 2023-08-21 ENCOUNTER — Ambulatory Visit: Payer: Managed Care, Other (non HMO)

## 2023-08-21 DIAGNOSIS — Z510 Encounter for antineoplastic radiation therapy: Secondary | ICD-10-CM | POA: Diagnosis not present

## 2023-08-21 LAB — RAD ONC ARIA SESSION SUMMARY
Course Elapsed Days: 15
Plan Fractions Treated to Date: 12
Plan Prescribed Dose Per Fraction: 2.4 Gy
Plan Total Fractions Prescribed: 20
Plan Total Prescribed Dose: 48 Gy
Reference Point Dosage Given to Date: 28.8 Gy
Reference Point Session Dosage Given: 2.4 Gy
Session Number: 12

## 2023-08-22 ENCOUNTER — Ambulatory Visit
Admission: RE | Admit: 2023-08-22 | Discharge: 2023-08-22 | Disposition: A | Discharge status: Home / Self Care | Source: Ambulatory Visit | Attending: Radiation Oncology | Admitting: Radiation Oncology

## 2023-08-22 ENCOUNTER — Ambulatory Visit: Payer: Managed Care, Other (non HMO)

## 2023-08-22 ENCOUNTER — Other Ambulatory Visit: Payer: Self-pay

## 2023-08-22 DIAGNOSIS — Z510 Encounter for antineoplastic radiation therapy: Secondary | ICD-10-CM | POA: Diagnosis not present

## 2023-08-22 LAB — RAD ONC ARIA SESSION SUMMARY
Course Elapsed Days: 16
Plan Fractions Treated to Date: 13
Plan Prescribed Dose Per Fraction: 2.4 Gy
Plan Total Fractions Prescribed: 20
Plan Total Prescribed Dose: 48 Gy
Reference Point Dosage Given to Date: 31.2 Gy
Reference Point Session Dosage Given: 2.4 Gy
Session Number: 13

## 2023-08-25 ENCOUNTER — Ambulatory Visit

## 2023-08-25 ENCOUNTER — Ambulatory Visit: Payer: Managed Care, Other (non HMO)

## 2023-08-25 ENCOUNTER — Ambulatory Visit
Admission: RE | Admit: 2023-08-25 | Discharge: 2023-08-25 | Disposition: A | Discharge status: Home / Self Care | Source: Ambulatory Visit | Attending: Radiation Oncology | Admitting: Radiation Oncology

## 2023-08-25 ENCOUNTER — Other Ambulatory Visit: Payer: Self-pay

## 2023-08-25 DIAGNOSIS — Z510 Encounter for antineoplastic radiation therapy: Secondary | ICD-10-CM | POA: Diagnosis not present

## 2023-08-25 LAB — RAD ONC ARIA SESSION SUMMARY
Course Elapsed Days: 19
Plan Fractions Treated to Date: 14
Plan Prescribed Dose Per Fraction: 2.4 Gy
Plan Total Fractions Prescribed: 20
Plan Total Prescribed Dose: 48 Gy
Reference Point Dosage Given to Date: 33.6 Gy
Reference Point Session Dosage Given: 2.4 Gy
Session Number: 14

## 2023-08-26 ENCOUNTER — Ambulatory Visit: Payer: Managed Care, Other (non HMO)

## 2023-08-26 ENCOUNTER — Other Ambulatory Visit: Payer: Self-pay

## 2023-08-26 ENCOUNTER — Ambulatory Visit
Admission: RE | Admit: 2023-08-26 | Discharge: 2023-08-26 | Disposition: A | Discharge status: Home / Self Care | Source: Ambulatory Visit | Attending: Radiation Oncology

## 2023-08-26 DIAGNOSIS — Z510 Encounter for antineoplastic radiation therapy: Secondary | ICD-10-CM | POA: Diagnosis not present

## 2023-08-26 LAB — RAD ONC ARIA SESSION SUMMARY
Course Elapsed Days: 20
Plan Fractions Treated to Date: 15
Plan Prescribed Dose Per Fraction: 2.4 Gy
Plan Total Fractions Prescribed: 20
Plan Total Prescribed Dose: 48 Gy
Reference Point Dosage Given to Date: 36 Gy
Reference Point Session Dosage Given: 2.4 Gy
Session Number: 15

## 2023-08-27 ENCOUNTER — Ambulatory Visit
Admission: RE | Admit: 2023-08-27 | Discharge: 2023-08-27 | Disposition: A | Discharge status: Home / Self Care | Payer: Managed Care, Other (non HMO) | Source: Ambulatory Visit | Attending: Radiation Oncology

## 2023-08-27 ENCOUNTER — Other Ambulatory Visit: Payer: Self-pay

## 2023-08-27 DIAGNOSIS — Z510 Encounter for antineoplastic radiation therapy: Secondary | ICD-10-CM | POA: Diagnosis not present

## 2023-08-27 LAB — RAD ONC ARIA SESSION SUMMARY
Course Elapsed Days: 21
Plan Fractions Treated to Date: 16
Plan Prescribed Dose Per Fraction: 2.4 Gy
Plan Total Fractions Prescribed: 20
Plan Total Prescribed Dose: 48 Gy
Reference Point Dosage Given to Date: 38.4 Gy
Reference Point Session Dosage Given: 2.4 Gy
Session Number: 16

## 2023-08-28 ENCOUNTER — Ambulatory Visit
Admission: RE | Admit: 2023-08-28 | Discharge: 2023-08-28 | Disposition: A | Discharge status: Home / Self Care | Payer: Managed Care, Other (non HMO) | Source: Ambulatory Visit | Attending: Radiation Oncology | Admitting: Radiation Oncology

## 2023-08-28 ENCOUNTER — Other Ambulatory Visit: Payer: Self-pay

## 2023-08-28 DIAGNOSIS — Z510 Encounter for antineoplastic radiation therapy: Secondary | ICD-10-CM | POA: Diagnosis not present

## 2023-08-28 LAB — RAD ONC ARIA SESSION SUMMARY
Course Elapsed Days: 22
Plan Fractions Treated to Date: 17
Plan Prescribed Dose Per Fraction: 2.4 Gy
Plan Total Fractions Prescribed: 20
Plan Total Prescribed Dose: 48 Gy
Reference Point Dosage Given to Date: 40.8 Gy
Reference Point Session Dosage Given: 2.4 Gy
Session Number: 17

## 2023-08-29 ENCOUNTER — Other Ambulatory Visit: Payer: Self-pay

## 2023-08-29 ENCOUNTER — Ambulatory Visit
Admission: RE | Admit: 2023-08-29 | Discharge: 2023-08-29 | Disposition: A | Discharge status: Home / Self Care | Payer: Managed Care, Other (non HMO) | Source: Ambulatory Visit | Attending: Radiation Oncology | Admitting: Radiation Oncology

## 2023-08-29 DIAGNOSIS — Z510 Encounter for antineoplastic radiation therapy: Secondary | ICD-10-CM | POA: Diagnosis not present

## 2023-08-29 LAB — RAD ONC ARIA SESSION SUMMARY
Course Elapsed Days: 23
Plan Fractions Treated to Date: 18
Plan Prescribed Dose Per Fraction: 2.4 Gy
Plan Total Fractions Prescribed: 20
Plan Total Prescribed Dose: 48 Gy
Reference Point Dosage Given to Date: 43.2 Gy
Reference Point Session Dosage Given: 2.4 Gy
Session Number: 18

## 2023-09-01 ENCOUNTER — Ambulatory Visit

## 2023-09-01 ENCOUNTER — Other Ambulatory Visit: Payer: Self-pay

## 2023-09-01 ENCOUNTER — Ambulatory Visit
Admission: RE | Admit: 2023-09-01 | Discharge: 2023-09-01 | Disposition: A | Discharge status: Home / Self Care | Payer: Managed Care, Other (non HMO) | Source: Ambulatory Visit | Attending: Radiation Oncology | Admitting: Radiation Oncology

## 2023-09-01 DIAGNOSIS — Z510 Encounter for antineoplastic radiation therapy: Secondary | ICD-10-CM | POA: Diagnosis not present

## 2023-09-01 LAB — RAD ONC ARIA SESSION SUMMARY
Course Elapsed Days: 26
Plan Fractions Treated to Date: 19
Plan Prescribed Dose Per Fraction: 2.4 Gy
Plan Total Fractions Prescribed: 20
Plan Total Prescribed Dose: 48 Gy
Reference Point Dosage Given to Date: 45.6 Gy
Reference Point Session Dosage Given: 2.4 Gy
Session Number: 19

## 2023-09-02 ENCOUNTER — Other Ambulatory Visit: Payer: Self-pay

## 2023-09-02 ENCOUNTER — Ambulatory Visit
Admission: RE | Admit: 2023-09-02 | Discharge: 2023-09-02 | Disposition: A | Discharge status: Home / Self Care | Payer: Managed Care, Other (non HMO) | Source: Ambulatory Visit | Attending: Radiation Oncology

## 2023-09-02 DIAGNOSIS — Z510 Encounter for antineoplastic radiation therapy: Secondary | ICD-10-CM | POA: Diagnosis not present

## 2023-09-02 LAB — RAD ONC ARIA SESSION SUMMARY
Course Elapsed Days: 27
Plan Fractions Treated to Date: 20
Plan Prescribed Dose Per Fraction: 2.4 Gy
Plan Total Fractions Prescribed: 20
Plan Total Prescribed Dose: 48 Gy
Reference Point Dosage Given to Date: 48 Gy
Reference Point Session Dosage Given: 2.4 Gy
Session Number: 20

## 2023-09-03 NOTE — Radiation Completion Notes (Signed)
 Patient Name: Mario Woods, Mario Woods MRN: [id number] Date of Birth: 07-31-1960 Referring Physician: Annitta Needs, M.D. Date of Service: 2023-09-03 Radiation Oncologist: Lonie Peak, M.D. [facility]                             RADIATION ONCOLOGY END OF TREATMENT NOTE     Diagnosis: C44.42 Squamous cell carcinoma of skin of scalp and neck Staging on 2022-08-14: Squamous cell carcinoma of scalp T=pT3, N=pN0, M=cM0 Intent: Curative     ==========DELIVERED PLANS==========  First Treatment Date: 2023-08-06 Last Treatment Date: 2023-09-02   Plan Name: [id number] Site: Scalp Technique: IMRT Mode: Photon Dose Per Fraction: 2.4 Gy Prescribed Dose (Delivered / Prescribed): 48 Gy / 48 Gy Prescribed Fxs (Delivered / Prescribed): 20 / 20     ==========ON TREATMENT VISIT DATES========== 2023-08-11, 2023-08-15, 2023-08-19, 2023-08-25, 2023-09-01     ==========UPCOMING VISITS========== 10/01/2023 [facility]-RADIATION ONC FOLLOW UP 20 Lonie Peak, MD        ==========APPENDIX - ON TREATMENT VISIT NOTES==========   See weekly On Treatment Notes in Epic for details in the Media tab (listed as Progress notes on the On Treatment Visit Dates listed above).

## 2023-09-24 NOTE — Progress Notes (Signed)
 Mr. Mario Woods presents to the clinic for a one month follow up. He completed radiation therapy for Squamous cell carcinoma of the skin of scalp and neck on 09/02/2023.  Patient is doing well, does have some scalp itching.  Pain issues, if any: Patient denies having any pain  Using a feeding tube?: Denies Weight changes, if any: Weight has stayed the same Swallowing issues, if any: Denies  Smoking or chewing tobacco? Denies  Using fluoride toothpaste daily? Yes Last ENT visit was on: End of April Other notable issues, if any: None  BP 127/87 (BP Location: Right Arm, Patient Position: Sitting, Cuff Size: Normal)   Pulse 87   Temp 97.7 F (36.5 C)   Resp 18   Ht 6\' 5"  (1.956 m)   Wt 291 lb 9.6 oz (132.3 kg)   SpO2 100%   BMI 34.58 kg/m     Wt Readings from Last 3 Encounters:  10/01/23 291 lb 9.6 oz (132.3 kg)  07/09/23 292 lb 3.2 oz (132.5 kg)  12/13/22 291 lb (132 kg)

## 2023-10-01 ENCOUNTER — Ambulatory Visit
Admission: RE | Admit: 2023-10-01 | Discharge: 2023-10-01 | Disposition: A | Discharge status: Home / Self Care | Source: Ambulatory Visit | Attending: Radiation Oncology | Admitting: Radiation Oncology

## 2023-10-01 VITALS — BP 127/87 | HR 87 | Temp 97.7°F | Resp 18 | Ht 77.0 in | Wt 291.6 lb

## 2023-10-01 DIAGNOSIS — C4442 Squamous cell carcinoma of skin of scalp and neck: Secondary | ICD-10-CM

## 2023-10-01 NOTE — Progress Notes (Signed)
 Radiation Oncology         [phone]) [phone] ________________________________  Name: Mario Woods. MRN: [id number]  Date: 10/01/2023  DOB: 20-Jan-1961  Follow-Up Visit Note  Outpatient  CC: Charley Constable, MD  Charley Constable, MD  Diagnosis and Prior Radiotherapy:    ICD-10-CM   1. Squamous cell carcinoma of scalp  C44.42     PREVIOUS RADIATION THERAPY:  Salvage post op RT for recurrence - new tumor bed treated in addition to adjacent inferior scalp with effort to minimize overlap as much as reasonably possible between current and previous radiation fields. First Treatment Date: 2023-08-06 Last Treatment Date: 2023-09-02 Plan Name: [email or web address]_[email or web address] Site: Scalp Technique: IMRT Mode: Photon Dose Per Fraction: 2.4 Gy Prescribed Dose (Delivered / Prescribed): 48 Gy / 48 Gy Prescribed Fxs (Delivered / Prescribed): 20 / 20   Initial post op RT 09/30/2022-11/14/2022              Site: Scalp Technique: 3D Mode: Photon Dose Per Fraction: 2 Gy Prescribed Dose (Delivered / Prescribed): 60 Gy / 60 Gy Prescribed Fxs (Delivered / Prescribed): 30 / 30    CHIEF COMPLAINT: Here for follow-up and surveillance of scalp skin cancer  Narrative:  The patient returns today for routine follow-up.  Mario Woods presents to the clinic for a one month follow up. He completed radiation therapy for Squamous cell carcinoma of the skin of scalp and neck on 09/02/2023.  Patient is doing well, does have some scalp itching.  Pain issues, if any: Patient denies having any pain  Using a feeding tube?: Denies Weight changes, if any: Weight has stayed the same Swallowing issues, if any: Denies  Smoking or chewing tobacco? Denies  Using fluoride toothpaste daily? Yes Last ENT visit was on: April Other notable issues, if any: None  BP 127/87 (BP Location: Right Arm, Patient Position: Sitting, Cuff Size: Normal)   Pulse 87   Temp 97.7 F (36.5 C)   Resp 18   Ht 6\' 5"  (1.956 m)   Wt 291 lb 9.6 oz  (132.3 kg)   SpO2 100%   BMI 34.58 kg/m     Wt Readings from Last 3 Encounters:  10/01/23 291 lb 9.6 oz (132.3 kg)  07/09/23 292 lb 3.2 oz (132.5 kg)  12/13/22 291 lb (132 kg)                                 ALLERGIES:  is allergic to feraheme [ferumoxytol ].  Meds: Current Outpatient Medications  Medication Sig Dispense Refill   aspirin EC 81 MG tablet Take 81 mg by mouth daily.     calcitRIOL (ROCALTROL) 0.25 MCG capsule Take 0.25 mcg by mouth every Monday, Wednesday, and Friday.     cinacalcet  (SENSIPAR ) 90 MG tablet Take 90 mg by mouth daily.     furosemide (LASIX) 40 MG tablet Take 40 mg by mouth daily.     MAGNESIUM OXIDE PO Take 500 mg by mouth daily.     Multiple Vitamins-Minerals (MULTIVITAMIN WITH MINERALS) tablet Take 1 tablet by mouth daily.     labetalol  (NORMODYNE ) 200 MG tablet Take 400 mg by mouth 3 (three) times daily. (Patient not taking: Reported on 07/09/2023)     predniSONE  (DELTASONE ) 5 MG tablet Take 5 mg by mouth daily. (Patient not taking: Reported on 10/01/2023)     sodium bicarbonate 650 MG tablet Take 1,950 mg by mouth 3 (three) times daily. (Patient not  taking: Reported on 07/09/2023)     sodium zirconium cyclosilicate (LOKELMA) 10 g PACK packet Take 10 g by mouth every other day. (Patient not taking: Reported on 07/09/2023)     tacrolimus  (PROGRAF ) 1 MG capsule Take 2 mg by mouth 2 (two) times daily. (Patient not taking: Reported on 07/09/2023)     No current facility-administered medications for this encounter.    Physical Findings: The patient is in no acute distress. Patient is alert and oriented.  height is 6\' 5"  (1.956 m) and weight is 291 lb 9.6 oz (132.3 kg). His temperature is 97.7 F (36.5 C). His blood pressure is 127/87 and his pulse is 87. His respiration is 18 and oxygen saturation is 100%. .    General exam reveals he is alert and oriented in no acute distress  Skin: skin exam reveals no visible or palpable lesions throughout the  scalp  Neck: No palpable adenopathy throughout the neck   Lab Findings: Lab Results  Component Value Date   WBC 8.5 08/27/2022   HGB 9.7 (L) 08/27/2022   HCT 31.7 (L) 08/27/2022   MCV 101.6 (H) 08/27/2022   PLT 241 08/27/2022    Radiographic Findings: No results found.  Impression/Plan:  This is a pleasant 63 yr old patient salvage post op RT for recurrence of scalp cancer - his new tumor bed was treated in addition to adjacent inferior scalp with effort to minimize overlap as much as reasonably possible between current and previous radiation fields.  He is doing very well without any evidence of recurrent disease in the scalp or neck nodes today.  We will see him back in radiation oncology in 3 months for physical exam.  He will continue imaging as clinically indicated with otolaryngology at [facility] we can certainly arrange imaging here if they prefer.  On date of service, in total, I spent 20 minutes on this encounter. Patient was seen in person.  _____________________________________   Colie Dawes, MD

## 2023-12-24 NOTE — Progress Notes (Addendum)
 Mario Woods returns today for f/u  visit         He is immunosuppressed following two renal transplants 2010 and 2017, has been maintained on prograf  and prednisone .  He has had numerous cutaneous BCC's and SCC's (eg temple, preauricular, arm)     In 2021 he had  resection of a scalp SCCa, had poor healing and tenderness after this.   This has a biopsy-proven recurrence. Scans show a large subcutaneous extension along the pericranium, no obvious bone erosion.                       He underwent WLE of scalp cancer, parotidectomy, selective neck dissection, and latissimus free flap / STSG (Dr Giles) on 07/18/22   This was followed by RT at [facility], completed 11/13/22       By Oct 2024, he noted  a small nodule under the skin in the right posterior scalp.                   This was excised (down to pericranium) on 04/07/23, pathology with SCCa, PNI up to 0.6 mm nerves      After packing this for several months, he began a course of re-irradiation with Dr Izell, completed 09/02/23        He has a spot on the scalp (right forehead)  that he is concerned about.  Noted this several months ago, may be getting larger.  Seems sensitive    No other  new nodules or lesions around  either surgical wound           There have been no new neck/parotid masses or enlarged lymph nodes       Note that the most recent transplanted kidney has failed and he is  back on dialysis.  He is not  on prednisone  or other immunosuppressant      PMH - ESRD s/p two transplants, HLD, HTN  Tobacco - quit 2000 after 5 pack-yrs  Lives in [place]           Examination   . Vitals: Blood pressure 139/79, pulse 91, height 1.93 m (6' 4), weight 123 kg (272 lb), SpO2 100%.   . General appearance of patient: healthy and no distress   . Quality of voice: no hoarseness, no dysarthria   . Inspection of head and face: well healed flap and skin graft,   posterior right scalp wound flat and healthy  appearing, no  exposed bone, surrounding skin and subcutaneous tissues with no    worrisome masses or lesions ; at right anterior aspect of the forehead, there is a small few-mm subcutaneous nodule, mobile, a bit tender.  Biopsy obtained  . Left parotid gland: soft, nontender, no swelling, no masses/lesions  . Right parotid gland:   no masses/lesions  . Facial strength: intact, symmetric    . Neck: soft, supple, no   palpable lymph nodes      .       Procedure: Punch biopsy of right forehead Technique: The tissue surrounding the   lesion was anesthetized with lidocaine  1% with epinephrine  1:100000. A 4-mm punch biopsy was used to incise the skin, and the specimen was then cut from the subcutaneous tissues.  The specimen was placed in formalin and sent to pathology.  Hemostasis was obtained with firm pressure and silver nitrate cautery. The patient tolerated the procedure well.    Procedures  : CPT 11104 Punch biopsy skin,  single lesion           Data reviewed:    MRI BRAIN WITH AND WITHOUT CONTRAST, 12/10/2023 11:49 AM   FINDINGS: Calvarium/skull base: Postsurgical changes of resection and free flap reconstruction of a right parietal scalp squamous cell carcinoma, most recently status post wide excision in the setting of recurrent disease. T1 hypointense signal of the right parietal calvarium (series 14 image 22) with ill-defined enhancement without focal nodular enhancement. Overlying the area of T1 hypointense parietal calvarium is smooth enhancement of the residual scalp (series 17 image 42). Partially imaged changes of superficial parotidectomy.   Orbits: No mass lesion.   Paranasal sinuses: No air-fluid levels.   Brain: No evidence of acute infarct. No mass effect. Mild smooth diffuse pachymeningeal enhancement. Remote left cerebellar hemispheric lacunar infarct. A few small foci of susceptibility weighted artifact in the left posterior frontal and anterior parietal lobes could  reflect punctate microhemorrhages and are unchanged from prior (series 11 image 81, 85, 89). No evidence of acute hemorrhage. No hydrocephalus. Scattered T2 hyperintense white matter lesions are abnormal but nonspecific, typically attributed to prior trauma/inflammation/demyelination, or chronic ischemia associated with migraine/atherosclerosis. Normal appearance of the major intracranial arteries and dural venous sinuses.   IMPRESSION: Postsurgical changes of resection of a recurrent squamous cell carcinoma along the right parietal scalp. Smooth enhancement of the scalp subadjacent to abnormal T1 hypointense signal along the right parietal calvarium is overall favored postsurgical, without nodular enhancement to specifically suggest recurrent disease. Correlate with direct inspection.           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  Pathology with negative magins, +LVI, no PNI, 0/12 lymph nodes.   He underwent postop RT, completed  June 2024 with Dr Izell    Recurrence noted at right parietal scalp, excised on 04/07/23.  Pathology with +PNI, up to 0.6 mm diameter nerve.  He underwent additonal RT with Dr Izell, completed 08/13/23    This is in the setting of immunocompromise from renal transplant       There is no evidence of disease on exam today at the sites of prior surgery.   He has a small nodule in the right forehead that is not terribly concerning, but seems to be getting bigger to him.  Biopsy obtained today.    I will call with the results of the biopsy when they are available to me: [phone]    He sees Dr Izell later this month   Will plan f/u over the phone         All his questions were answered.        cc:  JAYSON Venancio DELENA Giles GORMAN Izell      The provider time of the today's visit was 20 minutes. This  included time spent face-to-face obtaining history and performing examination, review of office notes from other providers, labs, imaging since our last visit, counseling and educating the patient/family/caregiver, ordering tests/treatments/procedures, communicating with other providers, care coordination, and/or documentation of the visit.    20-29 minutes: [id number]

## 2023-12-30 NOTE — Progress Notes (Signed)
 Mr. Mario Woods presents today for a 3 month follow up. He completed radiation therapy for Squamous Cell Carcinoma of the scalp and neck on 09/02/2023.   Pain issues, if any: Patient denies Using a feeding tube?: Patient denies  Weight changes, if any: Patient has lost about 15 pounds Swallowing issues, if any: Patient denies any issues swallowing or drinking Smoking or chewing tobacco? Patient denies  Using fluoride toothpaste daily? Yes Last ENT visit was on: December 24, 2023 with Dr. Lauralee  Other notable issues, if any:  None  BP (!) 138/94 (BP Location: Right Arm, Patient Position: Sitting, Cuff Size: Large)   Pulse (!) 101   Temp 97.7 F (36.5 C)   Resp 20   Ht 6' 5 (1.956 m)   Wt 274 lb 3.2 oz (124.4 kg)   SpO2 100%   BMI 32.52 kg/m    Wt Readings from Last 3 Encounters:  12/31/23 274 lb 3.2 oz (124.4 kg)  10/01/23 291 lb 9.6 oz (132.3 kg)  07/09/23 292 lb 3.2 oz (132.5 kg)

## 2023-12-31 ENCOUNTER — Ambulatory Visit
Admission: RE | Admit: 2023-12-31 | Discharge: 2023-12-31 | Disposition: A | Discharge status: Home / Self Care | Source: Ambulatory Visit | Attending: Radiation Oncology | Admitting: Radiation Oncology

## 2023-12-31 ENCOUNTER — Telehealth: Payer: Self-pay | Admitting: Radiation Oncology

## 2023-12-31 VITALS — BP 138/94 | HR 101 | Temp 97.7°F | Resp 20 | Ht 77.0 in | Wt 274.2 lb

## 2023-12-31 DIAGNOSIS — Z7952 Long term (current) use of systemic steroids: Secondary | ICD-10-CM | POA: Diagnosis not present

## 2023-12-31 DIAGNOSIS — Z79899 Other long term (current) drug therapy: Secondary | ICD-10-CM | POA: Insufficient documentation

## 2023-12-31 DIAGNOSIS — Z79621 Long term (current) use of calcineurin inhibitor: Secondary | ICD-10-CM | POA: Diagnosis not present

## 2023-12-31 DIAGNOSIS — Z7982 Long term (current) use of aspirin: Secondary | ICD-10-CM | POA: Diagnosis not present

## 2023-12-31 DIAGNOSIS — C4442 Squamous cell carcinoma of skin of scalp and neck: Secondary | ICD-10-CM | POA: Diagnosis present

## 2023-12-31 DIAGNOSIS — Z923 Personal history of irradiation: Secondary | ICD-10-CM | POA: Diagnosis not present

## 2023-12-31 NOTE — Progress Notes (Signed)
 Radiation Oncology         [phone]) [phone] ________________________________  Name: Mario Woods. MRN: [id number]  Date: 12/31/2023  DOB: August 23, 1960  Follow-Up Visit Note  Outpatient  CC: Mario Pac, MD  Mario Pac, MD  Diagnosis and Prior Radiotherapy:    ICD-10-CM   1. Squamous cell carcinoma, scalp/neck  C44.42     2. Squamous cell carcinoma of scalp  C44.42      PREVIOUS RADIATION THERAPY:  Salvage post op RT for recurrence - new tumor bed treated in addition to adjacent inferior scalp with effort to minimize overlap as much as reasonably possible between current and previous radiation fields. First Treatment Date: 2023-08-06 Last Treatment Date: 2023-09-02 Plan Name: [email or web address]_Scalp2 Site: Scalp Technique: IMRT Mode: Photon Dose Per Fraction: 2.4 Gy Prescribed Dose (Delivered / Prescribed): 48 Gy / 48 Gy Prescribed Fxs (Delivered / Prescribed): 20 / 20   Initial post op RT 09/30/2022-11/14/2022              Site: Scalp Technique: 3D Mode: Photon Dose Per Fraction: 2 Gy Prescribed Dose (Delivered / Prescribed): 60 Gy / 60 Gy Prescribed Fxs (Delivered / Prescribed): 30 / 30   CHIEF COMPLAINT: Here for follow-up and surveillance of scalp skin cancer  Narrative:  The patient returns today for routine follow-up. He had a lump in the right forehead that was not highly suspicious on exam when he saw Dr Mario Woods earlier this month but it was biopsied out of caution.  Pathology on 12-24-23 showed INVASIVE SQUAMOUS CELL CARCINOMA.  I personally conferred with Dr. Lauralee.  The lesion was not visible on MRI.  He has referred the patient to have this resected by Mohs                               Otherwise the patient denies any new symptoms.  Photo from ENT:   ALLERGIES:  is allergic to feraheme [ferumoxytol ].  Meds: Current Outpatient Medications  Medication Sig Dispense Refill   aspirin EC 81 MG tablet Take 81 mg by mouth daily.     calcitRIOL  (ROCALTROL) 0.25 MCG capsule Take 0.25 mcg by mouth every Monday, Wednesday, and Friday.     cinacalcet  (SENSIPAR ) 90 MG tablet Take 90 mg by mouth daily.     furosemide (LASIX) 40 MG tablet Take 40 mg by mouth daily.     labetalol  (NORMODYNE ) 200 MG tablet Take 400 mg by mouth 3 (three) times daily. (Patient not taking: Reported on 07/09/2023)     MAGNESIUM OXIDE PO Take 500 mg by mouth daily.     Multiple Vitamins-Minerals (MULTIVITAMIN WITH MINERALS) tablet Take 1 tablet by mouth daily.     predniSONE  (DELTASONE ) 5 MG tablet Take 5 mg by mouth daily. (Patient not taking: Reported on 10/01/2023)     sodium bicarbonate 650 MG tablet Take 1,950 mg by mouth 3 (three) times daily. (Patient not taking: Reported on 07/09/2023)     sodium zirconium cyclosilicate (LOKELMA) 10 g PACK packet Take 10 g by mouth every other day. (Patient not taking: Reported on 07/09/2023)     tacrolimus  (PROGRAF ) 1 MG capsule Take 2 mg by mouth 2 (two) times daily. (Patient not taking: Reported on 07/09/2023)     No current facility-administered medications for this encounter.    Physical Findings: The patient is in no acute distress. Patient is alert and oriented.  height is 6' 5 (1.956 m) and weight  is 274 lb 3.2 oz (124.4 kg). His temperature is 97.7 F (36.5 C). His blood pressure is 138/94 (abnormal) and his pulse is 101 (abnormal). His respiration is 20 and oxygen saturation is 100%. .    General exam reveals he is alert and oriented in no acute distress  Skin: Fairly subtle palpable subcutaneous mass in right forehead/frontal scalp consistent with photograph above.  There is a scab where it was recently biopsied.  No other concerning visible or palpable lesions throughout the scalp.  Neck: No palpable adenopathy throughout the preauricular, postauricular, occipital, cervical, supraclavicular neck   Lab Findings: Lab Results  Component Value Date   WBC 8.5 08/27/2022   HGB 9.7 (L) 08/27/2022   HCT 31.7 (L)  08/27/2022   MCV 101.6 (H) 08/27/2022   PLT 241 08/27/2022    Radiographic Findings: No results found.  Impression/Plan:  This is a pleasant 63 yr old patient s/p post-op RT twice for aggressive scalp cancer in the setting of immunosuppression/renal transplant.    He now presents with a subcutaneous squamous cell carcinoma in the right frontal scalp/right forehead.  This was biopsied and referral has been made for Mohs resection.  No other sites concerning for recurrent or progressive disease at this time on physical exam.  He continues to follow closely with otolaryngology.  He prefers to wait to see radiation oncology for another 6 months.  I advised him to call us  if there is a gap in his follow-up so he can at least see 1 provider every 3 months.  He is pleased with this plan.   On date of service, in total, I spent 20 minutes on this encounter. Patient was seen in person.  _____________________________________   Lauraine Golden, MD

## 2023-12-31 NOTE — Telephone Encounter (Signed)
 Spoke to pt to confirm 40M FU appt as requested by Dr. Izell. Pt agreeable to 1/26@12pm .

## 2024-03-10 NOTE — Progress Notes (Signed)
 Dermatologic Surgery Electrodesiccation and Curettage Procedure Note  Referring Physician:  No referring provider defined for this encounter.   History: Tumor: Basal Cell Carcinoma (Right medial lower leg)  Duration:  0-3 months Previous Treatment: No  Allergies[1].   Presence of lumps, bumps, and/or masses: No  Presence of pacemaker/defibrillator/electrical device:  No Presence of artificial joints/prosthetic heart valves: No Organ Transplant Patient: Yes - renal transplant x2, now on dialysis _____________________________________________________ PE: There were no vitals filed for this visit. Good spirits and in no acute distress, pleasant and cooperative. Exam of the face, lower leg was performed.  There is a lesion consistent with a Basal Cell Carcinoma (Right medial lower leg)  identified and confirmed as the biopsy site by the patient.   DX Basal Cell Carcinoma (Right medial lower leg)   RX The above diagnosis, tumor biology, and treatment options were discussed. The patient elected to proceed with Electrodesiccation and Curettage. After discussion of the risks and benefits including but not limited to scar, bleeding, nerve damage(and resultant relevant deficit), infection, and pain, informed consent was obtained.  A timeout was performed and the above tumor(s) was treated with electrodesiccation and curettage (see below for operative details).  Warning signs of infection were reviewed.   Recommended that the patient use OTC pain medication as needed for pain.  Biopsies Performed: Discussed with the patient the potential diagnoses and the utility of a tissue biopsy to confirm the diagnosis and further guide management. Site A-D: left lower leg superior; left leg; left lower leg inferior, right forehead Patient informed of the risks (including bleeding, infection and scar) and benefits of the procedure and informed consent was obtained Verbal informed consent and Time Out  performed. Anesthesia was accomplished with 4 mL Lidocaine . The patient underwent biopsy via shave technique for A-C, punch technique for D on forehead. Hemostasis was achieved. Sterile dressing applied. The specimen(s) sent for pathologic examination. The patient tolerated the procedure well. The patient was instructed on post-biopsy care. Wound care instructions were provided in verbal and written forms.    Return pending bx results    Electrodesiccation and Curretage Procedure note  Pre-operative Diagnosis: Basal Cell Carcinoma (Right medial lower leg)  Post-operative Diagnosis: same Anesthesia: Lidocaine  1% with epinephrine   Procedure Details: Patient informed of the risks (including but not limited to bleeding, infection and scar) and benefits of the procedure and informed consent obtained.  Time out performed.  The lesion and surrounding area was given a sterile prep and draped in the usual sterile fashion. Anesthesia was accomplished with 3 cc anesthesia.  Curettage was accomplished down to healthy, viable dermis followed by electrofulguration or cautery.  This was repeated times one additional cycle.   Sterile pressure dressing applied.  The patient tolerated the procedure well.  Complications:none. Estimated Blood loss: <5 ml Post-operative Size: 13 x 15 mm      [1] Allergies Allergen Reactions  . Ferumoxytol  Nausea Only, Other (See Comments) and Shortness Of Breath    Upper body feels like its stinging  BP dropped into the 80's systolic

## 2024-03-15 ENCOUNTER — Telehealth: Payer: Self-pay | Admitting: Radiation Oncology

## 2024-03-15 NOTE — Telephone Encounter (Signed)
 10/6 Called Dr. Concetta office spoke to Trerod in nursing need sx report, recent photos.  He is working on it, will faxed over reports and upload photos in media tab.

## 2024-03-25 ENCOUNTER — Other Ambulatory Visit: Payer: Self-pay

## 2024-03-25 ENCOUNTER — Inpatient Hospital Stay
Admission: RE | Admit: 2024-03-25 | Discharge: 2024-03-25 | Disposition: A | Discharge status: Home / Self Care | Payer: Self-pay | Source: Ambulatory Visit | Attending: Radiation Oncology | Admitting: Radiation Oncology

## 2024-03-25 DIAGNOSIS — C4442 Squamous cell carcinoma of skin of scalp and neck: Secondary | ICD-10-CM

## 2024-04-27 NOTE — Progress Notes (Signed)
 Dermatologic Surgery Note   Referring Physician:   Referral Self No address on file   History: Tumor: Squamous Cell Carcinoma (Right forehead)   Duration:  1-3 years  Previous Treatment: Yes - adjacent to large site of SCC removal and recent Mohs of right lateral forehead.  Primary Symptom:  pain Previous skin cancer: Yes  Surgical History[1]  Allergies[2].   Presence of lumps, bumps, and/or masses:  No  Warfarin: No Aspirin: No Other prescription anticoagulants: No _____________________________________________________ PE: Vitals:   04/27/24 0921  BP: (!) 147/90  Pulse: 94  Resp: 18   Good spirits and in no acute distress, pleasant and cooperative. Exam of the face was performed.  There is a lesion consistent with a Squamous Cell Carcinoma (Right forehead)  identified and confirmed as the biopsy site by the patient.    DX Squamous Cell Carcinoma (Right forehead)   RX The above diagnosis, treatment options, tumor biology, and indications for Mohs surgery being size, site, and indistinct margins were discussed, as well as treatment alternatives. After discussion of the risks and benefits including but not limited to scar, bleeding, nerve damage(and resultant relevant deficit), infection, and pain, informed consent was obtained.  A timeout was performed and the above tumor(s) was excised using Mohs technique followed by repair with Dr. Lauralee. (See operative note for details)  Follow-up:  Patient was provided with verbal and written post-operative instructions including wound care, signs and symptoms of an infection, indications for contacting our offices, and our office contact information including after-hours contacts.  Recommended that the patient use OTC pain medication as needed for pain.  After the appropriate surgical follow-up, the patient will be referred back to their referring physician for continued care. RTC as scheduled for routine checks  Electronically  signed by:  Wanda Dee, MD 04/27/2024 12:29 PM        [1] Past Surgical History: Procedure Laterality Date  . APPENDECTOMY     Procedure: APPENDECTOMY  . AV FISTULA PLACEMENT Left 05/2022   Procedure: AV FISTULA PLACEMENT  . FREE FLAP GRAFT Left 07/18/2022   Procedure: FREE FLAP LATISSIMUS DORSI;  Surgeon: Giles Isaiah Caldron, MD;  Location: [facility] MAIN OR;  Service: ENT;  Laterality: Left;  . HERNIA REPAIR     Procedure: HERNIA REPAIR  . KIDNEY TRANSPLANT Left 11/24/2015   Procedure: DECEASED DONOR KIDNEY TRANSPLANT;  Surgeon: Reyes Gosling, MD;  Location: [facility] MAIN OR;  Service: Transplant;  Laterality: Left;  UNOS ID# [id number] Kidney in room @ 14:49, Off ice @ 17:34, Anastomosis start @ 17:41, Anastomosis end @ 18:26  . KIDNEY TRANSPLANT LIVING DONOR  05/03/2009   Procedure: KIDNEY TRANSPLANT LIVING DONOR  . MALIGNANT SKIN LESION EXCISION Right 07/18/2022   Procedure: EXCISION MALIGNANT LESION right scalp;  Surgeon: Lauralee Fonda Rash, MD;  Location: [facility] MAIN OR;  Service: ENT;  Laterality: Right;  outer table craniectomy    . NECK DISSECTION Right 07/18/2022   Procedure: SELECTIVE NECK DISSECTION;  Surgeon: Lauralee Fonda Rash, MD;  Location: [facility] MAIN OR;  Service: ENT;  Laterality: Right;  . NECK EXPLORATION Right 07/19/2022   Procedure: NECK EXPLORATION;  Surgeon: Giles Isaiah Caldron, MD;  Location: [facility] MAIN OR;  Service: ENT;  Laterality: Right;  . NEPHRECTOMY TRANSPLANTED ORGAN     Procedure: NEPHRECTOMY TRANSPLANTED ORGAN  . PAROTIDECTOMY Right 07/18/2022   Procedure: PAROTIDECTOMY;  Surgeon: Lauralee Fonda Rash, MD;  Location: [facility] MAIN OR;  Service: ENT;  Laterality: Right;  . SKIN BIOPSY    . SKIN  GRAFT Left 07/18/2022   Procedure: SPLIT THICKNESS SKIN GRAFT left flank;  Surgeon: Giles Isaiah Caldron, MD;  Location: [facility] MAIN OR;  Service: ENT;  Laterality: Left;  . SKIN LESION EXCISION Right 04/07/2023   BIOPSY/EXCISION HEAD /SCALP performed by Fonda Laraine Muscat, MD at [facility] OR  [2] Allergies Allergen Reactions  . Ferumoxytol  Nausea Only, Other (See Comments) and Shortness Of Breath    Upper body feels like its stinging  BP dropped into the 80's systolic

## 2024-06-23 NOTE — Progress Notes (Incomplete)
 Pain issues, if any: *** Hair loss Scalp irritation Weight changes, if any: *** Swallowing issues, if any: *** Smoking or chewing tobacco? *** Using fluoride toothpaste daily? *** Last ENT visit was on: *** Other notable issues, if any: ***

## 2024-06-29 ENCOUNTER — Telehealth: Payer: Self-pay | Admitting: Radiation Oncology

## 2024-06-29 NOTE — Telephone Encounter (Signed)
 1/20 Left voicemail with patient and patient's spouse for patient to call our office to be reschedule for follow up appt for 1/26

## 2024-07-01 ENCOUNTER — Telehealth: Payer: Self-pay | Admitting: Radiation Oncology

## 2024-07-01 NOTE — Progress Notes (Incomplete)
 Pain issues, if any: *** Using a feeding tube?: *** Weight changes, if any: *** Swallowing issues, if any: *** Smoking or chewing tobacco? *** Using fluoride toothpaste daily? *** Last ENT visit was on: *** Other notable issues, if any: ***

## 2024-07-01 NOTE — Telephone Encounter (Signed)
 1/22 Left voicemail for patient to call us  back to confirm appt for 1/23 date/time.

## 2024-07-02 ENCOUNTER — Ambulatory Visit
Admission: RE | Admit: 2024-07-02 | Discharge: 2024-07-02 | Disposition: A | Source: Ambulatory Visit | Attending: Radiation Oncology | Admitting: Radiation Oncology

## 2024-07-02 VITALS — BP 134/90 | HR 94 | Temp 97.9°F | Resp 20 | Ht 77.0 in | Wt 281.2 lb

## 2024-07-02 DIAGNOSIS — Z79621 Long term (current) use of calcineurin inhibitor: Secondary | ICD-10-CM | POA: Insufficient documentation

## 2024-07-02 DIAGNOSIS — C4442 Squamous cell carcinoma of skin of scalp and neck: Secondary | ICD-10-CM | POA: Diagnosis present

## 2024-07-02 DIAGNOSIS — L659 Nonscarring hair loss, unspecified: Secondary | ICD-10-CM | POA: Diagnosis not present

## 2024-07-02 DIAGNOSIS — Y842 Radiological procedure and radiotherapy as the cause of abnormal reaction of the patient, or of later complication, without mention of misadventure at the time of the procedure: Secondary | ICD-10-CM | POA: Diagnosis not present

## 2024-07-02 DIAGNOSIS — Z7982 Long term (current) use of aspirin: Secondary | ICD-10-CM | POA: Insufficient documentation

## 2024-07-02 DIAGNOSIS — Z940 Kidney transplant status: Secondary | ICD-10-CM | POA: Insufficient documentation

## 2024-07-02 DIAGNOSIS — Z7952 Long term (current) use of systemic steroids: Secondary | ICD-10-CM | POA: Insufficient documentation

## 2024-07-02 DIAGNOSIS — Z79899 Other long term (current) drug therapy: Secondary | ICD-10-CM | POA: Diagnosis not present

## 2024-07-02 DIAGNOSIS — Z85828 Personal history of other malignant neoplasm of skin: Secondary | ICD-10-CM | POA: Diagnosis not present

## 2024-07-02 DIAGNOSIS — Z923 Personal history of irradiation: Secondary | ICD-10-CM | POA: Diagnosis not present

## 2024-07-03 NOTE — Progress Notes (Signed)
 " Radiation Oncology         [phone] ________________________________  Name: Mario Woods. MRN: [id number]  Date: 07/02/2024  DOB: 09/25/1960  Follow-Up Visit Note  Outpatient  CC: [name], MD (Inactive)  [name], MD  Diagnosis and Prior Radiotherapy:    ICD-10-CM   1. 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 First Treatment Date: 2023-08-06 Last Treatment Date: 2023-09-02 Plan Name: [email or web address] Site: Scalp Technique: IMRT Mode: Photon Dose Per Fraction: 2.4 Gy Prescribed Dose (Delivered / Prescribed): 48 Gy / 48 Gy Prescribed Fxs (Delivered / Prescribed): 20 / 20   Initial post op RT 09/30/2022-11/14/2022              Site: Scalp Technique: 3D Mode: Photon Dose Per Fraction: 2 Gy Prescribed Dose (Delivered / Prescribed): 60 Gy / 60 Gy Prescribed Fxs (Delivered / Prescribed): 30 / 30   CHIEF COMPLAINT: Here for follow-up and surveillance of scalp skin cancer  Narrative:     Mario Woods. Deward is a 64 year old male with a history of scalp cancer who presents for follow-up after recent surgical resections.  He has recurrent right frontal scalp nodules. Mohs surgery in August showed perineural invasion. A subcutaneous recurrence near this site was resected in November with reported extension to skull. MRI and CT in October had evaluated the extent of recurrence. His surgeries have been at [facility] with Dr Venancio and Dr Lauralee  He has partial alopecia from prior scalp radiation and is worried when new pimples develop on his scalp.   He denies any new, recent lumps or bumps or areas of concern  ALLERGIES:  is allergic to feraheme [ferumoxytol ].  Meds: Current Outpatient Medications  Medication Sig  Dispense Refill   aspirin EC 81 MG tablet Take 81 mg by mouth daily.     calcitRIOL (ROCALTROL) 0.25 MCG capsule Take 0.25 mcg by mouth every Monday, Wednesday, and Friday.     cinacalcet  (SENSIPAR ) 90 MG tablet Take 90 mg by mouth daily.     furosemide (LASIX) 40 MG tablet Take 40 mg by mouth daily.     labetalol  (NORMODYNE ) 200 MG tablet Take 400 mg by mouth 3 (three) times daily. (Patient not taking: Reported on 07/09/2023)     MAGNESIUM OXIDE PO Take 500 mg by mouth daily.     Multiple Vitamins-Minerals (MULTIVITAMIN WITH MINERALS) tablet Take 1 tablet by mouth daily.     predniSONE  (DELTASONE ) 5 MG tablet Take 5 mg by mouth daily. (Patient not taking: Reported on 10/01/2023)     sodium bicarbonate 650 MG tablet Take 1,950 mg by mouth 3 (three) times daily. (Patient not taking: Reported on 07/09/2023)     sodium zirconium cyclosilicate (LOKELMA) 10 g PACK packet Take 10 g by mouth every other day. (Patient not taking: Reported on 07/09/2023)     tacrolimus  (PROGRAF ) 1 MG capsule Take 2 mg by mouth 2 (two) times daily. (Patient not taking: Reported on 07/09/2023)     No current facility-administered medications for this encounter.    Physical Findings: The patient is in no acute distress. Patient is alert and oriented.  height is 6' 5 (1.956 m) and weight is 281 lb 3.2 oz (127.6 kg). His temperature is 97.9  F (36.6 C). His blood pressure is 134/90 (abnormal) and his pulse is 94. His respiration is 20 and oxygen saturation is 99%. .    General exam reveals he is alert and oriented in no acute distress  NECK: No cervical, supraclavicular, pre or post auricular lymphadenopathy. SKIN: No signs of skin cancer on scalp, including surgical sites. Partial alopecia on scalp consistent with previous radiation.    Lab Findings: Lab Results  Component Value Date   WBC 8.5 08/27/2022   HGB 9.7 (L) 08/27/2022   HCT 31.7 (L) 08/27/2022   MCV 101.6 (H) 08/27/2022   PLT 241 08/27/2022     Radiographic Findings: No results found.  Impression/Plan:  This is a pleasant 64 yr old patient s/p post-op RT twice for aggressive scalp cancer in the setting of immunosuppression/renal transplant.     Squamous cell carcinoma of the scalp with recent surgical excisions  - Given that our tumor boards are separate, I will personally reach out to Dr. Lauralee and Dr. Venancio regarding their surgical path results ( I cannot find the MOHS report in the EMR) and discuss if there is any radiation therapy necessity. 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 No new skin cancer signs. - Monitor scalp for skin cancer signs.  He continues to follow closely with otolaryngology.  He prefers to wait to see radiation oncology for another 6 months.  I advised him to call us  if there is a gap in his follow-up so he can at least see 1 provider every 3 months.  He is pleased with this plan.   On date of service, in total, I spent 30 minutes on this encounter. Patient was seen in person.  Note signed after encounter date; minutes pertain to date of service, only.  _____________________________________   Lauraine Golden, MD  "

## 2024-07-05 ENCOUNTER — Ambulatory Visit: Admitting: Radiation Oncology

## 2025-01-04 ENCOUNTER — Ambulatory Visit: Admitting: Radiation Oncology
